# Patient Record
Sex: Male | Born: 1945 | Race: Black or African American | Hispanic: No | Marital: Single | Smoking: Never smoker
Health system: Southern US, Community
[De-identification: ages and names within clinical notes are randomized; demographics above are authoritative.]

## PROBLEM LIST (undated history)

## (undated) DIAGNOSIS — M109 Gout, unspecified: Secondary | ICD-10-CM

## (undated) DIAGNOSIS — I639 Cerebral infarction, unspecified: Secondary | ICD-10-CM

## (undated) DIAGNOSIS — K219 Gastro-esophageal reflux disease without esophagitis: Secondary | ICD-10-CM

## (undated) DIAGNOSIS — I2510 Atherosclerotic heart disease of native coronary artery without angina pectoris: Secondary | ICD-10-CM

## (undated) DIAGNOSIS — N183 Chronic kidney disease, stage 3 unspecified: Secondary | ICD-10-CM

## (undated) DIAGNOSIS — K4090 Unilateral inguinal hernia, without obstruction or gangrene, not specified as recurrent: Secondary | ICD-10-CM

## (undated) DIAGNOSIS — M1990 Unspecified osteoarthritis, unspecified site: Secondary | ICD-10-CM

## (undated) DIAGNOSIS — R011 Cardiac murmur, unspecified: Secondary | ICD-10-CM

## (undated) DIAGNOSIS — N281 Cyst of kidney, acquired: Secondary | ICD-10-CM

## (undated) DIAGNOSIS — Z8673 Personal history of transient ischemic attack (TIA), and cerebral infarction without residual deficits: Secondary | ICD-10-CM

## (undated) DIAGNOSIS — G4730 Sleep apnea, unspecified: Secondary | ICD-10-CM

## (undated) DIAGNOSIS — I428 Other cardiomyopathies: Secondary | ICD-10-CM

## (undated) DIAGNOSIS — I10 Essential (primary) hypertension: Secondary | ICD-10-CM

## (undated) DIAGNOSIS — I480 Paroxysmal atrial fibrillation: Secondary | ICD-10-CM

## (undated) DIAGNOSIS — I447 Left bundle-branch block, unspecified: Secondary | ICD-10-CM

## (undated) HISTORY — PX: COLONOSCOPY: SHX174

## (undated) HISTORY — DX: Unspecified osteoarthritis, unspecified site: M19.90

## (undated) HISTORY — PX: HERNIA REPAIR: SHX51

## (undated) HISTORY — DX: Cardiac murmur, unspecified: R01.1

---

## 1998-02-19 ENCOUNTER — Inpatient Hospital Stay (HOSPITAL_COMMUNITY): Admission: EM | Admit: 1998-02-19 | Discharge: 1998-02-21 | Payer: Self-pay | Admitting: Internal Medicine

## 1998-10-30 ENCOUNTER — Ambulatory Visit (HOSPITAL_COMMUNITY): Admission: RE | Admit: 1998-10-30 | Discharge: 1998-10-30 | Payer: Self-pay | Admitting: Family Medicine

## 1998-10-30 ENCOUNTER — Encounter: Payer: Self-pay | Admitting: Family Medicine

## 1998-12-01 ENCOUNTER — Encounter: Payer: Self-pay | Admitting: General Surgery

## 1998-12-01 ENCOUNTER — Ambulatory Visit (HOSPITAL_COMMUNITY): Admission: RE | Admit: 1998-12-01 | Discharge: 1998-12-01 | Payer: Self-pay | Admitting: General Surgery

## 2003-10-22 ENCOUNTER — Emergency Department (HOSPITAL_COMMUNITY): Admission: EM | Admit: 2003-10-22 | Discharge: 2003-10-22 | Payer: Self-pay | Admitting: Emergency Medicine

## 2006-05-16 ENCOUNTER — Emergency Department (HOSPITAL_COMMUNITY): Admission: EM | Admit: 2006-05-16 | Discharge: 2006-05-16 | Payer: Self-pay | Admitting: Emergency Medicine

## 2006-06-14 ENCOUNTER — Ambulatory Visit (HOSPITAL_COMMUNITY): Admission: RE | Admit: 2006-06-14 | Discharge: 2006-06-14 | Payer: Self-pay | Admitting: *Deleted

## 2006-06-17 ENCOUNTER — Inpatient Hospital Stay (HOSPITAL_BASED_OUTPATIENT_CLINIC_OR_DEPARTMENT_OTHER): Admission: RE | Admit: 2006-06-17 | Discharge: 2006-06-17 | Payer: Self-pay | Admitting: Cardiology

## 2009-01-06 ENCOUNTER — Emergency Department (HOSPITAL_COMMUNITY): Admission: EM | Admit: 2009-01-06 | Discharge: 2009-01-06 | Payer: Self-pay | Admitting: Emergency Medicine

## 2011-01-15 NOTE — Cardiovascular Report (Signed)
[name], [name]           ACCOUNT NO.:  [id number]   MEDICAL RECORD NO.:  [id number]          PATIENT TYPE:  OIB   LOCATION:  1965                         FACILITY:  [facility]   PHYSICIAN:  Peter M. Swaziland, M.D.  DATE OF BIRTH:  14-Dec-1945   DATE OF PROCEDURE:  DATE OF DISCHARGE:  06/17/2006                              CARDIAC CATHETERIZATION   INDICATIONS FOR PROCEDURE:  A 65 year old white male with a history of  hypertension and hypercholesterolemia presents predominant symptoms of  fatigue and dyspnea on exertion.  He had a stress Cardiolite study which was  suggestive for anterior apical ischemia.  He had normal left ventricular  function.   PROCEDURE NOTE:   PROCEDURES:  Left heart catheterization and coronary angiography.   EQUIPMENT USED:  A 4-French 4-cm right and left Judkins catheters, a 4-  French pigtail catheter, a 4-French arterial sheath.   MEDICATIONS:  Local anesthesia of 1% Xylocaine, Versed 2 mg IV, contrast 60  mL of Omnipaque.   HEMODYNAMIC DATA:  1. Aortic pressure was 114/67 with a mean of 88-mmHg.  2. Left ventricular pressures was 111 with EDP of 9-mmHg.   ANGIOGRAPHIC DATA:  1. Left coronary artery arises and distributes normally.  The left main      coronary is normal.  2. The left anterior descending artery is a large vessel which wraps      around the apex.  It is normal.  3. The left circumflex coronary is a codominant vessel and is also normal.  4. The right coronary is a relatively small codominant vessel and is also      normal.   LEFT VENTRICULAR ANGIOGRAPHY:  Was not performed due to the patient's  elevated creatinine.   FINAL INTERPRETATION:  1. Normal coronary anatomy.  2. Normal left ventricular filling pressure.   PLAN:  Recommend continued medical therapy.           ______________________________  Peter M. Swaziland, M.D.    PMJ/MEDQ  D:  06/17/2006  T:  06/18/2006  Job:  [id number]   cc:   Royetta Crochet, MD

## 2011-01-15 NOTE — H&P (Signed)
NAMEHORTON, ELLITHORPE           ACCOUNT NO.:  [id number]   MEDICAL RECORD NO.:  [id number]          PATIENT TYPE:  OUT   LOCATION:  PULM                         FACILITY:  [facility]   PHYSICIAN:  Peter M. Swaziland, M.D.  DATE OF BIRTH:  [birth year]   DATE OF ADMISSION:  06/17/2006  DATE OF DISCHARGE:                                HISTORY & PHYSICAL   HISTORY OF PRESENT ILLNESS:  Mr. Ragle is a very pleasant, 65 year old  black male who presents with symptoms of dyspnea and fatigue on exertion.  These symptoms have become acutely worse over the past two to three weeks.  He has also been treated for a sinus infection.  He has noticed some  epigastric discomfort noted when he does any lifting or bending over.  He  was subsequently referred for a stress Cardiolite study on June 10, 2006.  He was able to exercise for seven minutes on the Bruce protocol and was  limited at that time by severe dyspnea and leg fatigue, his heart rate at  that time did not reach his target heart rate and so we switched him to an  adenosine protocol.  His subsequent Cardiolite images demonstrated a  moderate anterior apical reversible defect consistent with ischemia, he had  normal left ventricular function with ejection fraction of 55%.  Given these  findings, it was recommended that we pursue diagnostic cardiac  catheterization.   PAST MEDICAL HISTORY:  1. History of hypertension.  2. Hypercholesterolemia.  3. He has had prior right inguinal hernia repair.  4. He has had a history of sinusitis.  5. Prostatism.   ALLERGIES:  He has no known allergies.   CURRENT MEDICATIONS:  Include:  1. Benicar/HCT 40/25 mg daily.  2. Flomax .4 mg daily.  3. Vytorin 10/40 mg daily.  4. Levitra p.r.n.  5. Aspirin 81 mg daily.   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  He is married, he has two healthy children.  He quit smoking  30 years ago and denies alcohol  use.   FAMILY HISTORY:  Father is age 25 in good health, mother is age 96 and has  hypertension, one brother has hypertension at age 32, one sister age 93 is  in good health.   REVIEW OF SYSTEMS:  He denies any claudication.  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  He denies any orthopnea, PND or increased edema.  He has had no  palpitations.  He has had no recent bowel or bladder complaints.  Other  review of systems are negative.   PHYSICAL EXAMINATION:  GENERAL:  On physical exam, patient is a pleasant  black male in no apparent distress.  VITAL SIGNS:  His weight is 232, blood  pressure is 122/72, pulse is 72 and regular.  HEENT EXAM:  He is normocephalic, atraumatic.  Pupils equal, round and  reactive to light and accommodation.  He does wears glasses.  Oropharynx is  clear.  NECK:  Supple without JVD, adenopathy, thyromegaly or bruits.  LUNGS:  Clear to auscultation and percussion.  CARDIAC EXAM:  Reveals regular rate and rhythm, normal S1-S2 without gallop,  murmur, rub or click.  ABDOMEN:  Soft, nontender without masses or hepatosplenomegaly.  EXTREMITIES:  Femoral and pedal pulses are 2+ and symmetric.  He has no  edema or cyanosis.  NEUROLOGICAL EXAM:  Nonfocal.   LABORATORY DATA:  Resting ECG shows normal sinus rhythm with occasional PVC,  otherwise normal.   Chest x-ray dated May 16, 2006 showed no active disease.   IMPRESSION:  1. Symptoms of dyspnea and fatigue on exertion with atypical chest pain.      Patient has an abnormal Cardiolite study showing evidence of anterior      apical ischemia.  2. Hypertension.  3. Hypercholesterolemia.  4. Prostatism.  5. History of sinusitis.   PLAN:  Will undergo diagnostic cardiac catheterization with further therapy  pending these results.            ______________________________  Peter M. [place]il[place], M.D.     PMJ/MEDQ  D:  06/15/2006  T:  06/15/2006  Job:  [id number]   cc:   Molly Maduro A. Nicholos Johns, M.D.

## 2011-03-01 ENCOUNTER — Emergency Department (HOSPITAL_COMMUNITY): Payer: 59

## 2011-03-01 ENCOUNTER — Emergency Department (HOSPITAL_COMMUNITY)
Admission: EM | Admit: 2011-03-01 | Discharge: 2011-03-01 | Disposition: A | Discharge status: Home / Self Care | Payer: 59 | Attending: Emergency Medicine | Admitting: Emergency Medicine

## 2011-03-01 DIAGNOSIS — M549 Dorsalgia, unspecified: Secondary | ICD-10-CM | POA: Insufficient documentation

## 2011-03-01 DIAGNOSIS — I10 Essential (primary) hypertension: Secondary | ICD-10-CM | POA: Insufficient documentation

## 2011-03-01 DIAGNOSIS — E7800 Pure hypercholesterolemia, unspecified: Secondary | ICD-10-CM | POA: Insufficient documentation

## 2011-03-01 DIAGNOSIS — M1990 Unspecified osteoarthritis, unspecified site: Secondary | ICD-10-CM | POA: Insufficient documentation

## 2013-06-07 ENCOUNTER — Emergency Department (HOSPITAL_COMMUNITY)
Admission: EM | Admit: 2013-06-07 | Discharge: 2013-06-08 | Disposition: A | Discharge status: Home / Self Care | Payer: Medicare Other | Attending: Emergency Medicine | Admitting: Emergency Medicine

## 2013-06-07 ENCOUNTER — Encounter (HOSPITAL_COMMUNITY): Payer: Self-pay | Admitting: Emergency Medicine

## 2013-06-07 DIAGNOSIS — R600 Localized edema: Secondary | ICD-10-CM

## 2013-06-07 DIAGNOSIS — R609 Edema, unspecified: Secondary | ICD-10-CM | POA: Insufficient documentation

## 2013-06-07 DIAGNOSIS — Z79899 Other long term (current) drug therapy: Secondary | ICD-10-CM | POA: Insufficient documentation

## 2013-06-07 DIAGNOSIS — I10 Essential (primary) hypertension: Secondary | ICD-10-CM | POA: Insufficient documentation

## 2013-06-07 DIAGNOSIS — Z792 Long term (current) use of antibiotics: Secondary | ICD-10-CM | POA: Insufficient documentation

## 2013-06-07 HISTORY — DX: Essential (primary) hypertension: I10

## 2013-06-07 NOTE — ED Notes (Addendum)
2-3+ pitting edema bilaterally. Pulses +2/+2 bilaterally. Pt denies cardiac Hx other than HTN

## 2013-06-07 NOTE — ED Notes (Signed)
Pt states that he was seen at his PCP yesterday and was given benadryl and cephalepin for a sore that is on his left foot. Pt states that he has been keeping his foot elevated and it still started swelling today.

## 2013-06-08 ENCOUNTER — Ambulatory Visit (HOSPITAL_COMMUNITY)
Admission: RE | Admit: 2013-06-08 | Discharge: 2013-06-08 | Disposition: A | Discharge status: Home / Self Care | Payer: Medicare Other | Source: Ambulatory Visit | Attending: Family Medicine | Admitting: Family Medicine

## 2013-06-08 ENCOUNTER — Other Ambulatory Visit (HOSPITAL_COMMUNITY): Payer: Self-pay | Admitting: Unknown Physician Specialty

## 2013-06-08 DIAGNOSIS — M7989 Other specified soft tissue disorders: Secondary | ICD-10-CM

## 2013-06-08 LAB — POCT I-STAT, CHEM 8
BUN: 20 mg/dL (ref 6–23)
Calcium, Ion: 1.16 mmol/L (ref 1.13–1.30)
Chloride: 101 mEq/L (ref 96–112)
Glucose, Bld: 106 mg/dL — ABNORMAL HIGH (ref 70–99)
HCT: 43 % (ref 39.0–52.0)
Potassium: 3.6 mEq/L (ref 3.5–5.1)

## 2013-06-08 MED ORDER — ENOXAPARIN SODIUM 120 MG/0.8ML ~~LOC~~ SOLN
1.0000 mg/kg | Freq: Once | SUBCUTANEOUS | Status: AC
  Administered 2013-06-08: 115 mg via SUBCUTANEOUS
  Filled 2013-06-08: qty 0.8

## 2013-06-08 NOTE — ED Provider Notes (Signed)
Medical screening examination/treatment/procedure(s) were conducted as a shared visit with non-physician practitioner(s) and myself.  I personally evaluated the patient during the encounter  L>R LE swelling and soreness, on exam has 2 plus pitting LE edema, calves NT, heart RRR,  lung CTA=. Labs reviewed elevated crt.  MED consult recs Ok to discharge, PT declines admit. Plan [place] and close outpatient follow up.   Sunnie Nielsen, MD 06/08/13 [phone]

## 2013-06-08 NOTE — Progress Notes (Signed)
*  PRELIMINARY RESULTS* Vascular Ultrasound Lower extremity venous duplex has been completed.  Preliminary findings: negative for DVT.   Farrel Demark, RDMS, RVT  06/08/2013, 4:18 PM

## 2013-06-08 NOTE — ED Provider Notes (Signed)
CSN: [id number]     Arrival date & time 06/07/13  2135 History   First MD Initiated Contact with Patient 06/07/13 2339     Chief Complaint  Patient presents with  . Foot Swelling   HPI  History provided by the patient. Patient is a 67 year old African American male with history of hypertension who presents with complaints of worsened and continued left lower extremity swelling. Patient reports first having some swelling of his lower legs and foot on Sunday. He was evaluated by PCP and given a prescription for Keflex and Benadryl. Patient states that he did have improvement of swelling after sleeping and elevating his feet the swelling returned the next day. This evening he reports even worsened swelling of the lower leg with slight ache and soreness. He denies any other specific aggravating or alleviating factors. He denies any skin changes or rash. Denies any fever, chills or sweats. Denies any chest pain or shortness of breath. He reports no urinary changes. No back or flank pains. No recent long travel. No cough or hemoptysis. No history of active cancer. No prior DVT or PEs. No other associated symptoms.    Past Medical History  Diagnosis Date  . Hypertension    History reviewed. No pertinent past surgical history. History reviewed. No pertinent family history. History  Substance Use Topics  . Smoking status: Never Smoker   . Smokeless tobacco: Not on file  . Alcohol Use: No    Review of Systems  Constitutional: Negative for fever, chills and diaphoresis.  Respiratory: Negative for cough and shortness of breath.   Cardiovascular: Positive for leg swelling. Negative for chest pain.  Gastrointestinal: Negative for vomiting and diarrhea.  Skin: Negative for rash.  All other systems reviewed and are negative.    Allergies  Review of patient's allergies indicates no known allergies.  Home Medications   Current Outpatient Rx  Name  Route  Sig  Dispense  Refill  . amLODipine  (NORVASC) 10 MG tablet   Oral   Take 10 mg by mouth daily.         . cephALEXin (KEFLEX) 500 MG capsule   Oral   Take 500 mg by mouth 2 (two) times daily.         . diphenhydrAMINE (BENADRYL) 25 mg capsule   Oral   Take 25 mg by mouth every 6 (six) hours as needed for itching.         . dutasteride (AVODART) 0.5 MG capsule   Oral   Take 0.5 mg by mouth daily.         Marland Kitchen ezetimibe (ZETIA) 10 MG tablet   Oral   Take 10 mg by mouth daily.         Marland Kitchen olmesartan-hydrochlorothiazide (BENICAR HCT) 40-25 MG per tablet   Oral   Take 1 tablet by mouth daily.         . tamsulosin (FLOMAX) 0.4 MG CAPS capsule   Oral   Take 0.4 mg by mouth daily.          BP 160/76  Pulse 87  Temp(Src) 98.2 F (36.8 C) (Oral)  Resp 16  Ht 5\' 9"  (1.753 m)  Wt 250 lb (113.399 kg)  BMI 36.9 kg/m2  SpO2 97% Physical Exam  Nursing note and vitals reviewed. Constitutional: He is oriented to person, place, and time. He appears well-developed and well-nourished. No distress.  HENT:  Head: Normocephalic and atraumatic.  Neck: Normal range of motion. Neck supple. No JVD present.  Cardiovascular: Normal rate and regular rhythm.   No murmur heard. Pulmonary/Chest: Effort normal and breath sounds normal. No respiratory distress. He has no wheezes. He has no rales.  Abdominal: Soft.  Musculoskeletal: He exhibits edema.  Moderate swelling to bilateral lower feet and lower extremities with 2+ pitting edema. Swelling does extend higher on the left lower extremity. There is no significant tenderness. No pain or tenderness along the calf. Normal dorsal pedal pulses and sensation in the foot. Skin normal without erythema or induration.  Neurological: He is alert and oriented to person, place, and time.  Skin: Skin is warm.  Psychiatric: He has a normal mood and affect. His behavior is normal.    ED Course  Procedures  DIAGNOSTIC STUDIES: Oxygen Saturation is 97% on room air.    COORDINATION OF  CARE:  Nursing notes reviewed. Vital signs reviewed. Initial pt interview and examination performed.   Patient with no previous lab testing in the computer system. Patient today has elevated creatinine level of 2.0. He reports no knowledge of any history of kidney insufficiency. Patient's case and lab findings were discussed with attending physician. At this time with his acute lower x-ray swelling is unclear if he may be having some acute renal problems or if this is his baseline. Plan to discuss the case with internal medicine.  I spoke with Dr. Toniann Fail with [facility] hospital[facility]. He does not necessarily recommend any adjustments of patient's blood pressure medications at this time. He does feel patient will require close repeat testing of BUN and creatinine. Patient may also benefit from outpatient cardiac workup to rule out CHF. Currently patient has no clinical concerns for CHF. Given that patient has slightly worsened swelling of the left lower extremity there may be possible concerns for DVT and we will give dose of Lovenox and schedule followup vascular ultrasound.  I did discuss the findings with the patient and discussed options for possible admission for further evaluation. Patient does not wish to stay in the hospital. He does followup with Dr. Nicholos Johns and wishes to have further testing outpatient. He is in agreement to receive Lovenox here and followup later in the morning for a vascular Doppler ultrasounds of lower legs. I did give strict return precautions as well as risks for worsening condition. Patient expressed his understanding.   Treatment plan initiated:  Medications  enoxaparin (LOVENOX) injection 115 mg (115 mg Subcutaneous Given 06/08/13 0210)       Results for orders placed during the hospital encounter of 06/07/13  POCT I-STAT, CHEM 8      Result Value Range   Sodium 140  135 - 145 mEq/L   Potassium 3.6  3.5 - 5.1 mEq/L   Chloride 101  96 - 112 mEq/L   BUN 20  6 - 23  mg/dL   Creatinine, Ser 7.82 (*) 0.50 - 1.35 mg/dL   Glucose, Bld 956 (*) 70 - 99 mg/dL   Calcium, Ion 2.13  0.86 - 1.30 mmol/L   TCO2 27  0 - 100 mmol/L   Hemoglobin 14.6  13.0 - 17.0 g/dL   HCT 57.8  46.9 - 62.9 %       MDM   1. Lower extremity edema       Angus Seller, PA-C 06/08/13 [id number]

## 2013-08-20 ENCOUNTER — Ambulatory Visit
Admission: RE | Admit: 2013-08-20 | Discharge: 2013-08-20 | Disposition: A | Discharge status: Home / Self Care | Payer: Medicare Other | Source: Ambulatory Visit | Attending: Family Medicine | Admitting: Family Medicine

## 2013-08-20 ENCOUNTER — Other Ambulatory Visit: Payer: Self-pay | Admitting: Family Medicine

## 2013-08-20 DIAGNOSIS — S8002XS Contusion of left knee, sequela: Secondary | ICD-10-CM

## 2013-08-20 DIAGNOSIS — T1490XA Injury, unspecified, initial encounter: Secondary | ICD-10-CM

## 2013-08-20 DIAGNOSIS — R52 Pain, unspecified: Secondary | ICD-10-CM

## 2013-08-23 ENCOUNTER — Emergency Department (HOSPITAL_COMMUNITY)
Admission: EM | Admit: 2013-08-23 | Discharge: 2013-08-23 | Disposition: A | Discharge status: Home / Self Care | Payer: Medicare Other | Source: Home / Self Care

## 2013-08-23 ENCOUNTER — Ambulatory Visit (INDEPENDENT_AMBULATORY_CARE_PROVIDER_SITE_OTHER): Payer: Medicare Other | Admitting: Family Medicine

## 2013-08-23 ENCOUNTER — Ambulatory Visit (HOSPITAL_COMMUNITY)
Admit: 2013-08-23 | Discharge: 2013-08-23 | Disposition: A | Discharge status: Home / Self Care | Payer: Medicare Other | Attending: Family Medicine | Admitting: Family Medicine

## 2013-08-23 VITALS — BP 155/87 | HR 71 | Temp 98.2°F | Resp 16 | Ht 69.0 in | Wt 239.0 lb

## 2013-08-23 DIAGNOSIS — R51 Headache: Secondary | ICD-10-CM | POA: Insufficient documentation

## 2013-08-23 DIAGNOSIS — R42 Dizziness and giddiness: Secondary | ICD-10-CM | POA: Insufficient documentation

## 2013-08-23 NOTE — Progress Notes (Signed)
[facility] [address] [place] [address] [phone]  Date:  08/23/2013   Name:  [name] [name]   DOB:  1946-05-20   MRN:  [id number]  PCP:  Lolita Patella, MD    Chief Complaint: Headache   History of Present Illness:  [name] [name] is a 67 y.o. very pleasant male patient who presents with the following:  He is here today with headaches.  He noted onset yesterday.  He noted it when he woke up and it is sill there today His PCP is Dr. Nicholos Johns - he saw him on Monday and was started on prednisone and vicodin for a knee pain.  He fell a few days ago and hurt his knee.   He thinks that he got a virus just over a week ago after going to the grocery store.  He noted that "everything started spinning around" which is "how I know I got a germ virus."  To treat this he took some dulcolax to "clean myself out."   He has not taken any medication since yesterday.  He did not take his prednsisone or vicodin today because he was not sure if he should He notes pain when he bends down.  He has pain in the front of his head, in his sinuses He noted a subjective fever a week ago, now resolved  He states he had a CT "a long time ago" of his head- in fact he has had 2 CTs of his head in 2007 and 2010 for severe HA.  He did not seem to remember about these and states that "I never get headaches."  Asked in detail about his current HA and he does feel it is an unusual and severe HA for him, and he would like to pursue imaging of his head   He has not noted any weakness, numbness, hearing or vision change.  He did feel off balance over a week ago "when I had that germ virus" but this is now resolved  He has not taken his BP medications yet today "because I was afraid to."  There are no active problems to display for this patient.   Past Medical History  Diagnosis Date  . Hypertension     No past surgical history on file.  History  Substance Use Topics   . Smoking status: Never Smoker   . Smokeless tobacco: Not on file  . Alcohol Use: No    No family history on file.  No Known Allergies  Medication list has been reviewed and updated.  Current Outpatient Prescriptions on File Prior to Visit  Medication Sig Dispense Refill  . diphenhydrAMINE (BENADRYL) 25 mg capsule Take 25 mg by mouth every 6 (six) hours as needed for itching.      . ezetimibe (ZETIA) 10 MG tablet Take 10 mg by mouth daily.      Marland Kitchen olmesartan-hydrochlorothiazide (BENICAR HCT) 40-25 MG per tablet Take 1 tablet by mouth daily.      . tamsulosin (FLOMAX) 0.4 MG CAPS capsule Take 0.4 mg by mouth daily.      Marland Kitchen amLODipine (NORVASC) 10 MG tablet Take 10 mg by mouth daily.      . cephALEXin (KEFLEX) 500 MG capsule Take 500 mg by mouth 2 (two) times daily.      Marland Kitchen dutasteride (AVODART) 0.5 MG capsule Take 0.5 mg by mouth daily.       No current facility-administered medications on file prior to visit.  Review of Systems:  As per HPI- otherwise negative.   Physical Examination: Filed Vitals:   08/23/13 1225  BP: 155/87  Pulse: 71  Temp: 98.2 F (36.8 C)  Resp: 16   Filed Vitals:   08/23/13 1225  Height: 5\' 9"  (1.753 m)  Weight: 239 lb (108.41 kg)   Body mass index is 35.28 kg/(m^2). Ideal Body Weight: Weight in (lb) to have BMI = 25: 168.9  GEN: WDWN, NAD, Non-toxic, A & O x 3, obese, looks well HEENT: Atraumatic, Normocephalic. Neck supple. No masses, No LAD.  Bilateral TM wnl, oropharynx normal.  PEERL,EOMI.   Ears and Nose: No external deformity. CV: RRR, No M/G/R. No JVD. No thrill. No extra heart sounds. PULM: CTA B, no wheezes, crackles, rhonchi. No retractions. No resp. distress. No accessory muscle use. ABD: S, NT, ND EXTR: No c/c/e NEURO Normal gait. Neuro exam WNL- normal strength, sensation and DTR all extremities.  Normal romberg.  PSYCH: Normally interactive. Conversant. Not depressed or anxious appearing.  Calm demeanor.    Assessment and  Plan: Headache(784.0) - Plan: CT Head Wo Contrast  [name] [name] is here today with what he states is a very severe and unusual headache.  He gives a rather confusing history, but it seems he was ill a week ago, then saw his PCP on Monday (today is Thursday) and was given prednisone and vicodin for a knee problem.  Today he has complaint of HA which is very worrisome to him, and he would like to have imaging done today  He had had 2 head CTs in the past- the history for both states HA.  His earlier CT in 2007 does show a possible infarct of undetermined age.  Will send him for a CT of his head and call him with results later this evening  Signed Abbe Amsterdam, MD  Sent for CT head today: CT HEAD WITHOUT CONTRAST  TECHNIQUE: Contiguous axial images were obtained from the base of the skull through the vertex without intravenous contrast.  COMPARISON: Head CT 01/06/2009  FINDINGS: The ventricles and sulci are within normal limits for age. There is no evidence of acute infarct, intracranial hemorrhage, mass, midline shift, or extra-axial collection. The orbits are unremarkable. The visualized paranasal sinuses and mastoid air cells are clear. There is no evidence of acute fracture.  IMPRESSION: Unremarkable head CT.  Called and let him know Ct is negative.  He seems to have complaint most of sinus pressure.  Advised him that the prednisone will actually be good for sinus pressure, and his pain medication is likely to help his HA as well.  Asked him to please let us know if he has any other concerns or if his HA does not get better soon

## 2013-08-23 NOTE — Patient Instructions (Signed)
I will give you a call regarding the results of your head CT when they come in later today.

## 2013-08-27 ENCOUNTER — Emergency Department (HOSPITAL_COMMUNITY)
Admission: EM | Admit: 2013-08-27 | Discharge: 2013-08-27 | Disposition: A | Discharge status: Home / Self Care | Payer: Medicare Other | Attending: Emergency Medicine | Admitting: Emergency Medicine

## 2013-08-27 ENCOUNTER — Encounter (HOSPITAL_COMMUNITY): Payer: Self-pay | Admitting: Emergency Medicine

## 2013-08-27 DIAGNOSIS — M531 Cervicobrachial syndrome: Secondary | ICD-10-CM | POA: Insufficient documentation

## 2013-08-27 DIAGNOSIS — IMO0002 Reserved for concepts with insufficient information to code with codable children: Secondary | ICD-10-CM | POA: Insufficient documentation

## 2013-08-27 DIAGNOSIS — R112 Nausea with vomiting, unspecified: Secondary | ICD-10-CM | POA: Insufficient documentation

## 2013-08-27 DIAGNOSIS — M109 Gout, unspecified: Secondary | ICD-10-CM | POA: Insufficient documentation

## 2013-08-27 DIAGNOSIS — Z791 Long term (current) use of non-steroidal anti-inflammatories (NSAID): Secondary | ICD-10-CM | POA: Insufficient documentation

## 2013-08-27 DIAGNOSIS — M25469 Effusion, unspecified knee: Secondary | ICD-10-CM | POA: Insufficient documentation

## 2013-08-27 DIAGNOSIS — R51 Headache: Secondary | ICD-10-CM | POA: Insufficient documentation

## 2013-08-27 DIAGNOSIS — R519 Headache, unspecified: Secondary | ICD-10-CM

## 2013-08-27 DIAGNOSIS — G44209 Tension-type headache, unspecified, not intractable: Secondary | ICD-10-CM

## 2013-08-27 DIAGNOSIS — R42 Dizziness and giddiness: Secondary | ICD-10-CM | POA: Insufficient documentation

## 2013-08-27 DIAGNOSIS — I10 Essential (primary) hypertension: Secondary | ICD-10-CM | POA: Insufficient documentation

## 2013-08-27 DIAGNOSIS — M5481 Occipital neuralgia: Secondary | ICD-10-CM

## 2013-08-27 DIAGNOSIS — Z79899 Other long term (current) drug therapy: Secondary | ICD-10-CM | POA: Insufficient documentation

## 2013-08-27 MED ORDER — METHOCARBAMOL 500 MG PO TABS: 500.0000 mg | ORAL_TABLET | Freq: Three times a day (TID) | ORAL | Status: DC | PRN

## 2013-08-27 MED ORDER — NAPROXEN 500 MG PO TABS: 500.0000 mg | ORAL_TABLET | Freq: Two times a day (BID) | ORAL | Status: DC

## 2013-08-27 NOTE — ED Provider Notes (Signed)
CSN: [id number]     Arrival date & time 08/27/13  1111 History   First MD Initiated Contact with Patient 08/27/13 1216     Chief Complaint  Patient presents with  . Headache      HPI  Patient presents with a chief complaint of a headache. About 10 days ago he had an episode of what sounds like vertigo. His room was spinning. He vomited twice. Next they went to see his physician because he fell hurt his knee. His vertigo and nausea had resolved. He did not have headache. He has some swelling in his knee. He is placed on prednisone because of his history of gout, and given oxycodone. He developed headache within 24 hours, thus he stopped the prednisone and hydrocodone. Headache has continued it is been present daily. He was seen in the outpatient urgent care center and had an outpatient CT scan obtained 2 days ago that was normal. His headache persists. He describes it as a headache primarily on the right side. His reproducible with palpation of the back of his head. No vision changes. No difficult with swallowing vision no additional vertigo. No sinus pain pressure congestion or fever.  Past Medical History  Diagnosis Date  . Hypertension    History reviewed. No pertinent past surgical history. History reviewed. No pertinent family history. History  Substance Use Topics  . Smoking status: Never Smoker   . Smokeless tobacco: Not on file  . Alcohol Use: No    Review of Systems  Constitutional: Negative for fever, chills, diaphoresis, appetite change and fatigue.  HENT: Negative for mouth sores, sore throat and trouble swallowing.   Eyes: Negative for visual disturbance.  Respiratory: Negative for cough, chest tightness, shortness of breath and wheezing.   Cardiovascular: Negative for chest pain.  Gastrointestinal: Positive for nausea and vomiting. Negative for abdominal pain, diarrhea and abdominal distention.  Endocrine: Negative for polydipsia, polyphagia and polyuria.    Genitourinary: Negative for dysuria, frequency and hematuria.  Musculoskeletal: Negative for gait problem.  Skin: Negative for color change, pallor and rash.  Neurological: Positive for dizziness and headaches. Negative for syncope and light-headedness.  Hematological: Does not bruise/bleed easily.  Psychiatric/Behavioral: Negative for behavioral problems and confusion.    Allergies  Review of patient's allergies indicates no known allergies.  Home Medications   Current Outpatient Rx  Name  Route  Sig  Dispense  Refill  . allopurinol (ZYLOPRIM) 300 MG tablet   Oral   Take 300 mg by mouth daily.         . bisacodyl (DULCOLAX) 5 MG EC tablet   Oral   Take 5 mg by mouth daily as needed for moderate constipation.         . colchicine 0.6 MG tablet   Oral   Take 0.6 mg by mouth daily as needed (for gout).          Marland Kitchen diphenhydrAMINE (BENADRYL) 25 mg capsule   Oral   Take 25 mg by mouth every 6 (six) hours as needed for itching.         . ezetimibe (ZETIA) 10 MG tablet   Oral   Take 10 mg by mouth daily.         Marland Kitchen HYDROcodone-acetaminophen (NORCO/VICODIN) 5-325 MG per tablet   Oral   Take 1-2 tablets by mouth every 6 (six) hours as needed for moderate pain.         Marland Kitchen olmesartan-hydrochlorothiazide (BENICAR HCT) 40-25 MG per tablet   Oral  Take 1 tablet by mouth daily.         . predniSONE (DELTASONE) 20 MG tablet   Oral   Take 20 mg by mouth daily with breakfast. 3-3-3-2-2-2-1-1-1         . tamsulosin (FLOMAX) 0.4 MG CAPS capsule   Oral   Take 0.4 mg by mouth daily.         . methocarbamol (ROBAXIN) 500 MG tablet   Oral   Take 1 tablet (500 mg total) by mouth 3 (three) times daily between meals as needed.   20 tablet   0   . naproxen (NAPROSYN) 500 MG tablet   Oral   Take 1 tablet (500 mg total) by mouth 2 (two) times daily.   30 tablet   0    BP 141/94  Pulse 70  Temp(Src) 98.1 F (36.7 C) (Oral)  Resp 16  Wt 243 lb 3.2 oz (110.315  kg)  SpO2 96% Physical Exam  Constitutional: He is oriented to person, place, and time. He appears well-developed and well-nourished. No distress.  HENT:  Head: Normocephalic.    There is tenderness in the right posterior inferior occiput reproduces his headache was palpated. Point tender.  Normal posterior pharynx.  Eyes: Conjunctivae are normal. Pupils are equal, round, and reactive to light. No scleral icterus.  Neck: Normal range of motion. Neck supple. No thyromegaly present.  Supple neck. No tenderness in the posterior neck.  Cardiovascular: Normal rate and regular rhythm.  Exam reveals no gallop and no friction rub.   No murmur heard. Pulmonary/Chest: Effort normal and breath sounds normal. No respiratory distress. He has no wheezes. He has no rales.  Normal bilateral breast exam. He does not have mass or fullness. No nipple or skin changes. No adenopathy neck supple.  Abdominal: Soft. Bowel sounds are normal. He exhibits no distension. There is no tenderness. There is no rebound.  Musculoskeletal: Normal range of motion.  Neurological: He is alert and oriented to person, place, and time.  Skin: Skin is warm and dry. No rash noted.  Psychiatric: He has a normal mood and affect. His behavior is normal.    ED Course  Procedures (including critical care time) Labs Review Labs Reviewed - No data to display Imaging Review No results found.  EKG Interpretation   None       MDM   1. Headache   2. Muscle tension headache   3. Occipital neuralgia    Patient has a headache that is reproducible with palpation of his right posterior occiput. His given a local injection of 5 cc of 2% lidocaine. And 10 minutes his headache is completely resolved. This is consistent with an occipital neuralgia. Some component of the muscle tension headache as well. His gases a sensation of a tickle in his throat. His normal pharyngeal exam. Not his reflux symptoms. He is on an ACE inhibitor.  Vascular discuss this with his physician as as being a potential cause. He also describes sensation of tenderness in his right breast for several months. His normal breast exam without masses or pneumonia. Have asked him to discuss this  with his physician. At this this is a potential side effect.  His headache he'll be placed on some naproxen and Robaxin.    Rolland Porter, MD 08/27/13 1322

## 2013-08-27 NOTE — ED Notes (Signed)
Pt states he's had a headache and a feeling that "my body doesn't feel right" for several days.  Pt seen at [facility] on 12/25.  Per pt they did a CT, but found nothing wrong.  Pt presents today with continued symptoms and states "something is wrong".  Pt alert and oriented and in NAD at this time.

## 2013-08-27 NOTE — ED Notes (Signed)
PT reports one episode of vertigo on 08/15/13 with fall, skinning L knee and L great toe. PT denies further episodes of vertigo. PT reports emesis x1 on 08/15/13. PT reports HA onset on 08/20/13 and states he began to take prednisone for injury to knee and great toe. PT saw primary care on 12/22 and was x-ray'd and pain medication for knee and toe. PT returned to primary care on 12/24 and was sent to [facility] hospital for CT scan. PT was given pain medication for mgmt of HA. PT reports HA 7/10 at temples. PT also reports feeling "something in throat" after having eaten dinner last night. PT also mentioned that he has noticed gynecomastia with onset in June/July 2014 and R breast tenderness.

## 2013-09-13 ENCOUNTER — Ambulatory Visit
Admission: RE | Admit: 2013-09-13 | Discharge: 2013-09-13 | Disposition: A | Discharge status: Home / Self Care | Payer: Medicare Other | Source: Ambulatory Visit | Attending: Family Medicine | Admitting: Family Medicine

## 2013-09-13 ENCOUNTER — Other Ambulatory Visit: Payer: Self-pay | Admitting: Family Medicine

## 2013-09-13 DIAGNOSIS — M79609 Pain in unspecified limb: Secondary | ICD-10-CM

## 2014-10-29 ENCOUNTER — Other Ambulatory Visit: Payer: Self-pay | Admitting: Family Medicine

## 2014-10-29 ENCOUNTER — Ambulatory Visit
Admission: RE | Admit: 2014-10-29 | Discharge: 2014-10-29 | Disposition: A | Discharge status: Home / Self Care | Payer: Medicare Other | Source: Ambulatory Visit | Attending: Family Medicine | Admitting: Family Medicine

## 2014-10-29 DIAGNOSIS — M545 Low back pain: Secondary | ICD-10-CM

## 2014-11-30 ENCOUNTER — Encounter (HOSPITAL_COMMUNITY): Payer: Self-pay | Admitting: *Deleted

## 2014-11-30 ENCOUNTER — Emergency Department (HOSPITAL_COMMUNITY)
Admission: EM | Admit: 2014-11-30 | Discharge: 2014-11-30 | Disposition: A | Discharge status: Home / Self Care | Payer: Medicare Other | Attending: Emergency Medicine | Admitting: Emergency Medicine

## 2014-11-30 ENCOUNTER — Emergency Department (HOSPITAL_COMMUNITY): Payer: Medicare Other

## 2014-11-30 DIAGNOSIS — I10 Essential (primary) hypertension: Secondary | ICD-10-CM | POA: Insufficient documentation

## 2014-11-30 DIAGNOSIS — R05 Cough: Secondary | ICD-10-CM | POA: Diagnosis present

## 2014-11-30 DIAGNOSIS — J3489 Other specified disorders of nose and nasal sinuses: Secondary | ICD-10-CM | POA: Diagnosis not present

## 2014-11-30 DIAGNOSIS — R600 Localized edema: Secondary | ICD-10-CM | POA: Insufficient documentation

## 2014-11-30 DIAGNOSIS — R509 Fever, unspecified: Secondary | ICD-10-CM | POA: Diagnosis not present

## 2014-11-30 DIAGNOSIS — R059 Cough, unspecified: Secondary | ICD-10-CM

## 2014-11-30 DIAGNOSIS — Z79899 Other long term (current) drug therapy: Secondary | ICD-10-CM | POA: Insufficient documentation

## 2014-11-30 LAB — CBC WITH DIFFERENTIAL/PLATELET
Basophils Absolute: 0 10*3/uL (ref 0.0–0.1)
Basophils Relative: 0 % (ref 0–1)
EOS ABS: 0.2 10*3/uL (ref 0.0–0.7)
Eosinophils Relative: 4 % (ref 0–5)
HEMATOCRIT: 39.7 % (ref 39.0–52.0)
HEMOGLOBIN: 13 g/dL (ref 13.0–17.0)
LYMPHS ABS: 2.1 10*3/uL (ref 0.7–4.0)
LYMPHS PCT: 40 % (ref 12–46)
MCH: 29.2 pg (ref 26.0–34.0)
MCHC: 32.7 g/dL (ref 30.0–36.0)
MCV: 89.2 fL (ref 78.0–100.0)
Monocytes Absolute: 0.8 10*3/uL (ref 0.1–1.0)
Monocytes Relative: 14 % — ABNORMAL HIGH (ref 3–12)
NEUTROS ABS: 2.2 10*3/uL (ref 1.7–7.7)
NEUTROS PCT: 42 % — AB (ref 43–77)
Platelets: 205 10*3/uL (ref 150–400)
RBC: 4.45 MIL/uL (ref 4.22–5.81)
RDW: 14.5 % (ref 11.5–15.5)
WBC: 5.3 10*3/uL (ref 4.0–10.5)

## 2014-11-30 LAB — COMPREHENSIVE METABOLIC PANEL
ALK PHOS: 49 U/L (ref 39–117)
ALT: 12 U/L (ref 0–53)
ANION GAP: 9 (ref 5–15)
AST: 22 U/L (ref 0–37)
Albumin: 3.3 g/dL — ABNORMAL LOW (ref 3.5–5.2)
BILIRUBIN TOTAL: 0.5 mg/dL (ref 0.3–1.2)
BUN: 15 mg/dL (ref 6–23)
CO2: 27 mmol/L (ref 19–32)
Calcium: 8.4 mg/dL (ref 8.4–10.5)
Chloride: 99 mmol/L (ref 96–112)
Creatinine, Ser: 1.67 mg/dL — ABNORMAL HIGH (ref 0.50–1.35)
GFR calc Af Amer: 47 mL/min — ABNORMAL LOW (ref >90)
GFR, EST NON AFRICAN AMERICAN: 41 mL/min — AB (ref >90)
GLUCOSE: 89 mg/dL (ref 70–99)
POTASSIUM: 3.7 mmol/L (ref 3.5–5.1)
SODIUM: 135 mmol/L (ref 135–145)
Total Protein: 6.5 g/dL (ref 6.0–8.3)

## 2014-11-30 MED ORDER — AZITHROMYCIN 250 MG PO TABS: 250.0000 mg | ORAL_TABLET | Freq: Every day | ORAL | Status: DC

## 2014-11-30 MED ORDER — HYDROCODONE-HOMATROPINE 5-1.5 MG/5ML PO SYRP: 5.0000 mL | ORAL_SOLUTION | Freq: Four times a day (QID) | ORAL | Status: DC | PRN

## 2014-11-30 NOTE — Discharge Instructions (Signed)
1. Medications: hycodan, azithromycin, mucinex, usual home medications 2. Treatment: rest, drink plenty of fluids, take tylenol or ibuprofen for fever control 3. Follow Up: Please followup with your primary doctor in 3 days for discussion of your diagnoses and further evaluation after today's visit; if you do not have a primary care doctor use the resource guide provided to find one; Return to the ER for high fevers, difficulty breathing or other concerning symptoms

## 2014-11-30 NOTE — ED Provider Notes (Signed)
CSN: [id number]     Arrival date & time 11/30/14  1646 History   First MD Initiated Contact with Patient 11/30/14 1742     Chief Complaint  Patient presents with  . Cough     (Consider location/radiation/quality/duration/timing/severity/associated sxs/prior Treatment) Patient is a 69 y.o. male presenting with cough. The history is provided by the patient and medical records. No language interpreter was used.  Cough Associated symptoms: chest pain ( only with cough) and fever   Associated symptoms: no diaphoresis, no headaches, no rash, no shortness of breath and no wheezing      ZAYDE STROUPE is a 69 y.o. male  with a hx of HTN presents to the Emergency Department complaining of gradual, persistent, progressively worsening cough with associated brown sputum onset last night.  Pt reports his wife is sick with similar symptoms.  He reports receiving his flu and PNA shot this year.  He reports no treatments PTA and no aggravating or alleviating factors.  Patient denies leg swelling, recent travel, recent surgery or fracture, hemoptysis or exogenous estrogen usage. Pt denies chills, headache, neck pain, neck stiffness, abdominal pain, nausea, vomiting, diarrhea, weakness, dizziness, syncope, dysuria.     Past Medical History  Diagnosis Date  . Hypertension    History reviewed. No pertinent past surgical history. No family history on file. History  Substance Use Topics  . Smoking status: Never Smoker   . Smokeless tobacco: Not on file  . Alcohol Use: No    Review of Systems  Constitutional: Positive for fever. Negative for diaphoresis, appetite change, fatigue and unexpected weight change.  HENT: Negative for mouth sores.   Eyes: Negative for visual disturbance.  Respiratory: Positive for cough. Negative for chest tightness, shortness of breath and wheezing.   Cardiovascular: Positive for chest pain ( only with cough).  Gastrointestinal: Negative for nausea, vomiting, abdominal  pain, diarrhea and constipation.  Endocrine: Negative for polydipsia, polyphagia and polyuria.  Genitourinary: Negative for dysuria, urgency, frequency and hematuria.  Musculoskeletal: Negative for back pain and neck stiffness.  Skin: Negative for rash.  Allergic/Immunologic: Negative for immunocompromised state.  Neurological: Negative for syncope, light-headedness and headaches.  Hematological: Does not bruise/bleed easily.  Psychiatric/Behavioral: Negative for sleep disturbance. The patient is not nervous/anxious.       Allergies  Review of patient's allergies indicates no known allergies.  Home Medications   Prior to Admission medications   Medication Sig Start Date End Date Taking? Authorizing Provider  allopurinol (ZYLOPRIM) 300 MG tablet Take 300 mg by mouth daily.   Yes Historical Provider, MD  amLODipine (NORVASC) 10 MG tablet Take 10 mg by mouth daily. 09/13/14  Yes Historical Provider, MD  Colesevelam HCl (WELCHOL PO) Take 1 tablet by mouth daily as needed (high cholesterol).   Yes Historical Provider, MD  dutasteride (AVODART) 0.5 MG capsule Take 0.5 mg by mouth at bedtime.  10/29/14  Yes Historical Provider, MD  irbesartan (AVAPRO) 300 MG tablet Take 300 mg by mouth daily. 09/13/14  Yes Historical Provider, MD  pravastatin (PRAVACHOL) 40 MG tablet Take 40 mg by mouth at bedtime.  09/13/14  Yes Historical Provider, MD  PREDNISONE, PAK, PO Take 1 tablet by mouth daily. 5th day of dose pak   Yes Historical Provider, MD  tamsulosin (FLOMAX) 0.4 MG CAPS capsule Take 0.4 mg by mouth daily.   Yes Historical Provider, MD  triamcinolone (NASACORT) 55 MCG/ACT AERO nasal inhaler Place 2 sprays into the nose at bedtime as needed (congestion).   Yes  Historical Provider, MD  azithromycin (ZITHROMAX) 250 MG tablet Take 1 tablet (250 mg total) by mouth daily. Take first 2 tablets together, then 1 every day until finished. 11/30/14   Deaundra Kutzer, PA-C  bisacodyl (DULCOLAX) 5 MG EC tablet  Take 5 mg by mouth daily as needed for moderate constipation.    Historical Provider, MD  colchicine 0.6 MG tablet Take 0.6 mg by mouth daily as needed (for gout).     Historical Provider, MD  diphenhydrAMINE (BENADRYL) 25 mg capsule Take 25 mg by mouth every 6 (six) hours as needed for itching.    Historical Provider, MD  HYDROcodone-homatropine (HYCODAN) 5-1.5 MG/5ML syrup Take 5 mLs by mouth every 6 (six) hours as needed for cough. 11/30/14   Jahni Nazar, PA-C  methocarbamol (ROBAXIN) 500 MG tablet Take 1 tablet (500 mg total) by mouth 3 (three) times daily between meals as needed. Patient taking differently: Take 500 mg by mouth 3 (three) times daily between meals as needed for muscle spasms.  08/27/13   Tanna Furry, MD  naproxen (NAPROSYN) 500 MG tablet Take 1 tablet (500 mg total) by mouth 2 (two) times daily. Patient not taking: Reported on 11/30/2014 08/27/13   Tanna Furry, MD   BP 144/79 mmHg  Pulse 64  Temp(Src) 98.4 F (36.9 C) (Oral)  Resp 16  SpO2 97% Physical Exam  Constitutional: He is oriented to person, place, and time. He appears well-developed and well-nourished. No distress.  Awake, alert, nontoxic appearance  HENT:  Head: Normocephalic and atraumatic.  Right Ear: Tympanic membrane, external ear and ear canal normal.  Left Ear: Tympanic membrane, external ear and ear canal normal.  Nose: Mucosal edema and rhinorrhea present. No epistaxis. Right sinus exhibits no maxillary sinus tenderness and no frontal sinus tenderness. Left sinus exhibits no maxillary sinus tenderness and no frontal sinus tenderness.  Mouth/Throat: Uvula is midline, oropharynx is clear and moist and mucous membranes are normal. Mucous membranes are not pale and not cyanotic. No oropharyngeal exudate, posterior oropharyngeal edema, posterior oropharyngeal erythema or tonsillar abscesses.  Eyes: Conjunctivae are normal. Pupils are equal, round, and reactive to light. No scleral icterus.  Neck: Normal  range of motion and full passive range of motion without pain. Neck supple.  Cardiovascular: Normal rate, regular rhythm and intact distal pulses.   Pulmonary/Chest: Effort normal and breath sounds normal. No stridor. No respiratory distress. He has no wheezes.  Equal chest expansion Congested cough  Abdominal: Soft. Bowel sounds are normal. He exhibits no mass. There is no tenderness. There is no rebound and no guarding.  Musculoskeletal: Normal range of motion. He exhibits no edema.  Lymphadenopathy:    He has no cervical adenopathy.  Neurological: He is alert and oriented to person, place, and time.  Speech is clear and goal oriented Moves extremities without ataxia  Skin: Skin is warm and dry. No rash noted. He is not diaphoretic.  Psychiatric: He has a normal mood and affect.  Nursing note and vitals reviewed.   ED Course  Procedures (including critical care time) Labs Review Labs Reviewed  CBC WITH DIFFERENTIAL/PLATELET - Abnormal; Notable for the following:    Neutrophils Relative % 42 (*)    Monocytes Relative 14 (*)    All other components within normal limits  COMPREHENSIVE METABOLIC PANEL - Abnormal; Notable for the following:    Creatinine, Ser 1.67 (*)    Albumin 3.3 (*)    GFR calc non Af Amer 41 (*)    GFR calc Af  Amer 77 (*)    All other components within normal limits    Imaging Review Dg Chest 2 View  11/30/2014   CLINICAL DATA:  Cough and shortness of breath  EXAM: CHEST  2 VIEW  COMPARISON:  05/16/2006  FINDINGS: The heart size and mediastinal contours are within normal limits. Both lungs are clear. The visualized skeletal structures are unremarkable.  IMPRESSION: No active cardiopulmonary disease.   Electronically Signed   By: Conchita Paris M.D.   On: 11/30/2014 18:54     EKG Interpretation None      MDM   Final diagnoses:  Low grade fever  Cough    Dnaiel L Mohabir presents with congested cough, low grade fever and MSK chest pain with cough  only beginning last night.  No risk factors for PE. Patient without tachycardia here. Patient with low-grade fever initially; no hypoxia.    9:19 PM Pt CXR without evidence of PNA and no focal abnormal breath sounds. Pt requesting abx.  Will d/c home with Azithromycin and Hycodan.  PERC negative and no concern for PE at this time.    I have personally reviewed patient's vitals, nursing note and any pertinent labs or imaging.  I performed an undressed physical exam.    It has been determined that no acute conditions requiring further emergency intervention are present at this time. The patient/guardian have been advised of the diagnosis and plan. I reviewed all labs and imaging including any potential incidental findings. We have discussed signs and symptoms that warrant return to the ED and they are listed in the discharge instructions.    Vital signs are stable at discharge.   BP 144/79 mmHg  Pulse 64  Temp(Src) 98.4 F (36.9 C) (Oral)  Resp 16  SpO2 97%   The patient was discussed with and seen by Dr. Ralene Bathe who agrees with the treatment plan.       Jarrett Soho Raiden Haydu, PA-C 11/30/14 2126  Quintella Reichert, MD 11/30/14 2137

## 2014-11-30 NOTE — ED Notes (Signed)
Patient transported to X-ray 

## 2014-11-30 NOTE — ED Notes (Signed)
The pt has had a cough productive brown since yesterday.  He is not a smoker.  Low grade temp here.  He has pain throughout his chest when he coughs otherwise no pain.

## 2014-12-27 ENCOUNTER — Emergency Department (HOSPITAL_COMMUNITY)
Admission: EM | Admit: 2014-12-27 | Discharge: 2014-12-28 | Disposition: A | Discharge status: Home / Self Care | Payer: Medicare Other | Attending: Emergency Medicine | Admitting: Emergency Medicine

## 2014-12-27 ENCOUNTER — Encounter (HOSPITAL_COMMUNITY): Payer: Self-pay | Admitting: Emergency Medicine

## 2014-12-27 DIAGNOSIS — Z791 Long term (current) use of non-steroidal anti-inflammatories (NSAID): Secondary | ICD-10-CM | POA: Insufficient documentation

## 2014-12-27 DIAGNOSIS — I10 Essential (primary) hypertension: Secondary | ICD-10-CM | POA: Diagnosis not present

## 2014-12-27 DIAGNOSIS — Z792 Long term (current) use of antibiotics: Secondary | ICD-10-CM | POA: Insufficient documentation

## 2014-12-27 DIAGNOSIS — M546 Pain in thoracic spine: Secondary | ICD-10-CM

## 2014-12-27 DIAGNOSIS — R109 Unspecified abdominal pain: Secondary | ICD-10-CM | POA: Insufficient documentation

## 2014-12-27 DIAGNOSIS — Z7952 Long term (current) use of systemic steroids: Secondary | ICD-10-CM | POA: Insufficient documentation

## 2014-12-27 DIAGNOSIS — Z79899 Other long term (current) drug therapy: Secondary | ICD-10-CM | POA: Diagnosis not present

## 2014-12-27 NOTE — ED Notes (Signed)
Pt. reports mid / left  back pain onset yesterday worse when lying down , denies injury or fall , respirations unlabored .

## 2014-12-27 NOTE — ED Provider Notes (Signed)
CSN: [id number]     Arrival date & time 12/27/14  2227 History  This chart was scribed for Etta Quill, NP working with Linton Flemings, MD by Randa Evens, ED Scribe. This patient was seen in room TR06C/TR06C and the patient's care was started at 11:45 PM.    Chief Complaint  Patient presents with  . Back Pain   Patient is a 69 y.o. male presenting with back pain. The history is provided by the patient. No language interpreter was used.  Back Pain  HPI Comments: [name] [name] is a 69 y.o. male who presents to the Emergency Department complaining of left sided mid back pain onset 1 day ago. Pt states that the pain is non radiating. Pt states that the pain is worse when lying down. Pt denies injury or fall. Pt states that he has tried tylenol and a muscle relaxant that has not provided any relief. Pt states that he has tried cold compress and heat with no relief. Pt states that he had similar pain about 1 week ago that he saw his PCP and received an injection that provided relief.   Past Medical History  Diagnosis Date  . Hypertension    History reviewed. No pertinent past surgical history. No family history on file. History  Substance Use Topics  . Smoking status: Never Smoker   . Smokeless tobacco: Not on file  . Alcohol Use: No    Review of Systems  Musculoskeletal: Positive for back pain.  All other systems reviewed and are negative.    Allergies  Review of patient's allergies indicates no known allergies.  Home Medications   Prior to Admission medications   Medication Sig Start Date End Date Taking? Authorizing Provider  allopurinol (ZYLOPRIM) 300 MG tablet Take 300 mg by mouth daily.    Historical Provider, MD  amLODipine (NORVASC) 10 MG tablet Take 10 mg by mouth daily. 09/13/14   Historical Provider, MD  azithromycin (ZITHROMAX) 250 MG tablet Take 1 tablet (250 mg total) by mouth daily. Take first 2 tablets together, then 1 every day until finished. 11/30/14   Hannah  Muthersbaugh, PA-C  bisacodyl (DULCOLAX) 5 MG EC tablet Take 5 mg by mouth daily as needed for moderate constipation.    Historical Provider, MD  colchicine 0.6 MG tablet Take 0.6 mg by mouth daily as needed (for gout).     Historical Provider, MD  Colesevelam HCl (WELCHOL PO) Take 1 tablet by mouth daily as needed (high cholesterol).    Historical Provider, MD  diphenhydrAMINE (BENADRYL) 25 mg capsule Take 25 mg by mouth every 6 (six) hours as needed for itching.    Historical Provider, MD  dutasteride (AVODART) 0.5 MG capsule Take 0.5 mg by mouth at bedtime.  10/29/14   Historical Provider, MD  HYDROcodone-homatropine (HYCODAN) 5-1.5 MG/5ML syrup Take 5 mLs by mouth every 6 (six) hours as needed for cough. 11/30/14   Hannah Muthersbaugh, PA-C  irbesartan (AVAPRO) 300 MG tablet Take 300 mg by mouth daily. 09/13/14   Historical Provider, MD  methocarbamol (ROBAXIN) 500 MG tablet Take 1 tablet (500 mg total) by mouth 3 (three) times daily between meals as needed. Patient taking differently: Take 500 mg by mouth 3 (three) times daily between meals as needed for muscle spasms.  08/27/13   Tanna Furry, MD  naproxen (NAPROSYN) 500 MG tablet Take 1 tablet (500 mg total) by mouth 2 (two) times daily. Patient not taking: Reported on 11/30/2014 08/27/13   Tanna Furry, MD  pravastatin (  PRAVACHOL) 40 MG tablet Take 40 mg by mouth at bedtime.  09/13/14   Historical Provider, MD  PREDNISONE, PAK, PO Take 1 tablet by mouth daily. 5th day of dose pak    Historical Provider, MD  tamsulosin (FLOMAX) 0.4 MG CAPS capsule Take 0.4 mg by mouth daily.    Historical Provider, MD  triamcinolone (NASACORT) 55 MCG/ACT AERO nasal inhaler Place 2 sprays into the nose at bedtime as needed (congestion).    Historical Provider, MD   BP 178/83 mmHg  Pulse 87  Temp(Src) 98.5 F (36.9 C) (Oral)  Resp 14  SpO2 97%   Physical Exam  Constitutional: He is oriented to person, place, and time. He appears well-developed and well-nourished.  No distress.  HENT:  Head: Normocephalic and atraumatic.  Eyes: Conjunctivae and EOM are normal.  Neck: Neck supple. No tracheal deviation present.  Cardiovascular: Normal rate.   Pulmonary/Chest: Effort normal. No respiratory distress.  Musculoskeletal: Normal range of motion.  Left flank pain. No rash or bruising noted.   Neurological: He is alert and oriented to person, place, and time.  Skin: Skin is warm and dry. No rash noted.  Psychiatric: He has a normal mood and affect. His behavior is normal.  Nursing note and vitals reviewed.   ED Course  Procedures (including critical care time) DIAGNOSTIC STUDIES: Oxygen Saturation is 97% on RA, normal by my interpretation.    COORDINATION OF CARE: 11:51 PM-Discussed treatment plan with pt at bedside and pt agreed to plan.     Labs Review Labs Reviewed - No data to display  Imaging Review No results found.   EKG Interpretation None      MDM   Final diagnoses:  None  Patient with area of point tenderness in left flank area.  No indication of skin lesion or erythema.  UA obtained, no indication of blood or infection.  Left flank pain. Anti-inflammatory. Follow-up with PCP. Return precautions discussed.    I personally performed the services described in this documentation, which was scribed in my presence. The recorded information has been reviewed and is accurate.      Etta Quill, NP 12/28/14 2620  Linton Flemings, MD 12/28/14 [phone]

## 2014-12-28 LAB — URINALYSIS, ROUTINE W REFLEX MICROSCOPIC
BILIRUBIN URINE: NEGATIVE
GLUCOSE, UA: NEGATIVE mg/dL
HGB URINE DIPSTICK: NEGATIVE
Ketones, ur: NEGATIVE mg/dL
Leukocytes, UA: NEGATIVE
Nitrite: NEGATIVE
PH: 6 (ref 5.0–8.0)
Protein, ur: NEGATIVE mg/dL
SPECIFIC GRAVITY, URINE: 1.017 (ref 1.005–1.030)
Urobilinogen, UA: 0.2 mg/dL (ref 0.0–1.0)

## 2014-12-28 MED ORDER — NAPROXEN 250 MG PO TABS
500.0000 mg | ORAL_TABLET | Freq: Once | ORAL | Status: AC
  Administered 2014-12-28: 500 mg via ORAL
  Filled 2014-12-28: qty 2

## 2014-12-28 MED ORDER — NAPROXEN 500 MG PO TABS: 500.0000 mg | ORAL_TABLET | Freq: Two times a day (BID) | ORAL | Status: DC

## 2014-12-28 NOTE — ED Notes (Signed)
Pt A&OX4, ambulatory at d/c with steady gait, NAD 

## 2014-12-28 NOTE — Discharge Instructions (Signed)

## 2016-03-10 ENCOUNTER — Ambulatory Visit
Admission: RE | Admit: 2016-03-10 | Discharge: 2016-03-10 | Disposition: A | Discharge status: Home / Self Care | Payer: Commercial Managed Care - HMO | Source: Ambulatory Visit | Attending: Family Medicine | Admitting: Family Medicine

## 2016-03-10 ENCOUNTER — Other Ambulatory Visit: Payer: Self-pay | Admitting: Family Medicine

## 2016-03-10 DIAGNOSIS — M5442 Lumbago with sciatica, left side: Secondary | ICD-10-CM

## 2016-03-16 ENCOUNTER — Other Ambulatory Visit: Payer: Self-pay | Admitting: Family Medicine

## 2016-03-16 DIAGNOSIS — M545 Low back pain: Secondary | ICD-10-CM

## 2016-03-23 ENCOUNTER — Ambulatory Visit
Admission: RE | Admit: 2016-03-23 | Discharge: 2016-03-23 | Disposition: A | Discharge status: Home / Self Care | Payer: Commercial Managed Care - HMO | Source: Ambulatory Visit | Attending: Family Medicine | Admitting: Family Medicine

## 2016-03-23 DIAGNOSIS — M545 Low back pain: Secondary | ICD-10-CM

## 2016-04-22 ENCOUNTER — Ambulatory Visit (INDEPENDENT_AMBULATORY_CARE_PROVIDER_SITE_OTHER): Payer: Commercial Managed Care - HMO | Admitting: Family Medicine

## 2016-04-22 ENCOUNTER — Encounter: Payer: Self-pay | Admitting: Family Medicine

## 2016-04-22 VITALS — BP 140/76 | HR 74 | Temp 98.1°F | Resp 18 | Ht 69.0 in | Wt 237.0 lb

## 2016-04-22 DIAGNOSIS — M545 Low back pain: Secondary | ICD-10-CM | POA: Diagnosis not present

## 2016-04-22 DIAGNOSIS — R1032 Left lower quadrant pain: Secondary | ICD-10-CM

## 2016-04-22 DIAGNOSIS — M5442 Lumbago with sciatica, left side: Secondary | ICD-10-CM

## 2016-04-22 DIAGNOSIS — R350 Frequency of micturition: Secondary | ICD-10-CM | POA: Diagnosis not present

## 2016-04-22 LAB — POCT URINALYSIS DIP (MANUAL ENTRY)
BILIRUBIN UA: NEGATIVE
Glucose, UA: NEGATIVE
Ketones, POC UA: NEGATIVE
Leukocytes, UA: NEGATIVE
NITRITE UA: NEGATIVE
RBC UA: NEGATIVE
Spec Grav, UA: 1.02
Urobilinogen, UA: 0.2
pH, UA: 5.5

## 2016-04-22 LAB — COMPLETE METABOLIC PANEL WITH GFR
ALK PHOS: 67 U/L (ref 40–115)
ALT: 10 U/L (ref 9–46)
AST: 19 U/L (ref 10–35)
Albumin: 4.3 g/dL (ref 3.6–5.1)
BUN: 16 mg/dL (ref 7–25)
CO2: 27 mmol/L (ref 20–31)
Calcium: 9.6 mg/dL (ref 8.6–10.3)
Chloride: 100 mmol/L (ref 98–110)
Creat: 1.42 mg/dL — ABNORMAL HIGH (ref 0.70–1.25)
GFR, EST AFRICAN AMERICAN: 58 mL/min — AB (ref >=60)
GFR, EST NON AFRICAN AMERICAN: 50 mL/min — AB (ref >=60)
Glucose, Bld: 90 mg/dL (ref 65–99)
Potassium: 3.8 mmol/L (ref 3.5–5.3)
Sodium: 138 mmol/L (ref 135–146)
Total Bilirubin: 0.5 mg/dL (ref 0.2–1.2)
Total Protein: 7.6 g/dL (ref 6.1–8.1)

## 2016-04-22 LAB — POC MICROSCOPIC URINALYSIS (UMFC): MUCUS RE: ABSENT

## 2016-04-22 LAB — PSA: PSA: 1.3 ng/mL (ref <=4.0)

## 2016-04-22 MED ORDER — GABAPENTIN 300 MG PO CAPS: 300.0000 mg | ORAL_CAPSULE | Freq: Three times a day (TID) | ORAL | 0 refills | Status: DC

## 2016-04-22 MED ORDER — GABAPENTIN 300 MG PO CAPS: 300.0000 mg | ORAL_CAPSULE | Freq: Three times a day (TID) | ORAL | 0 refills | Status: DC | PRN

## 2016-04-22 NOTE — Progress Notes (Signed)
Patient ID: [name] [name], male    DOB: 1945/09/04, 70 y.o.   MRN: [id number]  PCP: Vena Austria, MD  Chief Complaint  Patient presents with  . Back Pain    Subjective:   HPI Presents for evaluation of low back pain times 6-8 weeks.  70 year old male presents with a complain of low back pain.  He denies any injury. He has seen his primary care provider and received oral prednisone and prednisone injection.  He reports improvement of pain while taking medication however  pain returned once medication was completed.  He reports his Tramadol helps mildly. He has better pain relief  with heat. Hard stools flat, 6-8 week. Not as frequent everyday. He reports he has also been seen by his primary care provider for increased frequency of urination. His primary care provider has placed him on a new medication,  Avodart, for urination frequency that is improving   . Social History   Social History  . Marital status: Single    Spouse name: N/A  . Number of children: N/A  . Years of education: N/A   Occupational History  . Not on file.   Social History Main Topics  . Smoking status: Never Smoker  . Smokeless tobacco: Never Used  . Alcohol use No  . Drug use: No  . Sexual activity: Not on file   Other Topics Concern  . Not on file   Social History Narrative  . No narrative on file   . Family History  Problem Relation Age of Onset  . Cancer Mother   . Hypertension Mother   . Heart disease Father   . Hypertension Father   . Hypertension Sister   . Hypertension Brother     Review of Systems  Respiratory: Negative.   Cardiovascular: Negative.   Musculoskeletal: Positive for back pain.    There are no active problems to display for this patient.    Prior to Admission medications   Medication Sig Start Date End Date Taking? Authorizing Provider  allopurinol (ZYLOPRIM) 300 MG tablet Take 300 mg by mouth daily.   Yes Historical Provider, MD  amLODipine  (NORVASC) 10 MG tablet Take 10 mg by mouth daily. 09/13/14  Yes Historical Provider, MD  azithromycin (ZITHROMAX) 250 MG tablet Take 1 tablet (250 mg total) by mouth daily. Take first 2 tablets together, then 1 every day until finished. 11/30/14  Yes Hannah Muthersbaugh, PA-C  bisacodyl (DULCOLAX) 5 MG EC tablet Take 5 mg by mouth daily as needed for moderate constipation.   Yes Historical Provider, MD  colchicine 0.6 MG tablet Take 0.6 mg by mouth daily as needed (for gout).    Yes Historical Provider, MD  Colesevelam HCl (WELCHOL PO) Take 1 tablet by mouth daily as needed (high cholesterol).   Yes Historical Provider, MD  diphenhydrAMINE (BENADRYL) 25 mg capsule Take 25 mg by mouth every 6 (six) hours as needed for itching.   Yes Historical Provider, MD  dutasteride (AVODART) 0.5 MG capsule Take 0.5 mg by mouth at bedtime.  10/29/14  Yes Historical Provider, MD  HYDROcodone-acetaminophen (NORCO/VICODIN) 5-325 MG tablet Take 1-2 tablets by mouth every 6 (six) hours as needed for moderate pain.   Yes Historical Provider, MD  HYDROcodone-homatropine (HYCODAN) 5-1.5 MG/5ML syrup Take 5 mLs by mouth every 6 (six) hours as needed for cough. 11/30/14  Yes Hannah Muthersbaugh, PA-C  irbesartan (AVAPRO) 300 MG tablet Take 300 mg by mouth daily. 09/13/14  Yes Historical Provider, MD  methocarbamol (  ROBAXIN) 500 MG tablet Take 1 tablet (500 mg total) by mouth 3 (three) times daily between meals as needed. Patient taking differently: Take 500 mg by mouth 3 (three) times daily between meals as needed for muscle spasms.  08/27/13  Yes Tanna Furry, MD  naproxen (NAPROSYN) 500 MG tablet Take 1 tablet (500 mg total) by mouth 2 (two) times daily. 12/28/14  Yes Etta Quill, NP  tamsulosin (FLOMAX) 0.4 MG CAPS capsule Take 0.4 mg by mouth daily.   Yes Historical Provider, MD  traMADol (ULTRAM) 50 MG tablet Take by mouth every 6 (six) hours as needed.   Yes Historical Provider, MD  triamcinolone (NASACORT) 55 MCG/ACT AERO nasal  inhaler Place 2 sprays into the nose at bedtime as needed (congestion).   Yes Historical Provider, MD  pravastatin (PRAVACHOL) 40 MG tablet Take 40 mg by mouth at bedtime.  09/13/14   Historical Provider, MD     No Known Allergies     Objective:  Physical Exam  Constitutional: He is oriented to person, place, and time. He appears well-developed and well-nourished.  HENT:  Head: Normocephalic and atraumatic.  Right Ear: External ear normal.  Left Ear: External ear normal.  Eyes: Conjunctivae are normal. Pupils are equal, round, and reactive to light.  Neck: Normal range of motion.  Cardiovascular: Normal rate, regular rhythm, normal heart sounds and intact distal pulses.   Pulmonary/Chest: Effort normal and breath sounds normal.  Abdominal: Soft. Bowel sounds are normal. He exhibits no distension and no mass. There is no tenderness. There is no rebound and no guarding.  Genitourinary: Rectum normal and prostate normal.  Musculoskeletal: Normal range of motion. He exhibits edema.  No back pain tenderness with palpation.   Neurological: He is alert and oriented to person, place, and time.  Skin: Skin is warm and dry.    Vitals:   04/22/16 1431  BP: 140/76  Pulse: 74  Resp: 18  Temp: 98.1 F (36.7 C)     Assessment & Plan:  1. Left-sided low back pain with left-sided sciatica - POCT Microscopic Urinalysis ([facility]) - POCT urinalysis dipstick -Start Gabapentin and continue Tramadol for back pain.  2. Groin pain, left - PSA - COMPLETE METABOLIC PANEL WITH GFR -consider referral to urology if elevated.  Patient uncertain of the last time PSA was checked.  3. Urinary frequency - PSA - COMPLETE METABOLIC PANEL WITH GFR -Currently taking, prior CMP showed a GFR 47 with an elevated creatinine 1.67 which was taken back in 2016.  If GFR remains <60 and creatinine is elevated, will consider a referral to nephrology.  Will follow up with labs!  Carroll Sage. Kenton Kingfisher, MSN, FNP-C [facility]

## 2016-04-22 NOTE — Patient Instructions (Addendum)
  I will follow-up with you regarding your labs.  Start taking Gabapentin 3 times daily as neededand your Ultram that is already prescribed for back pain.  Avoid taking any Ibuprofen or Naproxen these medications are toxic to your kidneys.  Please return for follow-up in 2 weeks.  If your back pain is still persistent, I will consider referring you to orthopedics.   Carroll Sage. Kenton Kingfisher, MSN, FNP-C [facility] [place]     IF you received an x-ray today, you will receive an invoice from [facility]. Please contact [facility] at [phone] with questions or concerns regarding your invoice.   IF you received labwork today, you will receive an invoice from [employer]. Please contact [facility] at [phone] with questions or concerns regarding your invoice.   Our billing staff will not be able to assist you with questions regarding bills from these companies.  You will be contacted with the lab results as soon as they are available. The fastest way to get your results is to activate your My Chart account. Instructions are located on the last page of this paperwork. If you have not heard from [place] regarding the results in 2 weeks, please contact this office.

## 2016-04-23 ENCOUNTER — Telehealth: Payer: Self-pay

## 2016-04-23 DIAGNOSIS — M5442 Lumbago with sciatica, left side: Principal | ICD-10-CM

## 2016-04-23 DIAGNOSIS — M5441 Lumbago with sciatica, right side: Secondary | ICD-10-CM

## 2016-04-23 NOTE — Telephone Encounter (Signed)
Please contact patient and advised that his lab results were as expected.  I am putting in a referral for him to follow-up with orthopedics regarding his back pain. Continue gabapentin and ultram for pain.  Carroll Sage. Kenton Kingfisher, MSN, FNP-C [facility]

## 2016-04-23 NOTE — Telephone Encounter (Signed)
PATIENT STATES HE SAW KIMBERLY HARRIS Thursday FOR PROTEIN IN HIS URINE AND BACK PAIN. SHE DID SOME LAB WORK ON HIM. HE WOULD LIKE TO GET THE RESULTS OF WHAT SHE HAS FOUND OUT. BEST PHONE [phone] (CELL)  PHARMACY CHOICE IS [facility] ON [address].  [place]

## 2016-04-23 NOTE — Telephone Encounter (Signed)
Please review

## 2016-04-26 ENCOUNTER — Encounter: Payer: Self-pay | Admitting: Family Medicine

## 2016-04-29 ENCOUNTER — Emergency Department (HOSPITAL_COMMUNITY)
Admission: EM | Admit: 2016-04-29 | Discharge: 2016-04-29 | Disposition: A | Discharge status: Home / Self Care | Payer: Commercial Managed Care - HMO | Attending: Emergency Medicine | Admitting: Emergency Medicine

## 2016-04-29 ENCOUNTER — Encounter (HOSPITAL_COMMUNITY): Payer: Self-pay | Admitting: Emergency Medicine

## 2016-04-29 DIAGNOSIS — Z79899 Other long term (current) drug therapy: Secondary | ICD-10-CM | POA: Insufficient documentation

## 2016-04-29 DIAGNOSIS — M545 Low back pain, unspecified: Secondary | ICD-10-CM

## 2016-04-29 DIAGNOSIS — R109 Unspecified abdominal pain: Secondary | ICD-10-CM | POA: Diagnosis not present

## 2016-04-29 DIAGNOSIS — K5901 Slow transit constipation: Secondary | ICD-10-CM | POA: Diagnosis not present

## 2016-04-29 DIAGNOSIS — I10 Essential (primary) hypertension: Secondary | ICD-10-CM | POA: Diagnosis not present

## 2016-04-29 HISTORY — DX: Gout, unspecified: M10.9

## 2016-04-29 LAB — URINALYSIS, ROUTINE W REFLEX MICROSCOPIC
BILIRUBIN URINE: NEGATIVE
Glucose, UA: NEGATIVE mg/dL
Hgb urine dipstick: NEGATIVE
Ketones, ur: NEGATIVE mg/dL
Leukocytes, UA: NEGATIVE
Nitrite: NEGATIVE
PH: 6.5 (ref 5.0–8.0)
Protein, ur: 30 mg/dL — AB
SPECIFIC GRAVITY, URINE: 1.02 (ref 1.005–1.030)

## 2016-04-29 LAB — COMPREHENSIVE METABOLIC PANEL
ALBUMIN: 3.9 g/dL (ref 3.5–5.0)
ALT: 12 U/L — ABNORMAL LOW (ref 17–63)
ANION GAP: 7 (ref 5–15)
AST: 21 U/L (ref 15–41)
Alkaline Phosphatase: 60 U/L (ref 38–126)
BUN: 16 mg/dL (ref 6–20)
CHLORIDE: 105 mmol/L (ref 101–111)
CO2: 27 mmol/L (ref 22–32)
Calcium: 9.3 mg/dL (ref 8.9–10.3)
Creatinine, Ser: 1.45 mg/dL — ABNORMAL HIGH (ref 0.61–1.24)
GFR calc Af Amer: 56 mL/min — ABNORMAL LOW (ref >60)
GFR calc non Af Amer: 48 mL/min — ABNORMAL LOW (ref >60)
GLUCOSE: 103 mg/dL — AB (ref 65–99)
Potassium: 4 mmol/L (ref 3.5–5.1)
SODIUM: 139 mmol/L (ref 135–145)
Total Bilirubin: 0.6 mg/dL (ref 0.3–1.2)
Total Protein: 6.9 g/dL (ref 6.5–8.1)

## 2016-04-29 LAB — URINE MICROSCOPIC-ADD ON
BACTERIA UA: NONE SEEN
Squamous Epithelial / LPF: NONE SEEN
WBC UA: NONE SEEN WBC/hpf (ref 0–5)

## 2016-04-29 LAB — CBC
HCT: 43.2 % (ref 39.0–52.0)
HEMOGLOBIN: 14.2 g/dL (ref 13.0–17.0)
MCH: 29.9 pg (ref 26.0–34.0)
MCHC: 32.9 g/dL (ref 30.0–36.0)
MCV: 90.9 fL (ref 78.0–100.0)
Platelets: 223 10*3/uL (ref 150–400)
RBC: 4.75 MIL/uL (ref 4.22–5.81)
RDW: 14.2 % (ref 11.5–15.5)
WBC: 6.5 10*3/uL (ref 4.0–10.5)

## 2016-04-29 LAB — LIPASE, BLOOD: LIPASE: 32 U/L (ref 11–51)

## 2016-04-29 MED ORDER — DOCUSATE SODIUM 100 MG PO CAPS: 100.0000 mg | ORAL_CAPSULE | Freq: Two times a day (BID) | ORAL | 0 refills | Status: DC

## 2016-04-29 MED ORDER — BUPIVACAINE HCL (PF) 0.5 % IJ SOLN
10.0000 mL | Freq: Once | INTRAMUSCULAR | Status: AC
  Administered 2016-04-29: 10 mL
  Filled 2016-04-29: qty 10

## 2016-04-29 MED ORDER — POLYETHYLENE GLYCOL 3350 17 GM/SCOOP PO POWD: ORAL | 0 refills | Status: DC

## 2016-04-29 NOTE — ED Provider Notes (Signed)
[facility] DEPT Provider Note   CSN: [id number] Arrival date & time: 04/29/16  1805     History   Chief Complaint Chief Complaint  Patient presents with  . Abdominal Pain    HPI [name] [name] is a 70 y.o. male.  The history is provided by the patient.  Abdominal Pain   This is a recurrent problem. Episode onset: 1 month ago. The problem occurs constantly. The problem has not changed since onset.The pain is associated with an unknown factor. The pain is located in the LLQ and LUQ (left flank and groin with low back pain). The quality of the pain is aching. The pain is mild. Associated symptoms include constipation. Pertinent negatives include anorexia, fever, diarrhea, hematochezia, melena, nausea and vomiting. The symptoms are aggravated by certain positions. Nothing relieves the symptoms.    Past Medical History:  Diagnosis Date  . Arthritis   . Gout   . Heart murmur   . Hypertension     There are no active problems to display for this patient.   Past Surgical History:  Procedure Laterality Date  . HERNIA REPAIR         Home Medications    Prior to Admission medications   Medication Sig Start Date End Date Taking? Authorizing Provider  allopurinol (ZYLOPRIM) 300 MG tablet Take 300 mg by mouth daily as needed (gout).    Yes Historical Provider, MD  amLODipine (NORVASC) 10 MG tablet Take 10 mg by mouth daily. 09/13/14  Yes Historical Provider, MD  bisacodyl (DULCOLAX) 5 MG EC tablet Take 5 mg by mouth daily as needed for moderate constipation.   Yes Historical Provider, MD  colchicine 0.6 MG tablet Take 0.6 mg by mouth daily as needed (for gout).    Yes Historical Provider, MD  Colesevelam HCl (WELCHOL PO) Take 1 tablet by mouth daily as needed (high cholesterol).   Yes Historical Provider, MD  diphenhydrAMINE (BENADRYL) 25 mg capsule Take 25 mg by mouth every 6 (six) hours as needed for itching.   Yes Historical Provider, MD  dutasteride (AVODART) 0.5 MG  capsule Take 0.5 mg by mouth at bedtime.  10/29/14  Yes Historical Provider, MD  gabapentin (NEURONTIN) 300 MG capsule Take 1 capsule (300 mg total) by mouth 3 (three) times daily as needed. Patient taking differently: Take 300-900 mg by mouth 2 (two) times daily. Take 300mg  in the morning and 900mg  in the evening. 04/22/16  Yes Sedalia Muta, FNP  HYDROcodone-acetaminophen (NORCO/VICODIN) 5-325 MG tablet Take 1-2 tablets by mouth every 6 (six) hours as needed for moderate pain.   Yes Historical Provider, MD  irbesartan (AVAPRO) 300 MG tablet Take 300 mg by mouth daily. 09/13/14  Yes Historical Provider, MD  methocarbamol (ROBAXIN) 500 MG tablet Take 1 tablet (500 mg total) by mouth 3 (three) times daily between meals as needed. Patient taking differently: Take 500 mg by mouth 3 (three) times daily between meals as needed for muscle spasms.  08/27/13  Yes Tanna Furry, MD  Multiple Vitamin (MULTIVITAMIN WITH MINERALS) TABS tablet Take 1 tablet by mouth daily.   Yes Historical Provider, MD  tamsulosin (FLOMAX) 0.4 MG CAPS capsule Take 0.4 mg by mouth at bedtime.    Yes Historical Provider, MD  traMADol (ULTRAM) 50 MG tablet Take 50-100 mg by mouth every 6 (six) hours as needed for moderate pain.    Yes Historical Provider, MD  triamcinolone (NASACORT) 55 MCG/ACT AERO nasal inhaler Place 2 sprays into the nose at bedtime as  needed (congestion).   Yes Historical Provider, MD    Family History Family History  Problem Relation Age of Onset  . Cancer Mother   . Hypertension Mother   . Heart disease Father   . Hypertension Father   . Hypertension Sister   . Hypertension Brother     Social History Social History  Substance Use Topics  . Smoking status: Never Smoker  . Smokeless tobacco: Never Used  . Alcohol use No     Allergies   Review of patient's allergies indicates no known allergies.   Review of Systems Review of Systems  Constitutional: Negative for fever.    Gastrointestinal: Positive for abdominal pain and constipation. Negative for anorexia, diarrhea, hematochezia, melena, nausea and vomiting.  All other systems reviewed and are negative.    Physical Exam Updated Vital Signs BP 172/87 (BP Location: Left Arm)   Pulse 68   Temp 97.9 F (36.6 C) (Oral)   Resp 16   SpO2 99%   Physical Exam  Constitutional: He is oriented to person, place, and time. He appears well-developed and well-nourished. No distress.  HENT:  Head: Normocephalic and atraumatic.  Eyes: Conjunctivae are normal.  Neck: Neck supple. No tracheal deviation present.  Cardiovascular: Normal rate, regular rhythm and normal heart sounds.   Pulmonary/Chest: Effort normal and breath sounds normal. No respiratory distress. He exhibits tenderness.    Abdominal: Soft. He exhibits no distension. There is no tenderness. There is no rigidity, no rebound and no guarding. Hernia confirmed negative in the right inguinal area and confirmed negative in the left inguinal area.  Genitourinary: Testes normal. Right testis shows no mass and no tenderness. Left testis shows no mass and no tenderness.  Musculoskeletal:       Lumbar back: He exhibits tenderness.       Back:  Neurological: He is alert and oriented to person, place, and time.  Skin: Skin is warm and dry.  Psychiatric: He has a normal mood and affect.  Vitals reviewed.    ED Treatments / Results  Labs (all labs ordered are listed, but only abnormal results are displayed) Labs Reviewed  COMPREHENSIVE METABOLIC PANEL - Abnormal; Notable for the following:       Result Value   Glucose, Bld 103 (*)    Creatinine, Ser 1.45 (*)    ALT 12 (*)    GFR calc non Af Amer 48 (*)    GFR calc Af Amer 56 (*)    All other components within normal limits  URINALYSIS, ROUTINE W REFLEX MICROSCOPIC (NOT AT [facility]) - Abnormal; Notable for the following:    Protein, ur 30 (*)    All other components within normal limits  URINE  MICROSCOPIC-ADD ON - Abnormal; Notable for the following:    Casts GRANULAR CAST (*)    All other components within normal limits  LIPASE, BLOOD  CBC    EKG  EKG Interpretation None       Radiology No results found.  Procedures Procedures (including critical care time)  Procedure Note: Trigger Point Injection for Myofascial pain  Performed by Dr. Laneta Simmers Indication: muscle/myofascial pain Muscle body and tendon sheath of the left lumbar paraspinal muscle(s) were injected with 0.5% bupivacaine under sterile technique for release of muscle spasm/pain. Patient tolerated well with immediate improvement of symptoms and no immediate complications following procedure.  CPT Code:   1 or 2 muscle bodies: 20552    Medications Ordered in ED Medications  bupivacaine (MARCAINE) 0.5 % injection 10  mL (10 mLs Infiltration Given 04/29/16 2213)     Initial Impression / Assessment and Plan / ED Course  I have reviewed the triage vital signs and the nursing notes.  Pertinent labs & imaging results that were available during my care of the patient were reviewed by me and considered in my medical decision making (see chart for details).  Clinical Course    70 y.o. male presents with left sided abdominal pain that has been present for roughly a month. He is on chronic hydrocodone. Bowel movements have been firm and hard to pass. Abdominal exam reassuring with no evidence of diverticulitis, colitis, nephrolithiasis, hernia, testicular torsion or other emergent etiology for pain.  Discussed miralax therapy with stool softeners to help with constipation issues. Secondary complaint of ongoing left low back pain to which trigger point was applied to a sore muscle with good relief. Plan to follow up with PCP as needed and return precautions discussed for worsening or new concerning symptoms.   Final Clinical Impressions(s) / ED Diagnoses   Final diagnoses:  Left-sided low back pain without  sciatica  Left flank pain  Slow transit constipation    New Prescriptions Discharge Medication List as of 04/29/2016 10:27 PM    START taking these medications   Details  docusate sodium (COLACE) 100 MG capsule Take 1 capsule (100 mg total) by mouth every 12 (twelve) hours., Starting Thu 04/29/2016, Print    polyethylene glycol powder (MIRALAX) powder TAKE 6 CAPFULS OF MIRALAX IN A 32 OUNCE GATORADE AND DRINK THE WHOLE BEVERAGE FOLLOWED BY 3 CAPFULS TWICE A DAY FOR THE NEXT WEEK AND FOLLOW UP WITH YOUR PRIMARY CARE PHYSICIAN., Print         Leo Grosser, MD 04/30/16 [phone]

## 2016-04-29 NOTE — ED Notes (Signed)
Dr. Ivar Bury explained plan of care to pt. Marland Kitchen

## 2016-04-29 NOTE — ED Triage Notes (Signed)
Pt states "ive had stomach stomach pain on the left side for about a month and i've been seen for it before and we cant figure it out. Pain shoots down to my leg and left side of my groin". Pt ambulatory in NAD.

## 2016-05-06 ENCOUNTER — Other Ambulatory Visit: Payer: Self-pay | Admitting: Family Medicine

## 2016-05-06 DIAGNOSIS — R1012 Left upper quadrant pain: Secondary | ICD-10-CM

## 2016-05-07 ENCOUNTER — Ambulatory Visit
Admission: RE | Admit: 2016-05-07 | Discharge: 2016-05-07 | Disposition: A | Discharge status: Home / Self Care | Payer: Commercial Managed Care - HMO | Source: Ambulatory Visit | Attending: Family Medicine | Admitting: Family Medicine

## 2016-05-07 DIAGNOSIS — R1012 Left upper quadrant pain: Secondary | ICD-10-CM

## 2016-05-07 MED ORDER — IOPAMIDOL (ISOVUE-300) INJECTION 61%
125.0000 mL | Freq: Once | INTRAVENOUS | Status: AC | PRN
  Administered 2016-05-07: 125 mL via INTRAVENOUS

## 2016-08-30 DIAGNOSIS — G4730 Sleep apnea, unspecified: Secondary | ICD-10-CM

## 2016-08-30 HISTORY — DX: Sleep apnea, unspecified: G47.30

## 2016-10-12 DIAGNOSIS — M7552 Bursitis of left shoulder: Secondary | ICD-10-CM | POA: Diagnosis not present

## 2016-10-12 DIAGNOSIS — M5137 Other intervertebral disc degeneration, lumbosacral region: Secondary | ICD-10-CM | POA: Diagnosis not present

## 2016-11-04 DIAGNOSIS — E7800 Pure hypercholesterolemia, unspecified: Secondary | ICD-10-CM | POA: Diagnosis not present

## 2016-11-10 DIAGNOSIS — N4830 Priapism, unspecified: Secondary | ICD-10-CM | POA: Diagnosis not present

## 2016-11-17 DIAGNOSIS — N401 Enlarged prostate with lower urinary tract symptoms: Secondary | ICD-10-CM | POA: Diagnosis not present

## 2016-11-17 DIAGNOSIS — R102 Pelvic and perineal pain: Secondary | ICD-10-CM | POA: Diagnosis not present

## 2016-11-17 DIAGNOSIS — N5201 Erectile dysfunction due to arterial insufficiency: Secondary | ICD-10-CM | POA: Diagnosis not present

## 2016-11-17 DIAGNOSIS — R3915 Urgency of urination: Secondary | ICD-10-CM | POA: Diagnosis not present

## 2017-02-01 DIAGNOSIS — R69 Illness, unspecified: Secondary | ICD-10-CM | POA: Diagnosis not present

## 2017-02-09 DIAGNOSIS — E7800 Pure hypercholesterolemia, unspecified: Secondary | ICD-10-CM | POA: Diagnosis not present

## 2017-04-09 DIAGNOSIS — M25562 Pain in left knee: Secondary | ICD-10-CM | POA: Diagnosis not present

## 2017-05-30 DIAGNOSIS — Z6835 Body mass index (BMI) 35.0-35.9, adult: Secondary | ICD-10-CM | POA: Diagnosis not present

## 2017-05-30 DIAGNOSIS — K5900 Constipation, unspecified: Secondary | ICD-10-CM | POA: Diagnosis not present

## 2017-05-30 DIAGNOSIS — N183 Chronic kidney disease, stage 3 (moderate): Secondary | ICD-10-CM | POA: Diagnosis not present

## 2017-05-30 DIAGNOSIS — N400 Enlarged prostate without lower urinary tract symptoms: Secondary | ICD-10-CM | POA: Diagnosis not present

## 2017-05-30 DIAGNOSIS — Z0000 Encounter for general adult medical examination without abnormal findings: Secondary | ICD-10-CM | POA: Diagnosis not present

## 2017-05-30 DIAGNOSIS — N529 Male erectile dysfunction, unspecified: Secondary | ICD-10-CM | POA: Diagnosis not present

## 2017-05-30 DIAGNOSIS — B0229 Other postherpetic nervous system involvement: Secondary | ICD-10-CM | POA: Diagnosis not present

## 2017-05-30 DIAGNOSIS — M109 Gout, unspecified: Secondary | ICD-10-CM | POA: Diagnosis not present

## 2017-05-30 DIAGNOSIS — I129 Hypertensive chronic kidney disease with stage 1 through stage 4 chronic kidney disease, or unspecified chronic kidney disease: Secondary | ICD-10-CM | POA: Diagnosis not present

## 2017-05-30 DIAGNOSIS — E7800 Pure hypercholesterolemia, unspecified: Secondary | ICD-10-CM | POA: Diagnosis not present

## 2017-06-01 ENCOUNTER — Other Ambulatory Visit: Payer: Self-pay | Admitting: Family Medicine

## 2017-06-01 DIAGNOSIS — Z136 Encounter for screening for cardiovascular disorders: Secondary | ICD-10-CM

## 2017-06-24 ENCOUNTER — Ambulatory Visit
Admission: RE | Admit: 2017-06-24 | Discharge: 2017-06-24 | Disposition: A | Discharge status: Home / Self Care | Payer: Medicare HMO | Source: Ambulatory Visit | Attending: Family Medicine | Admitting: Family Medicine

## 2017-06-24 DIAGNOSIS — Z87891 Personal history of nicotine dependence: Secondary | ICD-10-CM | POA: Diagnosis not present

## 2017-06-24 DIAGNOSIS — Z136 Encounter for screening for cardiovascular disorders: Secondary | ICD-10-CM

## 2017-07-12 DIAGNOSIS — R3912 Poor urinary stream: Secondary | ICD-10-CM | POA: Diagnosis not present

## 2017-07-12 DIAGNOSIS — N401 Enlarged prostate with lower urinary tract symptoms: Secondary | ICD-10-CM | POA: Diagnosis not present

## 2017-07-12 DIAGNOSIS — N5201 Erectile dysfunction due to arterial insufficiency: Secondary | ICD-10-CM | POA: Diagnosis not present

## 2017-10-03 DIAGNOSIS — R69 Illness, unspecified: Secondary | ICD-10-CM | POA: Diagnosis not present

## 2017-12-11 ENCOUNTER — Emergency Department (HOSPITAL_COMMUNITY): Payer: Medicare HMO

## 2017-12-11 ENCOUNTER — Encounter (HOSPITAL_COMMUNITY): Payer: Self-pay | Admitting: Emergency Medicine

## 2017-12-11 ENCOUNTER — Other Ambulatory Visit: Payer: Self-pay

## 2017-12-11 ENCOUNTER — Emergency Department (HOSPITAL_COMMUNITY)
Admission: EM | Admit: 2017-12-11 | Discharge: 2017-12-11 | Disposition: A | Discharge status: Home / Self Care | Payer: Medicare HMO | Attending: Emergency Medicine | Admitting: Emergency Medicine

## 2017-12-11 DIAGNOSIS — Z79899 Other long term (current) drug therapy: Secondary | ICD-10-CM | POA: Diagnosis not present

## 2017-12-11 DIAGNOSIS — R05 Cough: Secondary | ICD-10-CM | POA: Diagnosis not present

## 2017-12-11 DIAGNOSIS — R918 Other nonspecific abnormal finding of lung field: Secondary | ICD-10-CM | POA: Diagnosis not present

## 2017-12-11 DIAGNOSIS — N489 Disorder of penis, unspecified: Secondary | ICD-10-CM | POA: Insufficient documentation

## 2017-12-11 DIAGNOSIS — I10 Essential (primary) hypertension: Secondary | ICD-10-CM | POA: Insufficient documentation

## 2017-12-11 DIAGNOSIS — R0602 Shortness of breath: Secondary | ICD-10-CM

## 2017-12-11 LAB — BASIC METABOLIC PANEL
ANION GAP: 10 (ref 5–15)
BUN: 14 mg/dL (ref 6–20)
CHLORIDE: 104 mmol/L (ref 101–111)
CO2: 24 mmol/L (ref 22–32)
CREATININE: 1.55 mg/dL — AB (ref 0.61–1.24)
Calcium: 9.1 mg/dL (ref 8.9–10.3)
GFR calc non Af Amer: 44 mL/min — ABNORMAL LOW (ref >60)
GFR, EST AFRICAN AMERICAN: 51 mL/min — AB (ref >60)
Glucose, Bld: 96 mg/dL (ref 65–99)
Potassium: 3.8 mmol/L (ref 3.5–5.1)
SODIUM: 138 mmol/L (ref 135–145)

## 2017-12-11 LAB — CBC
HCT: 43.6 % (ref 39.0–52.0)
Hemoglobin: 14.7 g/dL (ref 13.0–17.0)
MCH: 30.5 pg (ref 26.0–34.0)
MCHC: 33.7 g/dL (ref 30.0–36.0)
MCV: 90.5 fL (ref 78.0–100.0)
PLATELETS: 236 10*3/uL (ref 150–400)
RBC: 4.82 MIL/uL (ref 4.22–5.81)
RDW: 14.1 % (ref 11.5–15.5)
WBC: 7 10*3/uL (ref 4.0–10.5)

## 2017-12-11 LAB — I-STAT TROPONIN, ED: Troponin i, poc: 0.05 ng/mL (ref 0.00–0.08)

## 2017-12-11 MED ORDER — AMOXICILLIN-POT CLAVULANATE 875-125 MG PO TABS: 1.0000 | ORAL_TABLET | Freq: Two times a day (BID) | ORAL | 0 refills | Status: DC

## 2017-12-11 MED ORDER — IOPAMIDOL (ISOVUE-300) INJECTION 61%
INTRAVENOUS | Status: AC
  Administered 2017-12-11: 75 mL
  Filled 2017-12-11: qty 100

## 2017-12-11 NOTE — ED Provider Notes (Addendum)
[facility] EMERGENCY DEPARTMENT Provider Note   CSN: [id number] Arrival date & time: 12/11/17  1033     History   Chief Complaint Chief Complaint  Patient presents with  . Shortness of Breath  . Cough    HPI [name] [name] is a 72 y.o. male.  He presents for evaluation of multiple problems including shortness of breath worse with movements, walking, and causing him to do less work than usual.  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  The breathing problem has been present for at least 2 weeks.  He also complains of pain in the penis with erections that is been present for 1 year and has already been evaluated by urology.  The patient is frustrated because he did not get an answer for his difficulty.  He noticed the shortness of breath after mowing his grass, about a week ago.  When he feels that has been present previous to that.  He denies headache, neck pain, back pain, chest pain, abdominal pain, nausea, vomiting, diarrhea, dysuria, urinary frequency or hematuria.  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  Patient is concerned about swelling of his breast which has been present for at least a year.  There are no other known modifying factors.  HPI  Past Medical History:  Diagnosis Date  . Arthritis   . Gout   . Heart murmur   . Hypertension     There are no active problems to display for this patient.   Past Surgical History:  Procedure Laterality Date  . HERNIA REPAIR          Home Medications    Prior to Admission medications   Medication Sig Start Date End Date Taking? Authorizing Provider  allopurinol (ZYLOPRIM) 300 MG tablet Take 300 mg by mouth daily as needed (gout).    Yes [provider]  amLODipine (NORVASC) 10 MG tablet Take 10 mg by mouth daily. 09/13/14  Yes [provider]  colchicine 0.6 MG tablet Take  0.6 mg by mouth daily as needed (for gout).    Yes [provider]  Colesevelam HCl (WELCHOL PO) Take 1 tablet by mouth daily as needed (high cholesterol).   Yes [provider]  diphenhydrAMINE (BENADRYL) 25 mg capsule Take 25 mg by mouth every 6 (six) hours as needed for itching.   Yes [provider]  docusate sodium (COLACE) 100 MG capsule Take 1 capsule (100 mg total) by mouth every 12 (twelve) hours. Patient taking differently: Take 100 mg by mouth 2 (two) times daily as needed for moderate constipation.  04/29/16  Yes Leo Grosser, MD  dutasteride (AVODART) 0.5 MG capsule Take 0.5 mg by mouth at bedtime.  10/29/14  Yes [provider]  gabapentin (NEURONTIN) 300 MG capsule Take 1 capsule (300 mg total) by mouth 3 (three) times daily as needed. Patient taking differently: Take 300 mg by mouth 3 (three) times daily.  04/22/16  Yes Scot Jun, FNP  HYDROcodone-acetaminophen (NORCO/VICODIN) 5-325 MG tablet Take 1-2 tablets by mouth every 6 (six) hours as needed for moderate pain.   Yes [provider]  methocarbamol (ROBAXIN) 500 MG tablet Take 1 tablet (500 mg total) by mouth 3 (three) times daily between meals as needed. Patient taking differently: Take 500 mg by mouth 3 (three) times daily between meals as needed for muscle spasms.  08/27/13  Yes Tanna Furry, MD  metoprolol succinate (TOPROL-XL) 100 MG 24 hr tablet Take 100 mg by mouth daily. 09/23/17  Yes [provider]  Multiple Vitamin (MULTIVITAMIN WITH MINERALS) TABS tablet Take 1 tablet by mouth daily.   Yes [provider]  polyethylene glycol powder (MIRALAX) powder TAKE 6 CAPFULS OF MIRALAX IN A 32 OUNCE GATORADE AND DRINK THE WHOLE BEVERAGE FOLLOWED BY 3 CAPFULS TWICE A DAY FOR THE NEXT WEEK AND FOLLOW UP WITH YOUR PRIMARY CARE PHYSICIAN. Patient taking differently: Take 17 g by mouth daily as needed for moderate constipation.  04/29/16  Yes Leo Grosser, MD    telmisartan (MICARDIS) 80 MG tablet Take 80 mg by mouth daily. 10/24/17  Yes [provider]  traMADol (ULTRAM) 50 MG tablet Take 50-100 mg by mouth every 6 (six) hours as needed for moderate pain.    Yes [provider]  triamcinolone (NASACORT) 55 MCG/ACT AERO nasal inhaler Place 2 sprays into the nose at bedtime as needed (congestion).   Yes [provider]    Family History Family History  Problem Relation Age of Onset  . Cancer Mother   . Hypertension Mother   . Heart disease Father   . Hypertension Father   . Hypertension Sister   . Hypertension Brother     Social History Social History   Tobacco Use  . Smoking status: Never Smoker  . Smokeless tobacco: Never Used  Substance Use Topics  . Alcohol use: No  . Drug use: No     Allergies   Patient has no known allergies.   Review of Systems Review of Systems  All other systems reviewed and are negative.    Physical Exam Updated Vital Signs BP (!) 183/101   Pulse (!) 57   Temp 98.6 F (37 C) (Oral)   Resp 17   Ht 5\' 9"  (1.753 m)   Wt 102.1 kg (225 lb)   SpO2 98%   BMI 33.23 kg/m   Physical Exam  Constitutional: He is oriented to person, place, and time. He appears well-developed. He does not appear ill.  Elderly, overweight  HENT:  Head: Normocephalic and atraumatic.  Right Ear: External ear normal.  Left Ear: External ear normal.  Eyes: Pupils are equal, round, and reactive to light. Conjunctivae and EOM are normal.  Neck: Normal range of motion and phonation normal. Neck supple.  Cardiovascular: Normal rate, regular rhythm and normal heart sounds.  Pulmonary/Chest: Effort normal and breath sounds normal. No stridor. No respiratory distress. He has no wheezes. He has no rales. He exhibits no bony tenderness.  Abdominal: Soft. There is no tenderness.  Genitourinary: Penis normal.  Genitourinary Comments: No urethral discharge.  Normal scrotum and scrotal contents.  No groin  hernia or mass.  Musculoskeletal: Normal range of motion.  Neurological: He is alert and oriented to person, place, and time. No cranial nerve deficit or sensory deficit. He exhibits normal muscle tone. Coordination normal.  Skin: Skin is warm, dry and intact.  Psychiatric: He has a normal mood and affect. His behavior is normal. Judgment and thought content normal.  Nursing note and vitals reviewed.    ED Treatments / Results  Labs (all labs ordered are listed, but only abnormal results are displayed) Labs Reviewed  BASIC METABOLIC PANEL - Abnormal; Notable for the following components:      Result Value   Creatinine, Ser 1.55 (*)    GFR calc non Af Amer 44 (*)    GFR calc Af  Amer 51 (*)    All other components within normal limits  CBC  I-STAT TROPONIN, ED    EKG EKG Interpretation  Date/Time:  Sunday December 11 2017 10:37:07 EDT Ventricular Rate:  71 PR Interval:  166 QRS Duration: 148 QT Interval:  444 QTC Calculation: 482 R Axis:   -134 Text Interpretation:  Normal sinus rhythm Right superior axis deviation Non-specific intra-ventricular conduction block Abnormal ECG Since last tracing new intra-ventricular conduction delay Confirmed by Daleen Bo [phone]) on 12/11/2017 12:06:07 PM Also confirmed by Daleen Bo [phone]), editor Laurena Spies [phone])  on 12/11/2017 12:32:12 PM   Radiology Dg Chest 2 View  Result Date: 12/11/2017 CLINICAL DATA:  Shortness of breath last night and today, orthopnea, history heart murmur, hypertension EXAM: CHEST - 2 VIEW COMPARISON:  11/30/2014 FINDINGS: Enlargement of cardiac silhouette. Mediastinal contours and pulmonary vascularity normal. Slight chronic accentuation of RIGHT basilar markings, stable. Lungs otherwise clear. No acute infiltrate, pleural effusion or pneumothorax. Question new 13 mm diameter nodule adjacent to inferior RIGHT hilum. Scattered endplate spur formation thoracic spine. IMPRESSION: Enlargement of cardiac  silhouette with minimal chronic accentuation of RIGHT basilar markings. Question new 13 mm diameter RIGHT lung nodule; CT chest recommended to exclude developing pulmonary nodule. An Electronically Signed   By: Lavonia Dana M.D.   On: 12/11/2017 11:28    Procedures Procedures (including critical care time)  Medications Ordered in ED Medications  iopamidol (ISOVUE-300) 61 % injection (75 mLs  Contrast Given 12/11/17 1435)     Initial Impression / Assessment and Plan / ED Course  I have reviewed the triage vital signs and the nursing notes.  Pertinent labs & imaging results that were available during my care of the patient were reviewed by me and considered in my medical decision making (see chart for details).  Clinical Course as of Dec 12 1611  Sun Dec 11, 2017  1343 Normal  I-stat troponin, ED [EW]  1343 CBC [EW]  1343 Normal   [EW]  1344 Normal except creatinine elevated 0.86  Basic metabolic panel(!) [EW]  7619 Creatinine unchanged from prior 3, with mild chronic renal insufficiency   [EW]  1344 Possible right lung nodule 13 mm, CT recommended for further evaluation  DG Chest 2 View [EW]    Clinical Course User Index [EW] Daleen Bo, MD     Patient Vitals for the past 24 hrs:  BP Temp Temp src Pulse Resp SpO2 Height Weight  12/11/17 1611 - 98.6 F (37 C) Oral - - - - -  12/11/17 1530 (!) 183/101 - - (!) 57 17 98 % - -  12/11/17 1515 - - - 60 15 97 % - -  12/11/17 1500 (!) 194/97 - - 65 18 97 % - -  12/11/17 1330 (!) 185/88 - - (!) 52 20 97 % - -  12/11/17 1300 (!) 177/94 - - (!) 51 11 98 % - -  12/11/17 1229 - - - 63 14 100 % - -  12/11/17 1203 (!) 196/104 - - 68 - 99 % - -  12/11/17 1100 - - - - - - 5\' 9"  (1.753 m) 102.1 kg (225 lb)  12/11/17 1042 (!) 200/103 98.3 F (36.8 C) Oral 62 20 99 % - -    4:13 PM Reevaluation with update and discussion. After initial assessment and treatment, an updated evaluation reveals no change in clinical status, he is anxious  about his blood pressure being up, but relates he is not his medicine  today.  I discussed the patient he again asserts that he does not have fever, and no chest pain.  We discussed the CT abnormality as possible pneumonia and he agrees with treating it and following up with his PCP.  All questions answered. Daleen Bo   Patient with multiple complaints, with most acute problem being dyspnea.  Conference of evaluation has been done.  Possible pneumonia, doubt sepsis, doubt ACS, doubt PE, doubt impending vascular collapse.  Nonspecific complaints of gynecomastia and penis pain are present, but do not appear to be acute.  Nursing Notes Reviewed/ Care Coordinated Applicable Imaging Reviewed Interpretation of Laboratory Data incorporated into ED treatment  The patient appears reasonably screened and/or stabilized for discharge and I doubt any other medical condition or other [facility] requiring further screening, evaluation, or treatment in the ED at this time prior to discharge.  Plan: Home Medications-continue current medications; Home Treatments-rest, fluids; return here if the recommended treatment, does not improve the symptoms; Recommended follow up-PCP, checkup 1 week and as needed    Final Clinical Impressions(s) / ED Diagnoses   Final diagnoses:  Shortness of breath    ED Discharge Orders    None          Daleen Bo, MD 12/11/17 1620

## 2017-12-11 NOTE — ED Notes (Signed)
Pt expressed concerns over elevated BP, pt reports he has not taken his BP meds today. Dr Eulis Foster notified. Pt and family updated to plan and delay.

## 2017-12-11 NOTE — ED Triage Notes (Signed)
Pt. Stated, Ive had SOB after I cut my grass a week ago. and I think it started before then.

## 2017-12-11 NOTE — Discharge Instructions (Addendum)
The testing today did not reveal any serious problems.  CAT scan indicated you may have a pneumonia and this could account for your trouble breathing.  We are prescribing an antibiotic to see if that helps.  Your other concerns, can be addressed by your PCP, when you follow-up with him in 1 week.  Return here, if needed, for problems.

## 2017-12-30 DIAGNOSIS — I129 Hypertensive chronic kidney disease with stage 1 through stage 4 chronic kidney disease, or unspecified chronic kidney disease: Secondary | ICD-10-CM | POA: Diagnosis not present

## 2017-12-30 DIAGNOSIS — Z6835 Body mass index (BMI) 35.0-35.9, adult: Secondary | ICD-10-CM | POA: Diagnosis not present

## 2017-12-30 DIAGNOSIS — N183 Chronic kidney disease, stage 3 (moderate): Secondary | ICD-10-CM | POA: Diagnosis not present

## 2018-01-06 ENCOUNTER — Ambulatory Visit (HOSPITAL_COMMUNITY)
Admission: RE | Admit: 2018-01-06 | Discharge: 2018-01-06 | Disposition: A | Discharge status: Home / Self Care | Payer: Medicare HMO | Source: Ambulatory Visit | Attending: Nurse Practitioner | Admitting: Nurse Practitioner

## 2018-01-06 ENCOUNTER — Encounter (HOSPITAL_COMMUNITY): Payer: Self-pay | Admitting: Nurse Practitioner

## 2018-01-06 VITALS — BP 144/88 | HR 59 | Ht 69.0 in | Wt 245.0 lb

## 2018-01-06 DIAGNOSIS — R9431 Abnormal electrocardiogram [ECG] [EKG]: Secondary | ICD-10-CM

## 2018-01-06 DIAGNOSIS — I10 Essential (primary) hypertension: Secondary | ICD-10-CM | POA: Diagnosis not present

## 2018-01-06 DIAGNOSIS — I480 Paroxysmal atrial fibrillation: Secondary | ICD-10-CM | POA: Diagnosis not present

## 2018-01-06 DIAGNOSIS — Z79899 Other long term (current) drug therapy: Secondary | ICD-10-CM | POA: Diagnosis not present

## 2018-01-06 DIAGNOSIS — I447 Left bundle-branch block, unspecified: Secondary | ICD-10-CM | POA: Diagnosis not present

## 2018-01-06 DIAGNOSIS — M109 Gout, unspecified: Secondary | ICD-10-CM | POA: Insufficient documentation

## 2018-01-06 DIAGNOSIS — R002 Palpitations: Secondary | ICD-10-CM | POA: Diagnosis not present

## 2018-01-06 DIAGNOSIS — Z7901 Long term (current) use of anticoagulants: Secondary | ICD-10-CM | POA: Diagnosis not present

## 2018-01-06 DIAGNOSIS — R001 Bradycardia, unspecified: Secondary | ICD-10-CM | POA: Diagnosis not present

## 2018-01-06 DIAGNOSIS — I4891 Unspecified atrial fibrillation: Secondary | ICD-10-CM | POA: Diagnosis not present

## 2018-01-06 MED ORDER — RIVAROXABAN 20 MG PO TABS: 20.0000 mg | ORAL_TABLET | Freq: Every day | ORAL | 3 refills | Status: DC

## 2018-01-06 MED ORDER — RIVAROXABAN 20 MG PO TABS: 20.0000 mg | ORAL_TABLET | Freq: Every day | ORAL | 0 refills | Status: DC

## 2018-01-06 NOTE — Progress Notes (Signed)
Primary Care Physician: Maury Dus, MD Referring Physician: Dr. Moreen Fowler  PCP: Dr. Maury Dus   [name] [name] is a 72 y.o. male with a h/o arthritis, gout, HTN that asked to be seen at PCP office today for sensation of palpitations. EKg showed afib at 107 bpm. He was referred to the afib clinic same day. Ekg now shows NSR with LBBB. He had an EKG in the ER which also showed LBBB, but pt is not sure when he last had an EKG, so unsure if new or old LBBB. At the time of the ER visit, he has shortness of breath and it was felt he might have pneumonia and was given antibiotics. This improved his symptoms. He remembers an episode one other time that he felt palpitations in the last few weeks. In SR he is in the 50's, so hesitate to increase daily metoprolol. He does snore, has never had a sleep study. + for daytime somnolence.  No alcohol, tobacco, excessive caffeine.  He denies a bleeding history. Chadsvasc score of 4(age, HTN, H/o remote TIA's x 2) He denies chest pain/ shortness of breath but is sedentary. He is under stress, sleeps poorly as his wife has Alzheimer's.and roams at night. 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  Today, he denies symptoms of palpitations, chest pain, shortness of breath, orthopnea, PND, lower extremity edema, dizziness, presyncope, syncope, or neurologic sequela. The patient is tolerating medications without difficulties and is otherwise without complaint today.   Past Medical History:  Diagnosis Date  . Arthritis   . Gout   . Heart murmur   . Hypertension    Past Surgical History:  Procedure Laterality Date  . HERNIA REPAIR      Current Outpatient Medications  Medication Sig Dispense Refill  . allopurinol (ZYLOPRIM) 300 MG tablet Take 300 mg by mouth daily as needed (gout).     Marland Kitchen amLODipine (NORVASC) 10 MG tablet Take 10 mg by mouth daily.    . colchicine 0.6 MG tablet Take 0.6 mg by mouth  daily as needed (for gout).     . Colesevelam HCl (WELCHOL PO) Take 1 tablet by mouth daily as needed (high cholesterol).    . diphenhydrAMINE (BENADRYL) 25 mg capsule Take 25 mg by mouth every 6 (six) hours as needed for itching.    . docusate sodium (COLACE) 100 MG capsule Take 1 capsule (100 mg total) by mouth every 12 (twelve) hours. (Patient taking differently: Take 100 mg by mouth 2 (two) times daily as needed for moderate constipation. ) 60 capsule 0  . dutasteride (AVODART) 0.5 MG capsule Take 0.5 mg by mouth at bedtime.   5  . gabapentin (NEURONTIN) 300 MG capsule Take 1 capsule (300 mg total) by mouth 3 (three) times daily as needed. (Patient taking differently: Take 300 mg by mouth 3 (three) times daily. ) 90 capsule 0  . HYDROcodone-acetaminophen (NORCO/VICODIN) 5-325 MG tablet Take 1-2 tablets by mouth every 6 (six) hours as needed for moderate pain.    . methocarbamol (ROBAXIN) 500 MG tablet Take 1 tablet (500 mg total) by mouth 3 (three) times daily between meals as needed. (Patient taking differently: Take 500 mg by mouth 3 (three) times daily between meals as needed for muscle spasms. ) 20 tablet 0  . metoprolol succinate (TOPROL-XL) 100 MG 24 hr tablet Take 100 mg by mouth daily.    . Multiple Vitamin (MULTIVITAMIN WITH  MINERALS) TABS tablet Take 1 tablet by mouth daily.    . polyethylene glycol powder (MIRALAX) powder TAKE 6 CAPFULS OF MIRALAX IN A 32 OUNCE GATORADE AND DRINK THE WHOLE BEVERAGE FOLLOWED BY 3 CAPFULS TWICE A DAY FOR THE NEXT WEEK AND FOLLOW UP WITH YOUR PRIMARY CARE PHYSICIAN. (Patient taking differently: Take 17 g by mouth daily as needed for moderate constipation. ) 500 g 0  . telmisartan (MICARDIS) 80 MG tablet Take 80 mg by mouth daily.    . traMADol (ULTRAM) 50 MG tablet Take 50-100 mg by mouth every 6 (six) hours as needed for moderate pain.     Marland Kitchen triamcinolone (NASACORT) 55 MCG/ACT AERO nasal inhaler Place 2 sprays into the nose at bedtime as needed  (congestion).    . rivaroxaban (XARELTO) 20 MG TABS tablet Take 1 tablet (20 mg total) by mouth daily with supper. 30 tablet 0   No current facility-administered medications for this encounter.     No Known Allergies  Social History   Socioeconomic History  . Marital status: Single    Spouse name: Not on file  . Number of children: Not on file  . Years of education: Not on file  . Highest education level: Not on file  Occupational History  . Not on file  Social Needs  . Financial resource strain: Not on file  . Food insecurity:    Worry: Not on file    Inability: Not on file  . Transportation needs:    Medical: Not on file    Non-medical: Not on file  Tobacco Use  . Smoking status: Never Smoker  . Smokeless tobacco: Never Used  Substance and Sexual Activity  . Alcohol use: No  . Drug use: No  . Sexual activity: Not on file  Lifestyle  . Physical activity:    Days per week: Not on file    Minutes per session: Not on file  . Stress: Not on file  Relationships  . Social connections:    Talks on phone: Not on file    Gets together: Not on file    Attends religious service: Not on file    Active member of club or organization: Not on file    Attends meetings of clubs or organizations: Not on file    Relationship status: Not on file  . Intimate partner violence:    Fear of current or ex partner: Not on file    Emotionally abused: Not on file    Physically abused: Not on file    Forced sexual activity: Not on file  Other Topics Concern  . Not on file  Social History Narrative  . Not on file    Family History  Problem Relation Age of Onset  . Cancer Mother   . Hypertension Mother   . Heart disease Father   . Hypertension Father   . Hypertension Sister   . Hypertension Brother     ROS- All systems are reviewed and negative except as per the HPI above  Physical Exam: Vitals:   01/06/18 1352  BP: (!) 144/88  Pulse: (!) 59  SpO2: 98%  Weight: 245 lb  (111.1 kg)  Height: 5\' 9"  (1.753 m)   Wt Readings from Last 3 Encounters:  01/06/18 245 lb (111.1 kg)  12/11/17 225 lb (102.1 kg)  04/22/16 237 lb (107.5 kg)    Labs: Lab Results  Component Value Date   NA 138 12/11/2017   K 3.8 12/11/2017   CL 104  12/11/2017   CO2 24 12/11/2017   GLUCOSE 96 12/11/2017   BUN 14 12/11/2017   CREATININE 1.55 (H) 12/11/2017   CALCIUM 9.1 12/11/2017   No results found for: INR No results found for: CHOL, HDL, LDLCALC, TRIG   GEN- The patient is well appearing, alert and oriented x 3 today.   Head- normocephalic, atraumatic Eyes-  Sclera clear, conjunctiva pink Ears- hearing intact Oropharynx- clear Neck- supple, no JVP Lymph- no cervical lymphadenopathy Lungs- Clear to ausculation bilaterally, normal work of breathing Heart- Regular rate and rhythm, no murmurs, rubs or gallops, PMI not laterally displaced GI- soft, NT, ND, + BS Extremities- no clubbing, cyanosis, or edema MS- no significant deformity or atrophy Skin- no rash or lesion Psych- euthymic mood, full affect Neuro- strength and sensation are intact  EKG-EKG reviewed form PCP office, afib with LBBB, 107 bpm EKG here Sinus brady at 59 bpm, pr int 158 ms, qrs int 154 ms, qtc 455 ms ER visit reviewed. Labs     Assessment and Plan: 1. New onset afib Now back in SR General education re afib Continue metoprolol succinate 100 mg daily Will not increase BB as he is slow in SR and was only around 100 in afib He does snore with daytime somnolence and will need sleep study to look for sleep apnea. Encouraged weight loss 30 day event monitor as to afib burden  2. Chadsvasc score of 4 No bleeding history  Bleeding precautions discussed  Will start xarelto 20 mg a day with crcl cal at 69.71 Stop asa  Avoid antiinflammatories   3.LBBB Not sure if old or new With also new onset afib with order a lexi myoview Brother has  also noted that pt is short of breath preaching  recently Echo  F/u in 2-3 weeks Will refer to general cardiology at that point  Tobias. Brenyn Petrey, [facility] [address] [place], [place] [address] [phone]

## 2018-01-06 NOTE — Patient Instructions (Addendum)
Stop aspirin  Start Xarelto 20mg  once a day with supper  Sleep study scheduler will be in touch with you once approved by your insurance.

## 2018-01-10 ENCOUNTER — Telehealth: Payer: Self-pay | Admitting: *Deleted

## 2018-01-10 NOTE — Telephone Encounter (Signed)
Submitted PA via web portal to [employer] for in lab split night sleep study.

## 2018-01-11 DIAGNOSIS — I480 Paroxysmal atrial fibrillation: Secondary | ICD-10-CM | POA: Diagnosis not present

## 2018-01-11 DIAGNOSIS — N529 Male erectile dysfunction, unspecified: Secondary | ICD-10-CM | POA: Diagnosis not present

## 2018-01-11 DIAGNOSIS — E7800 Pure hypercholesterolemia, unspecified: Secondary | ICD-10-CM | POA: Diagnosis not present

## 2018-01-11 DIAGNOSIS — I129 Hypertensive chronic kidney disease with stage 1 through stage 4 chronic kidney disease, or unspecified chronic kidney disease: Secondary | ICD-10-CM | POA: Diagnosis not present

## 2018-01-11 DIAGNOSIS — M109 Gout, unspecified: Secondary | ICD-10-CM | POA: Diagnosis not present

## 2018-01-11 DIAGNOSIS — B0229 Other postherpetic nervous system involvement: Secondary | ICD-10-CM | POA: Diagnosis not present

## 2018-01-11 DIAGNOSIS — K5900 Constipation, unspecified: Secondary | ICD-10-CM | POA: Diagnosis not present

## 2018-01-11 DIAGNOSIS — N400 Enlarged prostate without lower urinary tract symptoms: Secondary | ICD-10-CM | POA: Diagnosis not present

## 2018-01-11 DIAGNOSIS — J181 Lobar pneumonia, unspecified organism: Secondary | ICD-10-CM | POA: Diagnosis not present

## 2018-01-13 ENCOUNTER — Ambulatory Visit
Admission: RE | Admit: 2018-01-13 | Discharge: 2018-01-13 | Disposition: A | Discharge status: Home / Self Care | Payer: Medicare HMO | Source: Ambulatory Visit | Attending: Family Medicine | Admitting: Family Medicine

## 2018-01-13 ENCOUNTER — Other Ambulatory Visit: Payer: Self-pay | Admitting: Family Medicine

## 2018-01-13 DIAGNOSIS — J189 Pneumonia, unspecified organism: Secondary | ICD-10-CM | POA: Diagnosis not present

## 2018-01-13 DIAGNOSIS — J181 Lobar pneumonia, unspecified organism: Secondary | ICD-10-CM

## 2018-01-17 ENCOUNTER — Telehealth (HOSPITAL_COMMUNITY): Payer: Self-pay | Admitting: Radiology

## 2018-01-17 NOTE — Telephone Encounter (Signed)
Patient given detailed instructions per Myocardial Perfusion Study Information Sheet for the test on 01/20/2018 at 9:30. Patient notified to arrive 15 minutes early and that it is imperative to arrive on time for appointment to keep from having the test rescheduled.  If you need to cancel or reschedule your appointment, please call the office within 24 hours of your appointment. . Patient verbalized understanding.EHK

## 2018-01-19 ENCOUNTER — Other Ambulatory Visit: Payer: Self-pay | Admitting: Cardiovascular Disease

## 2018-01-19 ENCOUNTER — Telehealth: Payer: Self-pay | Admitting: *Deleted

## 2018-01-19 DIAGNOSIS — R0683 Snoring: Secondary | ICD-10-CM

## 2018-01-19 DIAGNOSIS — R400 Somnolence: Secondary | ICD-10-CM

## 2018-01-20 ENCOUNTER — Ambulatory Visit (HOSPITAL_COMMUNITY): Payer: Medicare HMO | Attending: Cardiology

## 2018-01-20 ENCOUNTER — Ambulatory Visit (HOSPITAL_BASED_OUTPATIENT_CLINIC_OR_DEPARTMENT_OTHER): Payer: Medicare HMO

## 2018-01-20 ENCOUNTER — Other Ambulatory Visit: Payer: Self-pay

## 2018-01-20 ENCOUNTER — Ambulatory Visit (INDEPENDENT_AMBULATORY_CARE_PROVIDER_SITE_OTHER): Payer: Medicare HMO

## 2018-01-20 DIAGNOSIS — I10 Essential (primary) hypertension: Secondary | ICD-10-CM | POA: Insufficient documentation

## 2018-01-20 DIAGNOSIS — E669 Obesity, unspecified: Secondary | ICD-10-CM | POA: Diagnosis not present

## 2018-01-20 DIAGNOSIS — R9439 Abnormal result of other cardiovascular function study: Secondary | ICD-10-CM | POA: Diagnosis not present

## 2018-01-20 DIAGNOSIS — I4891 Unspecified atrial fibrillation: Secondary | ICD-10-CM | POA: Insufficient documentation

## 2018-01-20 DIAGNOSIS — I340 Nonrheumatic mitral (valve) insufficiency: Secondary | ICD-10-CM | POA: Insufficient documentation

## 2018-01-20 DIAGNOSIS — R9431 Abnormal electrocardiogram [ECG] [EKG]: Secondary | ICD-10-CM

## 2018-01-20 DIAGNOSIS — I480 Paroxysmal atrial fibrillation: Secondary | ICD-10-CM | POA: Insufficient documentation

## 2018-01-20 DIAGNOSIS — I447 Left bundle-branch block, unspecified: Secondary | ICD-10-CM

## 2018-01-20 DIAGNOSIS — R011 Cardiac murmur, unspecified: Secondary | ICD-10-CM | POA: Diagnosis not present

## 2018-01-20 LAB — MYOCARDIAL PERFUSION IMAGING
CSEPPHR: 69 {beats}/min
LV sys vol: 104 mL
LVDIAVOL: 176 mL (ref 62–150)
RATE: 0.38
Rest HR: 45 {beats}/min
SDS: 2
SRS: 3
SSS: 5
TID: 0.98

## 2018-01-20 MED ORDER — TECHNETIUM TC 99M TETROFOSMIN IV KIT
32.9000 | PACK | Freq: Once | INTRAVENOUS | Status: AC | PRN
  Administered 2018-01-20: 32.9 via INTRAVENOUS
  Filled 2018-01-20: qty 33

## 2018-01-20 MED ORDER — REGADENOSON 0.4 MG/5ML IV SOLN
0.4000 mg | Freq: Once | INTRAVENOUS | Status: AC
  Administered 2018-01-20: 0.4 mg via INTRAVENOUS

## 2018-01-20 MED ORDER — TECHNETIUM TC 99M TETROFOSMIN IV KIT
10.7000 | PACK | Freq: Once | INTRAVENOUS | Status: AC | PRN
  Administered 2018-01-20: 10.7 via INTRAVENOUS
  Filled 2018-01-20: qty 11

## 2018-01-20 NOTE — Telephone Encounter (Signed)
Patient notified insurance denied in lab sleep study. HST sleep study scheduled for 02/15/18 @ 1:15 pm. Patient voiced verbal understanding of HST appointment details.

## 2018-01-25 DIAGNOSIS — I129 Hypertensive chronic kidney disease with stage 1 through stage 4 chronic kidney disease, or unspecified chronic kidney disease: Secondary | ICD-10-CM | POA: Diagnosis not present

## 2018-01-26 ENCOUNTER — Ambulatory Visit (HOSPITAL_COMMUNITY): Payer: Medicare HMO | Admitting: Nurse Practitioner

## 2018-01-26 DIAGNOSIS — M109 Gout, unspecified: Secondary | ICD-10-CM | POA: Diagnosis not present

## 2018-01-26 DIAGNOSIS — I10 Essential (primary) hypertension: Secondary | ICD-10-CM | POA: Diagnosis not present

## 2018-01-26 DIAGNOSIS — N184 Chronic kidney disease, stage 4 (severe): Secondary | ICD-10-CM | POA: Diagnosis not present

## 2018-01-26 DIAGNOSIS — R413 Other amnesia: Secondary | ICD-10-CM | POA: Diagnosis not present

## 2018-01-26 DIAGNOSIS — E785 Hyperlipidemia, unspecified: Secondary | ICD-10-CM | POA: Diagnosis not present

## 2018-01-26 DIAGNOSIS — Z01419 Encounter for gynecological examination (general) (routine) without abnormal findings: Secondary | ICD-10-CM | POA: Diagnosis not present

## 2018-01-27 ENCOUNTER — Encounter (HOSPITAL_COMMUNITY): Payer: Self-pay | Admitting: Nurse Practitioner

## 2018-01-27 ENCOUNTER — Ambulatory Visit (HOSPITAL_COMMUNITY)
Admission: RE | Admit: 2018-01-27 | Discharge: 2018-01-27 | Disposition: A | Discharge status: Home / Self Care | Payer: Medicare HMO | Source: Ambulatory Visit | Attending: Nurse Practitioner | Admitting: Nurse Practitioner

## 2018-01-27 ENCOUNTER — Other Ambulatory Visit: Payer: Self-pay

## 2018-01-27 VITALS — BP 150/86 | HR 54 | Ht 69.0 in | Wt 251.0 lb

## 2018-01-27 DIAGNOSIS — M109 Gout, unspecified: Secondary | ICD-10-CM | POA: Insufficient documentation

## 2018-01-27 DIAGNOSIS — Z6837 Body mass index (BMI) 37.0-37.9, adult: Secondary | ICD-10-CM | POA: Diagnosis not present

## 2018-01-27 DIAGNOSIS — I447 Left bundle-branch block, unspecified: Secondary | ICD-10-CM | POA: Diagnosis not present

## 2018-01-27 DIAGNOSIS — R0683 Snoring: Secondary | ICD-10-CM | POA: Insufficient documentation

## 2018-01-27 DIAGNOSIS — I10 Essential (primary) hypertension: Secondary | ICD-10-CM | POA: Diagnosis not present

## 2018-01-27 DIAGNOSIS — M1990 Unspecified osteoarthritis, unspecified site: Secondary | ICD-10-CM | POA: Diagnosis not present

## 2018-01-27 DIAGNOSIS — Z7901 Long term (current) use of anticoagulants: Secondary | ICD-10-CM | POA: Insufficient documentation

## 2018-01-27 DIAGNOSIS — I4891 Unspecified atrial fibrillation: Secondary | ICD-10-CM | POA: Insufficient documentation

## 2018-01-27 DIAGNOSIS — R0609 Other forms of dyspnea: Secondary | ICD-10-CM

## 2018-01-27 DIAGNOSIS — R0600 Dyspnea, unspecified: Secondary | ICD-10-CM

## 2018-01-27 DIAGNOSIS — E669 Obesity, unspecified: Secondary | ICD-10-CM | POA: Insufficient documentation

## 2018-01-27 DIAGNOSIS — Z8249 Family history of ischemic heart disease and other diseases of the circulatory system: Secondary | ICD-10-CM | POA: Insufficient documentation

## 2018-01-27 DIAGNOSIS — Z79899 Other long term (current) drug therapy: Secondary | ICD-10-CM | POA: Diagnosis not present

## 2018-01-27 NOTE — H&P (View-Only) (Signed)
Primary Care Physician: Maury Dus, MD Referring Physician: Dr. Moreen Fowler  PCP: Dr. Maury Dus   [name] [name] is a 72 y.o. male  Minister with a h/o arthritis, gout, HTN that asked to be seen at PCP office  01/06/18  for sensation of palpitations. EKg showed afib at 107 bpm. He was referred to the afib clinic same day. Ekg now shows NSR with LBBB. He had an EKG in the ER which also showed LBBB, but pt is not sure when he last had an EKG, so unsure if new or old LBBB. At the time of the ER visit, 12/11/17, he had shortness of breath and it was felt he might have pneumonia and was given antibiotics. This improved his symptoms. He remembers an episode one other time that he felt palpitations in the last few weeks. In SR he is in the 50's, so hesitate to increase [name] metoprolol. He does snore, has never had a sleep study. + for daytime somnolence.  No alcohol, tobacco, excessive caffeine.  He denies a bleeding history. Chadsvasc score of 4(age, HTN, H/o remote TIA's x 2) He denies chest pain/ shortness of breath but is sedentary. He is under stress, sleeps poorly as his wife has Alzheimer's.and roams at night. 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 Has noted more shortness of breath with walking.  F/u in afib clinic, 5/31.  Reviewed tests with pt with echo showing reduced EF as well as stress test with suggestion of prior infarct and peri infarct ischemia. He is still wearing 30 monitor. HAs not really noted any further palpations. Discussed results of stress test with Dr. Marlou Porch . With LBBB and pt's c/o  exertional dyspnea, he feels that cardiac catherization would be reasonable approach. Pt is in agreement.  Today, he denies symptoms of palpitations, chest pain,  orthopnea, PND, lower extremity edema, dizziness, presyncope, syncope, or neurologic sequela. + for exertional shortness of  breath.The patient is tolerating medications  without difficulties and is otherwise without complaint today.   Past Medical History:  Diagnosis Date  . Arthritis   . Gout   . Heart murmur   . Hypertension    Past Surgical History:  Procedure Laterality Date  . HERNIA REPAIR      Current Outpatient Medications  Medication Sig Dispense Refill  . allopurinol (ZYLOPRIM) 300 MG tablet Take 300 mg by mouth [name] as needed (gout).     Marland Kitchen amLODipine (NORVASC) 10 MG tablet Take 10 mg by mouth [name].    . colchicine 0.6 MG tablet Take 0.6 mg by mouth [name] as needed (for gout).     . Colesevelam HCl (WELCHOL PO) Take 1 tablet by mouth [name] as needed (high cholesterol).    . diphenhydrAMINE (BENADRYL) 25 mg capsule Take 25 mg by mouth every 6 (six) hours as needed for itching.    . docusate sodium (COLACE) 100 MG capsule Take 1 capsule (100 mg total) by mouth every 12 (twelve) hours. (Patient taking differently: Take 100 mg by mouth 2 (two) times [name] as needed for moderate constipation. ) 60 capsule 0  . dutasteride (AVODART) 0.5 MG capsule Take 0.5 mg by mouth at bedtime.   5  . ezetimibe (ZETIA) 10 MG tablet Take 10 mg by mouth [name].  3  . gabapentin (NEURONTIN) 300 MG capsule Take 1 capsule (300 mg total) by mouth 3 (three) times [name] as needed. (Patient taking differently: Take  300 mg by mouth 3 (three) times [name]. ) 90 capsule 0  . HYDROcodone-acetaminophen (NORCO/VICODIN) 5-325 MG tablet Take 1-2 tablets by mouth every 6 (six) hours as needed for moderate pain.    . methocarbamol (ROBAXIN) 500 MG tablet Take 1 tablet (500 mg total) by mouth 3 (three) times [name] between meals as needed. (Patient taking differently: Take 500 mg by mouth 3 (three) times [name] between meals as needed for muscle spasms. ) 20 tablet 0  . metoprolol succinate (TOPROL-XL) 100 MG 24 hr tablet Take 100 mg by mouth [name].    . Multiple Vitamin (MULTIVITAMIN WITH MINERALS) TABS tablet Take 1 tablet by mouth [name].    . polyethylene glycol powder (MIRALAX)  powder TAKE 6 CAPFULS OF MIRALAX IN A 32 OUNCE GATORADE AND DRINK THE WHOLE BEVERAGE FOLLOWED BY 3 CAPFULS TWICE A DAY FOR THE NEXT WEEK AND FOLLOW UP WITH YOUR PRIMARY CARE PHYSICIAN. (Patient taking differently: Take 17 g by mouth [name] as needed for moderate constipation. ) 500 g 0  . rivaroxaban (XARELTO) 20 MG TABS tablet Take 1 tablet (20 mg total) by mouth [name] with supper. 30 tablet 3  . traMADol (ULTRAM) 50 MG tablet Take 50-100 mg by mouth every 6 (six) hours as needed for moderate pain.     Marland Kitchen triamcinolone (NASACORT) 55 MCG/ACT AERO nasal inhaler Place 2 sprays into the nose at bedtime as needed (congestion).    Marland Kitchen olmesartan (BENICAR) 40 MG tablet Take 40 mg by mouth [name].     No current facility-administered medications for this encounter.     No Known Allergies  Social History   Socioeconomic History  . Marital status: Single    Spouse name: Not on file  . Number of children: Not on file  . Years of education: Not on file  . Highest education level: Not on file  Occupational History  . Not on file  Social Needs  . Financial resource strain: Not on file  . Food insecurity:    Worry: Not on file    Inability: Not on file  . Transportation needs:    Medical: Not on file    Non-medical: Not on file  Tobacco Use  . Smoking status: Never Smoker  . Smokeless tobacco: Never Used  Substance and Sexual Activity  . Alcohol use: No  . Drug use: No  . Sexual activity: Not on file  Lifestyle  . Physical activity:    Days per week: Not on file    Minutes per session: Not on file  . Stress: Not on file  Relationships  . Social connections:    Talks on phone: Not on file    Gets together: Not on file    Attends religious service: Not on file    Active member of club or organization: Not on file    Attends meetings of clubs or organizations: Not on file    Relationship status: Not on file  . Intimate partner violence:    Fear of current or ex partner: Not on file     Emotionally abused: Not on file    Physically abused: Not on file    Forced sexual activity: Not on file  Other Topics Concern  . Not on file  Social History Narrative  . Not on file    Family History  Problem Relation Age of Onset  . Cancer Mother   . Hypertension Mother   . Heart disease Father   . Hypertension Father   .  Hypertension Sister   . Hypertension Brother     ROS- All systems are reviewed and negative except as per the HPI above  Physical Exam: Vitals:   01/27/18 1158  BP: (!) 150/86  Pulse: (!) 54  Weight: 251 lb (113.9 kg)  Height: 5\' 9"  (1.753 m)   Wt Readings from Last 3 Encounters:  01/27/18 251 lb (113.9 kg)  01/06/18 245 lb (111.1 kg)  12/11/17 225 lb (102.1 kg)    Labs: Lab Results  Component Value Date   NA 138 12/11/2017   K 3.8 12/11/2017   CL 104 12/11/2017   CO2 24 12/11/2017   GLUCOSE 96 12/11/2017   BUN 14 12/11/2017   CREATININE 1.55 (H) 12/11/2017   CALCIUM 9.1 12/11/2017   No results found for: INR No results found for: CHOL, HDL, LDLCALC, TRIG   GEN- The patient is well appearing, alert and oriented x 3 today.   Head- normocephalic, atraumatic Eyes-  Sclera clear, conjunctiva pink Ears- hearing intact Oropharynx- clear Neck- supple, no JVP Lymph- no cervical lymphadenopathy Lungs- Clear to ausculation bilaterally, normal work of breathing Heart- Regular rate and rhythm, no murmurs, rubs or gallops, PMI not laterally displaced GI- soft, NT, ND, + BS Extremities- no clubbing, cyanosis, or edema MS- no significant deformity or atrophy Skin- no rash or lesion Psych- euthymic mood, full affect Neuro- strength and sensation are intact  EKG-EKG reviewed form PCP office, afib with LBBB, 107 bpm EKG  Sinus brady at 54 bpm, pr int 160 ms, qrs int 168 ms, qtc 438 ms Echo-LV EF: 45% -   50%  ------------------------------------------------------------------- Indications:       (I48.91).  ------------------------------------------------------------------- History:   PMH:  LBBB. Acquired from the patient and from the patient&'s chart.  Murmur.  Atrial fibrillation.  Risk factors: Hypertension. Obese.  ------------------------------------------------------------------- Study Conclusions  - Left ventricle: The cavity size was normal. Wall thickness was   increased in a pattern of moderate LVH. Systolic function was   mildly reduced. The estimated ejection fraction was in the range   of 45% to 50%. Diffuse hypokinesis. Doppler parameters are   consistent with abnormal left ventricular relaxation (grade 1   diastolic dysfunction). - Ventricular septum: Septal motion showed &quot;bounce&quot;. - Mitral valve: There was mild regurgitation. - Left atrium: The atrium was mildly dilated.  Lexi Myoview-Study Highlights     Nuclear stress EF: 41%.  There was no ST segment deviation noted during stress.  Findings consistent with prior myocardial infarction with peri-infarct ischemia.  This is an intermediate risk study.  The left ventricular ejection fraction is moderately decreased (30-44%).       Assessment and Plan: 1. New onset brief episode  afib Now back in SR Wearing 30 day event monitor Continue metoprolol succinate 100 mg [name] Did not increase BB as he is slow in SR and was only around 100 in afib He does snore with daytime somnolence and is pending home sleep study Encouraged weight loss   2. Chadsvasc score of 4 No bleeding history  Bleeding precautions discussed  Continue  xarelto 20 mg a day with crcl cal at 69.71 Off asa  Avoid antiinflammatories   3.LBBB Not sure if old or new Lexi Myoview suggests Old MI with peri infarct ischemia and reduced EF He has shortness of breath with activity Brother has  also noted that pt is short of breath preaching recently Will schedule pt for L/R heart cath  He will need to stop xarelto  prior  to the procedure Will get scheduled and let pt know the details  Will refer to general cardiology after results of cath are known  Butch Penny C. Carroll, [facility] [address] [place], [place] [address] [phone]

## 2018-01-27 NOTE — Progress Notes (Addendum)
Primary Care Physician: Maury Dus, MD Referring Physician: Dr. Moreen Fowler  PCP: Dr. Maury Dus   [name] [name] is a 72 y.o. male  Minister with a h/o arthritis, gout, HTN that asked to be seen at PCP office  01/06/18  for sensation of palpitations. EKg showed afib at 107 bpm. He was referred to the afib clinic same day. Ekg now shows NSR with LBBB. He had an EKG in the ER which also showed LBBB, but pt is not sure when he last had an EKG, so unsure if new or old LBBB. At the time of the ER visit, 12/11/17, he had shortness of breath and it was felt he might have pneumonia and was given antibiotics. This improved his symptoms. He remembers an episode one other time that he felt palpitations in the last few weeks. In SR he is in the 50's, so hesitate to increase daily metoprolol. He does snore, has never had a sleep study. + for daytime somnolence.  No alcohol, tobacco, excessive caffeine.  He denies a bleeding history. Chadsvasc score of 4(age, HTN, H/o remote TIA's x 2) He denies chest pain/ shortness of breath but is sedentary. He is under stress, sleeps poorly as his wife has Alzheimer's.and roams at night. 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 Has noted more shortness of breath with walking.  F/u in afib clinic, 5/31.  Reviewed tests with pt with echo showing reduced EF as well as stress test with suggestion of prior infarct and peri infarct ischemia. He is still wearing 30 monitor. HAs not really noted any further palpations. Discussed results of stress test with Dr. Marlou Porch . With LBBB and pt's c/o  exertional dyspnea, he feels that cardiac catherization would be reasonable approach. Pt is in agreement.  Today, he denies symptoms of palpitations, chest pain,  orthopnea, PND, lower extremity edema, dizziness, presyncope, syncope, or neurologic sequela. + for exertional shortness of  breath.The patient is tolerating medications  without difficulties and is otherwise without complaint today.   Past Medical History:  Diagnosis Date  . Arthritis   . Gout   . Heart murmur   . Hypertension    Past Surgical History:  Procedure Laterality Date  . HERNIA REPAIR      Current Outpatient Medications  Medication Sig Dispense Refill  . allopurinol (ZYLOPRIM) 300 MG tablet Take 300 mg by mouth daily as needed (gout).     Marland Kitchen amLODipine (NORVASC) 10 MG tablet Take 10 mg by mouth daily.    . colchicine 0.6 MG tablet Take 0.6 mg by mouth daily as needed (for gout).     . Colesevelam HCl (WELCHOL PO) Take 1 tablet by mouth daily as needed (high cholesterol).    . diphenhydrAMINE (BENADRYL) 25 mg capsule Take 25 mg by mouth every 6 (six) hours as needed for itching.    . docusate sodium (COLACE) 100 MG capsule Take 1 capsule (100 mg total) by mouth every 12 (twelve) hours. (Patient taking differently: Take 100 mg by mouth 2 (two) times daily as needed for moderate constipation. ) 60 capsule 0  . dutasteride (AVODART) 0.5 MG capsule Take 0.5 mg by mouth at bedtime.   5  . ezetimibe (ZETIA) 10 MG tablet Take 10 mg by mouth daily.  3  . gabapentin (NEURONTIN) 300 MG capsule Take 1 capsule (300 mg total) by mouth 3 (three) times daily as needed. (Patient taking differently: Take  300 mg by mouth 3 (three) times daily. ) 90 capsule 0  . HYDROcodone-acetaminophen (NORCO/VICODIN) 5-325 MG tablet Take 1-2 tablets by mouth every 6 (six) hours as needed for moderate pain.    . methocarbamol (ROBAXIN) 500 MG tablet Take 1 tablet (500 mg total) by mouth 3 (three) times daily between meals as needed. (Patient taking differently: Take 500 mg by mouth 3 (three) times daily between meals as needed for muscle spasms. ) 20 tablet 0  . metoprolol succinate (TOPROL-XL) 100 MG 24 hr tablet Take 100 mg by mouth daily.    . Multiple Vitamin (MULTIVITAMIN WITH MINERALS) TABS tablet Take 1 tablet by mouth daily.    . polyethylene glycol powder (MIRALAX)  powder TAKE 6 CAPFULS OF MIRALAX IN A 32 OUNCE GATORADE AND DRINK THE WHOLE BEVERAGE FOLLOWED BY 3 CAPFULS TWICE A DAY FOR THE NEXT WEEK AND FOLLOW UP WITH YOUR PRIMARY CARE PHYSICIAN. (Patient taking differently: Take 17 g by mouth daily as needed for moderate constipation. ) 500 g 0  . rivaroxaban (XARELTO) 20 MG TABS tablet Take 1 tablet (20 mg total) by mouth daily with supper. 30 tablet 3  . traMADol (ULTRAM) 50 MG tablet Take 50-100 mg by mouth every 6 (six) hours as needed for moderate pain.     Marland Kitchen triamcinolone (NASACORT) 55 MCG/ACT AERO nasal inhaler Place 2 sprays into the nose at bedtime as needed (congestion).    Marland Kitchen olmesartan (BENICAR) 40 MG tablet Take 40 mg by mouth daily.     No current facility-administered medications for this encounter.     No Known Allergies  Social History   Socioeconomic History  . Marital status: Single    Spouse name: Not on file  . Number of children: Not on file  . Years of education: Not on file  . Highest education level: Not on file  Occupational History  . Not on file  Social Needs  . Financial resource strain: Not on file  . Food insecurity:    Worry: Not on file    Inability: Not on file  . Transportation needs:    Medical: Not on file    Non-medical: Not on file  Tobacco Use  . Smoking status: Never Smoker  . Smokeless tobacco: Never Used  Substance and Sexual Activity  . Alcohol use: No  . Drug use: No  . Sexual activity: Not on file  Lifestyle  . Physical activity:    Days per week: Not on file    Minutes per session: Not on file  . Stress: Not on file  Relationships  . Social connections:    Talks on phone: Not on file    Gets together: Not on file    Attends religious service: Not on file    Active member of club or organization: Not on file    Attends meetings of clubs or organizations: Not on file    Relationship status: Not on file  . Intimate partner violence:    Fear of current or ex partner: Not on file     Emotionally abused: Not on file    Physically abused: Not on file    Forced sexual activity: Not on file  Other Topics Concern  . Not on file  Social History Narrative  . Not on file    Family History  Problem Relation Age of Onset  . Cancer Mother   . Hypertension Mother   . Heart disease Father   . Hypertension Father   .  Hypertension Sister   . Hypertension Brother     ROS- All systems are reviewed and negative except as per the HPI above  Physical Exam: Vitals:   01/27/18 1158  BP: (!) 150/86  Pulse: (!) 54  Weight: 251 lb (113.9 kg)  Height: 5\' 9"  (1.753 m)   Wt Readings from Last 3 Encounters:  01/27/18 251 lb (113.9 kg)  01/06/18 245 lb (111.1 kg)  12/11/17 225 lb (102.1 kg)    Labs: Lab Results  Component Value Date   NA 138 12/11/2017   K 3.8 12/11/2017   CL 104 12/11/2017   CO2 24 12/11/2017   GLUCOSE 96 12/11/2017   BUN 14 12/11/2017   CREATININE 1.55 (H) 12/11/2017   CALCIUM 9.1 12/11/2017   No results found for: INR No results found for: CHOL, HDL, LDLCALC, TRIG   GEN- The patient is well appearing, alert and oriented x 3 today.   Head- normocephalic, atraumatic Eyes-  Sclera clear, conjunctiva pink Ears- hearing intact Oropharynx- clear Neck- supple, no JVP Lymph- no cervical lymphadenopathy Lungs- Clear to ausculation bilaterally, normal work of breathing Heart- Regular rate and rhythm, no murmurs, rubs or gallops, PMI not laterally displaced GI- soft, NT, ND, + BS Extremities- no clubbing, cyanosis, or edema MS- no significant deformity or atrophy Skin- no rash or lesion Psych- euthymic mood, full affect Neuro- strength and sensation are intact  EKG-EKG reviewed form PCP office, afib with LBBB, 107 bpm EKG  Sinus brady at 54 bpm, pr int 160 ms, qrs int 168 ms, qtc 438 ms Echo-LV EF: 45% -   50%  ------------------------------------------------------------------- Indications:       (I48.91).  ------------------------------------------------------------------- History:   PMH:  LBBB. Acquired from the patient and from the patient&'s chart.  Murmur.  Atrial fibrillation.  Risk factors: Hypertension. Obese.  ------------------------------------------------------------------- Study Conclusions  - Left ventricle: The cavity size was normal. Wall thickness was   increased in a pattern of moderate LVH. Systolic function was   mildly reduced. The estimated ejection fraction was in the range   of 45% to 50%. Diffuse hypokinesis. Doppler parameters are   consistent with abnormal left ventricular relaxation (grade 1   diastolic dysfunction). - Ventricular septum: Septal motion showed &quot;bounce&quot;. - Mitral valve: There was mild regurgitation. - Left atrium: The atrium was mildly dilated.  Lexi Myoview-Study Highlights     Nuclear stress EF: 41%.  There was no ST segment deviation noted during stress.  Findings consistent with prior myocardial infarction with peri-infarct ischemia.  This is an intermediate risk study.  The left ventricular ejection fraction is moderately decreased (30-44%).       Assessment and Plan: 1. New onset brief episode  afib Now back in SR Wearing 30 day event monitor Continue metoprolol succinate 100 mg daily Did not increase BB as he is slow in SR and was only around 100 in afib He does snore with daytime somnolence and is pending home sleep study Encouraged weight loss   2. Chadsvasc score of 4 No bleeding history  Bleeding precautions discussed  Continue  xarelto 20 mg a day with crcl cal at 69.71 Off asa  Avoid antiinflammatories   3.LBBB Not sure if old or new Lexi Myoview suggests Old MI with peri infarct ischemia and reduced EF He has shortness of breath with activity Brother has  also noted that pt is short of breath preaching recently Will schedule pt for L/R heart cath  He will need to stop xarelto  prior  to the procedure Will get scheduled and let pt know the details  Will refer to general cardiology after results of cath are known  Butch Penny C. Saramarie Stinger, [facility] [address] [place], [place] [address] [phone]

## 2018-01-31 ENCOUNTER — Encounter (HOSPITAL_COMMUNITY): Payer: Self-pay | Admitting: *Deleted

## 2018-01-31 DIAGNOSIS — R9439 Abnormal result of other cardiovascular function study: Secondary | ICD-10-CM

## 2018-02-01 ENCOUNTER — Telehealth: Payer: Self-pay | Admitting: *Deleted

## 2018-02-01 NOTE — Telephone Encounter (Signed)
Left message to return a call to answer question he has about his HST.

## 2018-02-01 NOTE — Telephone Encounter (Signed)
Patient returned a call to me. He wanted to confirm the date, time and place he is to pick up his HST. Information provided to patient.

## 2018-02-03 ENCOUNTER — Ambulatory Visit (HOSPITAL_COMMUNITY)
Admission: RE | Admit: 2018-02-03 | Discharge: 2018-02-03 | Disposition: A | Discharge status: Home / Self Care | Payer: Medicare HMO | Source: Ambulatory Visit | Attending: Nurse Practitioner | Admitting: Nurse Practitioner

## 2018-02-03 DIAGNOSIS — Z01812 Encounter for preprocedural laboratory examination: Secondary | ICD-10-CM | POA: Insufficient documentation

## 2018-02-03 DIAGNOSIS — R9439 Abnormal result of other cardiovascular function study: Secondary | ICD-10-CM | POA: Diagnosis not present

## 2018-02-03 LAB — CBC
HCT: 42.6 % (ref 39.0–52.0)
Hemoglobin: 13.8 g/dL (ref 13.0–17.0)
MCH: 29.2 pg (ref 26.0–34.0)
MCHC: 32.4 g/dL (ref 30.0–36.0)
MCV: 90.3 fL (ref 78.0–100.0)
Platelets: 217 10*3/uL (ref 150–400)
RBC: 4.72 MIL/uL (ref 4.22–5.81)
RDW: 13.4 % (ref 11.5–15.5)
WBC: 5.1 10*3/uL (ref 4.0–10.5)

## 2018-02-03 LAB — BASIC METABOLIC PANEL
ANION GAP: 6 (ref 5–15)
BUN: 19 mg/dL (ref 6–20)
CO2: 28 mmol/L (ref 22–32)
CREATININE: 1.57 mg/dL — AB (ref 0.61–1.24)
Calcium: 9.2 mg/dL (ref 8.9–10.3)
Chloride: 105 mmol/L (ref 101–111)
GFR calc non Af Amer: 43 mL/min — ABNORMAL LOW (ref >60)
GFR, EST AFRICAN AMERICAN: 50 mL/min — AB (ref >60)
Glucose, Bld: 92 mg/dL (ref 65–99)
Potassium: 4.4 mmol/L (ref 3.5–5.1)
SODIUM: 139 mmol/L (ref 135–145)

## 2018-02-09 ENCOUNTER — Telehealth: Payer: Self-pay | Admitting: *Deleted

## 2018-02-09 NOTE — Telephone Encounter (Signed)
Pt contacted pre-catheterization scheduled at [facility] for: February 10, 2018 10:30 AM Verified arrival time and place: [place] Entrance A at: 6 AM-for pre-procedure hydration  No solid food after midnight prior to cath, clear liquids until 5 AM day of procedure. Verified allergies in Epic Verified no diabetes medications.  Hold: Xarelto--last dose was 02/08/18 around 6 PM hold until post procedure. Ultram-day before and day of procedure. HCTZ -day before and day of procedure. Olmesartan -day before and day of procedure.  Except hold medications AM meds can be  taken pre-cath with sip of water including: ASA 81 mg  Confirmed patient has responsible person to drive home post procedure and observe patient for 24 hours: yes

## 2018-02-10 ENCOUNTER — Ambulatory Visit (HOSPITAL_COMMUNITY)
Admission: RE | Admit: 2018-02-10 | Discharge: 2018-02-10 | Disposition: A | Discharge status: Home / Self Care | Payer: Medicare HMO | Source: Ambulatory Visit | Attending: Internal Medicine | Admitting: Internal Medicine

## 2018-02-10 ENCOUNTER — Ambulatory Visit (HOSPITAL_COMMUNITY)
Admission: RE | Disposition: A | Discharge status: Home / Self Care | Payer: Self-pay | Source: Ambulatory Visit | Attending: Internal Medicine

## 2018-02-10 ENCOUNTER — Encounter (HOSPITAL_COMMUNITY): Payer: Self-pay | Admitting: Internal Medicine

## 2018-02-10 DIAGNOSIS — R9439 Abnormal result of other cardiovascular function study: Secondary | ICD-10-CM | POA: Diagnosis present

## 2018-02-10 DIAGNOSIS — Z9889 Other specified postprocedural states: Secondary | ICD-10-CM | POA: Insufficient documentation

## 2018-02-10 DIAGNOSIS — M109 Gout, unspecified: Secondary | ICD-10-CM | POA: Diagnosis not present

## 2018-02-10 DIAGNOSIS — I10 Essential (primary) hypertension: Secondary | ICD-10-CM | POA: Insufficient documentation

## 2018-02-10 DIAGNOSIS — M1990 Unspecified osteoarthritis, unspecified site: Secondary | ICD-10-CM | POA: Diagnosis not present

## 2018-02-10 DIAGNOSIS — Z79899 Other long term (current) drug therapy: Secondary | ICD-10-CM | POA: Insufficient documentation

## 2018-02-10 DIAGNOSIS — I480 Paroxysmal atrial fibrillation: Secondary | ICD-10-CM | POA: Diagnosis present

## 2018-02-10 DIAGNOSIS — I429 Cardiomyopathy, unspecified: Secondary | ICD-10-CM

## 2018-02-10 DIAGNOSIS — Z8249 Family history of ischemic heart disease and other diseases of the circulatory system: Secondary | ICD-10-CM | POA: Insufficient documentation

## 2018-02-10 DIAGNOSIS — R011 Cardiac murmur, unspecified: Secondary | ICD-10-CM | POA: Insufficient documentation

## 2018-02-10 DIAGNOSIS — I4891 Unspecified atrial fibrillation: Secondary | ICD-10-CM | POA: Diagnosis present

## 2018-02-10 DIAGNOSIS — Z7901 Long term (current) use of anticoagulants: Secondary | ICD-10-CM | POA: Insufficient documentation

## 2018-02-10 DIAGNOSIS — I447 Left bundle-branch block, unspecified: Secondary | ICD-10-CM | POA: Diagnosis not present

## 2018-02-10 DIAGNOSIS — I2510 Atherosclerotic heart disease of native coronary artery without angina pectoris: Secondary | ICD-10-CM | POA: Insufficient documentation

## 2018-02-10 DIAGNOSIS — E669 Obesity, unspecified: Secondary | ICD-10-CM | POA: Insufficient documentation

## 2018-02-10 DIAGNOSIS — Z6833 Body mass index (BMI) 33.0-33.9, adult: Secondary | ICD-10-CM | POA: Diagnosis not present

## 2018-02-10 DIAGNOSIS — R0602 Shortness of breath: Secondary | ICD-10-CM | POA: Diagnosis not present

## 2018-02-10 HISTORY — PX: RIGHT/LEFT HEART CATH AND CORONARY ANGIOGRAPHY: CATH118266

## 2018-02-10 LAB — POCT I-STAT 3, VENOUS BLOOD GAS (G3P V)
ACID-BASE DEFICIT: 1 mmol/L (ref 0.0–2.0)
ACID-BASE DEFICIT: 1 mmol/L (ref 0.0–2.0)
BICARBONATE: 25.6 mmol/L (ref 20.0–28.0)
Bicarbonate: 24.8 mmol/L (ref 20.0–28.0)
O2 SAT: 69 %
O2 SAT: 70 %
PO2 VEN: 38 mmHg (ref 32.0–45.0)
TCO2: 26 mmol/L (ref 22–32)
TCO2: 27 mmol/L (ref 22–32)
pCO2, Ven: 45.1 mmHg (ref 44.0–60.0)
pCO2, Ven: 46.7 mmHg (ref 44.0–60.0)
pH, Ven: 7.347 (ref 7.250–7.430)
pH, Ven: 7.348 (ref 7.250–7.430)
pO2, Ven: 39 mmHg (ref 32.0–45.0)

## 2018-02-10 LAB — BASIC METABOLIC PANEL
ANION GAP: 6 (ref 5–15)
BUN: 15 mg/dL (ref 6–20)
CHLORIDE: 105 mmol/L (ref 101–111)
CO2: 27 mmol/L (ref 22–32)
CREATININE: 1.47 mg/dL — AB (ref 0.61–1.24)
Calcium: 8.8 mg/dL — ABNORMAL LOW (ref 8.9–10.3)
GFR calc non Af Amer: 47 mL/min — ABNORMAL LOW (ref >60)
GFR, EST AFRICAN AMERICAN: 54 mL/min — AB (ref >60)
Glucose, Bld: 93 mg/dL (ref 65–99)
POTASSIUM: 3.9 mmol/L (ref 3.5–5.1)
Sodium: 138 mmol/L (ref 135–145)

## 2018-02-10 LAB — POCT I-STAT 3, ART BLOOD GAS (G3+)
Acid-base deficit: 1 mmol/L (ref 0.0–2.0)
Bicarbonate: 23.8 mmol/L (ref 20.0–28.0)
O2 SAT: 97 %
PH ART: 7.387 (ref 7.350–7.450)
TCO2: 25 mmol/L (ref 22–32)
pCO2 arterial: 39.6 mmHg (ref 32.0–48.0)
pO2, Arterial: 87 mmHg (ref 83.0–108.0)

## 2018-02-10 SURGERY — RIGHT/LEFT HEART CATH AND CORONARY ANGIOGRAPHY
Anesthesia: LOCAL

## 2018-02-10 MED ORDER — FENTANYL CITRATE (PF) 100 MCG/2ML IJ SOLN
INTRAMUSCULAR | Status: AC
Start: 2018-02-10 — End: ?
  Filled 2018-02-10: qty 2

## 2018-02-10 MED ORDER — ACETAMINOPHEN 325 MG PO TABS: 650.0000 mg | ORAL_TABLET | ORAL | Status: DC | PRN

## 2018-02-10 MED ORDER — SODIUM CHLORIDE 0.9 % IV SOLN
INTRAVENOUS | Status: AC | PRN
  Administered 2018-02-10: 250 mL/h via INTRAVENOUS

## 2018-02-10 MED ORDER — SODIUM CHLORIDE 0.9 % WEIGHT BASED INFUSION: 1.0000 mL/kg/h | INTRAVENOUS | Status: DC

## 2018-02-10 MED ORDER — IOHEXOL 350 MG/ML SOLN
INTRAVENOUS | Status: DC | PRN
  Administered 2018-02-10: 30 mL via INTRA_ARTERIAL

## 2018-02-10 MED ORDER — SODIUM CHLORIDE 0.9 % IV SOLN: 250.0000 mL | INTRAVENOUS | Status: DC | PRN

## 2018-02-10 MED ORDER — HEPARIN (PORCINE) IN NACL 2-0.9 UNITS/ML
INTRAMUSCULAR | Status: AC | PRN
  Administered 2018-02-10: 1000 mL via INTRA_ARTERIAL

## 2018-02-10 MED ORDER — FENTANYL CITRATE (PF) 100 MCG/2ML IJ SOLN
INTRAMUSCULAR | Status: DC | PRN
  Administered 2018-02-10: 25 ug via INTRAVENOUS

## 2018-02-10 MED ORDER — HEPARIN SODIUM (PORCINE) 1000 UNIT/ML IJ SOLN
INTRAMUSCULAR | Status: AC
  Filled 2018-02-10: qty 1

## 2018-02-10 MED ORDER — MIDAZOLAM HCL 2 MG/2ML IJ SOLN
INTRAMUSCULAR | Status: AC
  Filled 2018-02-10: qty 2

## 2018-02-10 MED ORDER — HYDRALAZINE HCL 20 MG/ML IJ SOLN
INTRAMUSCULAR | Status: DC | PRN
  Administered 2018-02-10: 5 mg via INTRAVENOUS

## 2018-02-10 MED ORDER — SODIUM CHLORIDE 0.9 % WEIGHT BASED INFUSION
3.0000 mL/kg/h | INTRAVENOUS | Status: DC
  Administered 2018-02-10: 3 mL/kg/h via INTRAVENOUS

## 2018-02-10 MED ORDER — HYDRALAZINE HCL 20 MG/ML IJ SOLN
INTRAMUSCULAR | Status: AC
  Filled 2018-02-10: qty 1

## 2018-02-10 MED ORDER — SODIUM CHLORIDE 0.9% FLUSH: 3.0000 mL | INTRAVENOUS | Status: DC | PRN

## 2018-02-10 MED ORDER — MIDAZOLAM HCL 2 MG/2ML IJ SOLN
INTRAMUSCULAR | Status: DC | PRN
  Administered 2018-02-10: 1 mg via INTRAVENOUS

## 2018-02-10 MED ORDER — SODIUM CHLORIDE 0.9% FLUSH: 3.0000 mL | Freq: Two times a day (BID) | INTRAVENOUS | Status: DC

## 2018-02-10 MED ORDER — VERAPAMIL HCL 2.5 MG/ML IV SOLN
INTRAVENOUS | Status: DC | PRN
  Administered 2018-02-10: 11:00:00 via INTRA_ARTERIAL

## 2018-02-10 MED ORDER — HEPARIN SODIUM (PORCINE) 1000 UNIT/ML IJ SOLN
INTRAMUSCULAR | Status: DC | PRN
  Administered 2018-02-10: 5000 [IU] via INTRAVENOUS

## 2018-02-10 MED ORDER — VERAPAMIL HCL 2.5 MG/ML IV SOLN
INTRAVENOUS | Status: AC
  Filled 2018-02-10: qty 2

## 2018-02-10 MED ORDER — ONDANSETRON HCL 4 MG/2ML IJ SOLN: 4.0000 mg | Freq: Four times a day (QID) | INTRAMUSCULAR | Status: DC | PRN

## 2018-02-10 MED ORDER — LIDOCAINE HCL (PF) 1 % IJ SOLN
INTRAMUSCULAR | Status: DC | PRN
  Administered 2018-02-10 (×2): 2 mL

## 2018-02-10 MED ORDER — LIDOCAINE HCL (PF) 1 % IJ SOLN
INTRAMUSCULAR | Status: AC
  Filled 2018-02-10: qty 30

## 2018-02-10 MED ORDER — SODIUM CHLORIDE 0.9 % IV SOLN: INTRAVENOUS | Status: DC

## 2018-02-10 MED ORDER — ASPIRIN 81 MG PO CHEW: 81.0000 mg | CHEWABLE_TABLET | ORAL | Status: DC

## 2018-02-10 MED ORDER — HEPARIN (PORCINE) IN NACL 1000-0.9 UT/500ML-% IV SOLN
INTRAVENOUS | Status: AC
  Filled 2018-02-10: qty 1000

## 2018-02-10 MED ORDER — RIVAROXABAN 20 MG PO TABS: 20.0000 mg | ORAL_TABLET | Freq: Every day | ORAL | 3 refills | Status: DC

## 2018-02-10 SURGICAL SUPPLY — 12 items
CATH 5FR JL3.5 JR4 ANG PIG MP (CATHETERS) ×1 IMPLANT
CATH BALLN WEDGE 5F 110CM (CATHETERS) ×1 IMPLANT
DEVICE RAD COMP TR BAND LRG (VASCULAR PRODUCTS) ×1 IMPLANT
ELECT DEFIB PAD ADLT CADENCE (PAD) ×1 IMPLANT
GLIDESHEATH SLEND SS 6F .021 (SHEATH) ×1 IMPLANT
GUIDEWIRE INQWIRE 1.5J.035X260 (WIRE) IMPLANT
INQWIRE 1.5J .035X260CM (WIRE) ×2
KIT HEART LEFT (KITS) ×2 IMPLANT
PACK CARDIAC CATHETERIZATION (CUSTOM PROCEDURE TRAY) ×2 IMPLANT
SHEATH RAIN 4/5FR (SHEATH) ×1 IMPLANT
TRANSDUCER W/STOPCOCK (MISCELLANEOUS) ×2 IMPLANT
TUBING CIL FLEX 10 FLL-RA (TUBING) ×2 IMPLANT

## 2018-02-10 NOTE — Brief Op Note (Signed)
BRIEF CARDIAC CATHETERIZATION NOTE  DATE: 02/10/2018 TIME: 11:26 AM  PATIENT:  [name] [name]  72 y.o. male  PRE-OPERATIVE DIAGNOSIS:  Shortness of breath, cardiomyopathy, abnormal stress test, and paroxysmal atrial fibrillation  POST-OPERATIVE DIAGNOSIS:  Non-obstructive CAD.  PROCEDURE:  Procedure(s): RIGHT/LEFT HEART CATH AND CORONARY ANGIOGRAPHY (N/A)  SURGEON:  Surgeon(s) and Role:    * Jamaurie Bernier, MD - Primary  FINDINGS: 1. Mild to moderate, non-obstructive CAD. 2. Normal left and right heart filling pressures. 3. Normal Fick cardiac output/index.  RECOMMENDATIONS: 1. Continue medical therapy of NICM cardiomyopathy.  Escalation of blood pressure regimen recommended if blood pressure remains elevated. 2. If not evidence of bleeding or vascular injury, rivaroxaban can be restarted tomorrow evening.  Nelva Bush, MD [facility] Pager: [phone]

## 2018-02-10 NOTE — Discharge Instructions (Signed)

## 2018-02-10 NOTE — Interval H&P Note (Signed)
History and Physical Interval Note:  02/10/2018 7:22 AM  Carlis Abbott Riendeau  has presented today for cardiac catheterization, with the diagnosis of shortness of breath, cardiomyopathy, and abnormal stress test  The various methods of treatment have been discussed with the patient and family. After consideration of risks, benefits and other options for treatment, the patient has consented to  Procedure(s): RIGHT/LEFT HEART CATH AND CORONARY ANGIOGRAPHY (N/A) as a surgical intervention .  The patient's history has been reviewed, patient examined, no change in status, stable for surgery.  I have reviewed the patient's chart and labs.  Questions were answered to the patient's satisfaction.    Cath Lab Visit (complete for each Cath Lab visit)  Clinical Evaluation Leading to the Procedure:   ACS: No.  Non-ACS:    Anginal/Heart Failure Classification: NYHA class II  Anti-ischemic medical therapy: Maximal Therapy (2 or more classes of medications)  Non-Invasive Test Results: Intermediate-risk stress test findings: cardiac mortality 1-3%/year  Prior CABG: No previous CABG  Marnell Mcdaniel

## 2018-02-13 MED FILL — Heparin Sod (Porcine)-NaCl IV Soln 1000 Unit/500ML-0.9%: INTRAVENOUS | Qty: 1000 | Status: AC

## 2018-02-15 ENCOUNTER — Ambulatory Visit (HOSPITAL_BASED_OUTPATIENT_CLINIC_OR_DEPARTMENT_OTHER): Payer: Medicare HMO | Attending: Cardiovascular Disease | Admitting: Cardiovascular Disease

## 2018-02-15 DIAGNOSIS — R400 Somnolence: Secondary | ICD-10-CM | POA: Insufficient documentation

## 2018-02-15 DIAGNOSIS — I4891 Unspecified atrial fibrillation: Secondary | ICD-10-CM | POA: Diagnosis not present

## 2018-02-15 DIAGNOSIS — G4733 Obstructive sleep apnea (adult) (pediatric): Secondary | ICD-10-CM

## 2018-02-15 DIAGNOSIS — Z7901 Long term (current) use of anticoagulants: Secondary | ICD-10-CM | POA: Insufficient documentation

## 2018-02-15 DIAGNOSIS — Z8673 Personal history of transient ischemic attack (TIA), and cerebral infarction without residual deficits: Secondary | ICD-10-CM | POA: Insufficient documentation

## 2018-02-15 DIAGNOSIS — R0683 Snoring: Secondary | ICD-10-CM | POA: Diagnosis not present

## 2018-02-15 DIAGNOSIS — Z79899 Other long term (current) drug therapy: Secondary | ICD-10-CM | POA: Insufficient documentation

## 2018-02-15 DIAGNOSIS — R5383 Other fatigue: Secondary | ICD-10-CM | POA: Insufficient documentation

## 2018-02-15 DIAGNOSIS — G4736 Sleep related hypoventilation in conditions classified elsewhere: Secondary | ICD-10-CM | POA: Insufficient documentation

## 2018-02-15 DIAGNOSIS — I10 Essential (primary) hypertension: Secondary | ICD-10-CM | POA: Diagnosis not present

## 2018-02-25 ENCOUNTER — Encounter (HOSPITAL_BASED_OUTPATIENT_CLINIC_OR_DEPARTMENT_OTHER): Payer: Self-pay | Admitting: Cardiovascular Disease

## 2018-02-25 NOTE — Procedures (Signed)
Patient Name: [name] [name], [name] [name] Date: 02/16/2018 Gender: Male D.O.B: 1946/04/17 Age (years): 40 Referring Provider: Shelva Majestic MD, ABSM Height (inches): 70 Interpreting Physician: Shelva Majestic MD, ABSM Weight (lbs): 245 RPSGT: Jonna Coup BMI: 35 MRN: [id number] Neck Size: 18.00  CLINICAL INFORMATION Sleep Study Type: HST  Indication for sleep study: Fatigue  Epworth Sleepiness Score: 3  SLEEP STUDY TECHNIQUE A multi-channel overnight portable sleep study was performed. The channels recorded were: nasal airflow, thoracic respiratory movement, and oxygen saturation with a pulse oximetry. Snoring was also monitored.  MEDICATIONS     allopurinol (ZYLOPRIM) 300 MG tablet             amLODipine (NORVASC) 10 MG tablet         colchicine 0.6 MG tablet         Colesevelam HCl (WELCHOL) 3.75 g PACK         diphenhydrAMINE (BENADRYL) 25 mg capsule         docusate sodium (COLACE) 100 MG capsule         dutasteride (AVODART) 0.5 MG capsule         ezetimibe (ZETIA) 10 MG tablet         gabapentin (NEURONTIN) 300 MG capsule         hydrochlorothiazide (HYDRODIURIL) 12.5 MG tablet         lubiprostone (AMITIZA) 24 MCG capsule         methocarbamol (ROBAXIN) 500 MG tablet         metoprolol succinate (TOPROL-XL) 100 MG 24 hr tablet         Multiple Vitamin (MULTIVITAMIN WITH MINERALS) TABS tablet         olmesartan (BENICAR) 40 MG tablet         polyethylene glycol powder (MIRALAX) powder         promethazine (PHENERGAN) 25 MG tablet         rivaroxaban (XARELTO) 20 MG TABS tablet         tamsulosin (FLOMAX) 0.4 MG CAPS capsule         traMADol (ULTRAM) 50 MG tablet      Patient self administered medications include: N/A.  SLEEP ARCHITECTURE Patient was studied for 374 minutes. The sleep efficiency was 96.2 % and the patient was supine for 89.8%. The arousal index was 0.0 per hour.  RESPIRATORY PARAMETERS The overall AHI was 21.7 per hour, with a central  apnea index of 0.0 per hour.  The oxygen nadir was 78% during sleep.  CARDIAC DATA Mean heart rate during sleep was 46.5 bpm. The lowest heart rate scored was 39 bpm.  IMPRESSIONS - Moderate obstructive sleep apnea occurred during this study (AHI 21.7/h).  The severity of the sleep apnea during REM sleep cannot be assessed on this home study - No significant central sleep apnea occurred during this study (CAI = 0.0/h). - Severe oxygen desaturation to a nadir of 78%. - Patient snored 22.6% during the sleep. The snoring index was 39.47/hr of sleep.  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 - Avoid alcohol, sedatives and other CNS depressants that may worsen sleep apnea and disrupt normal sleep architecture. - Sleep hygiene should be reviewed to assess factors that may improve sleep quality. - Weight  management and regular exercise should be initiated or continued. - Recommend a download and sleep clinic evaluation after initiation of CPAP therapy.   [Electronically signed] 02/25/2018 03:14 PM  Shelva Majestic MD, [facility] [place], ABSM Diplomate, American Board of Sleep Medicine   NPI: [id number] [place] PH: [phone]   FX: [phone] [place]

## 2018-02-27 ENCOUNTER — Telehealth: Payer: Self-pay | Admitting: *Deleted

## 2018-02-27 ENCOUNTER — Other Ambulatory Visit: Payer: Self-pay | Admitting: Cardiovascular Disease

## 2018-02-27 DIAGNOSIS — I480 Paroxysmal atrial fibrillation: Secondary | ICD-10-CM

## 2018-02-27 DIAGNOSIS — G4733 Obstructive sleep apnea (adult) (pediatric): Secondary | ICD-10-CM

## 2018-02-27 NOTE — Telephone Encounter (Signed)
Patient notified of HST results and recommendations. 

## 2018-02-27 NOTE — Telephone Encounter (Signed)
-----   Message from Troy Sine, MD sent at 02/25/2018  3:19 PM EDT ----- Mariann Laster, Please notify the patient of moderate sleep apnea, doing rems sleep with significant oxygen desaturation to a nadir of 78%.  Recommend an in lab CPAP titration study.

## 2018-04-05 ENCOUNTER — Ambulatory Visit: Payer: Medicare HMO | Admitting: Cardiology

## 2018-04-12 DIAGNOSIS — R69 Illness, unspecified: Secondary | ICD-10-CM | POA: Diagnosis not present

## 2018-04-12 IMAGING — MR MR LUMBAR SPINE W/O CM
5 series · 44 of 48 positions shown · non-contrast
Comparison: Lumbar MRI 11/17/2014

CLINICAL DATA: Low back pain without sciatica. Left leg pain. No
prior back surgery.

EXAM:
MRI LUMBAR SPINE WITHOUT CONTRAST
TECHNIQUE: Multiplanar, multisequence MR imaging of the lumbar spine was
performed. No intravenous contrast was administered.

[Series 3: tirm sag · sagittal · 4.0mm · 0.55mm/px · 6 of 13 slices shown]
[im 1/13]
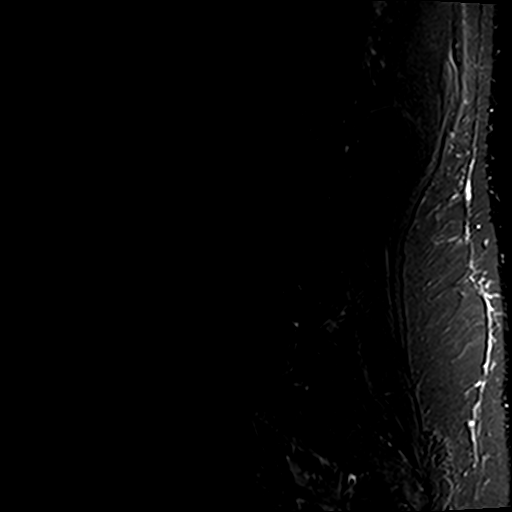
[im 3/13]
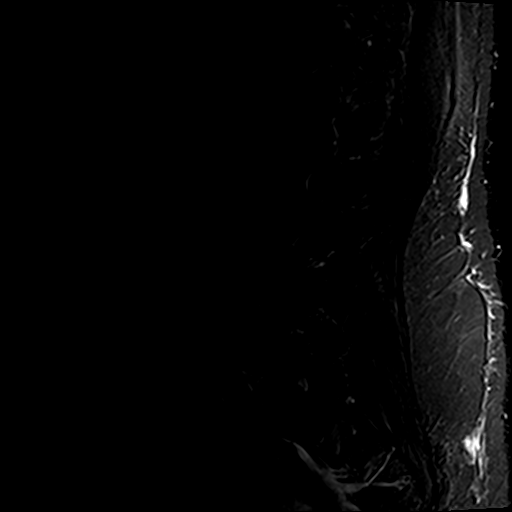
[im 5/13]
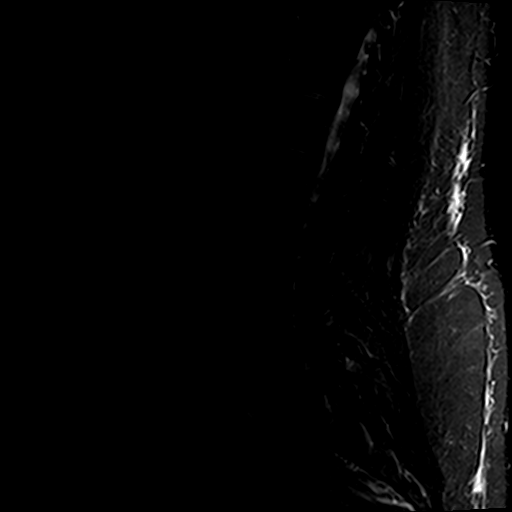
[im 8/13]
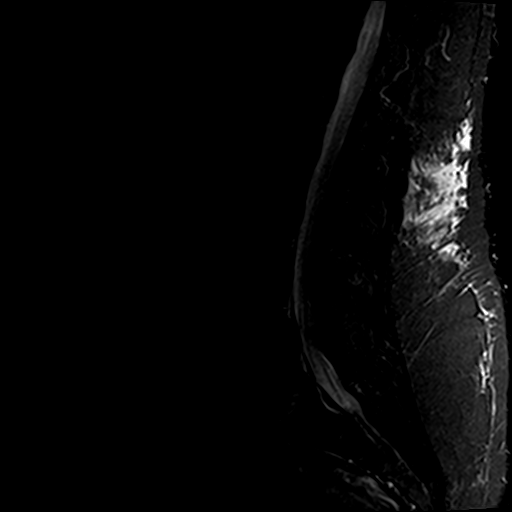
[im 10/13]
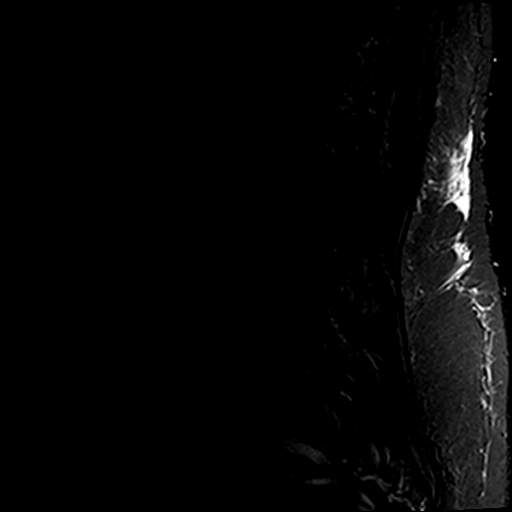
[im 13/13]
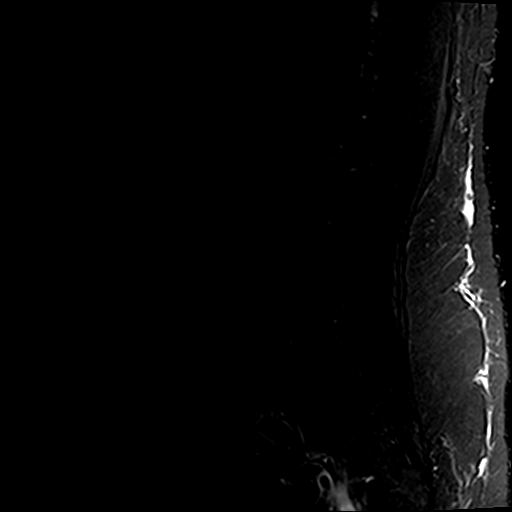

[Series 4: T2 · sagittal · 4.0mm · 0.88mm/px · 6 of 13 slices shown (1 of 2)]
[im 1/13]
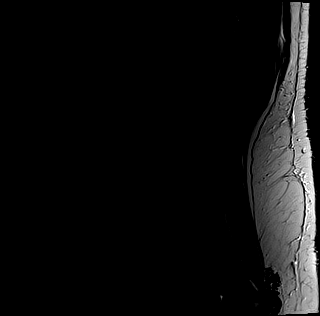
[im 3/13]
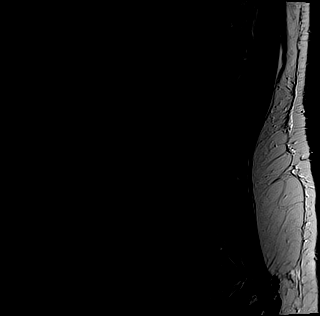
[im 5/13]
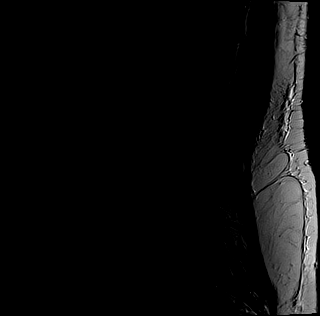
[im 8/13]
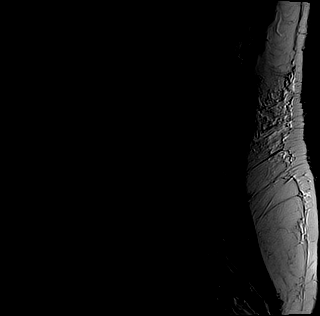
[im 10/13]
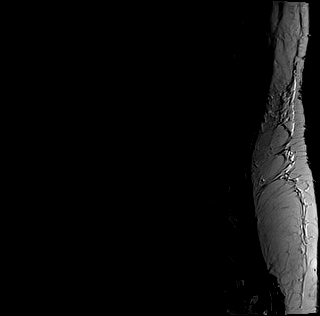
[im 13/13]
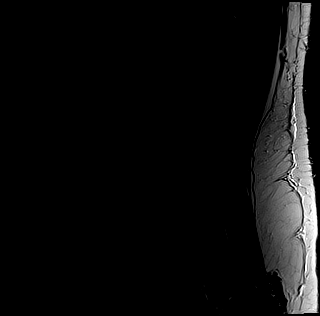

[Series 5: T1 · sagittal · 4.0mm · 0.88mm/px · 6 of 13 slices shown (1 of 2)]
[im 1/13]
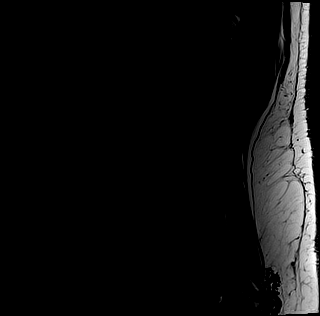
[im 3/13]
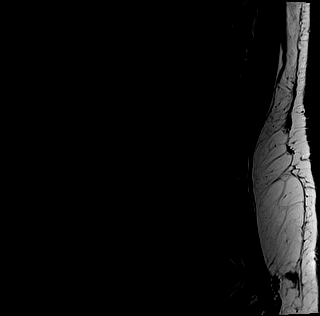
[im 5/13]
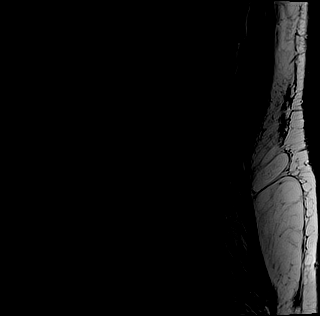
[im 8/13]
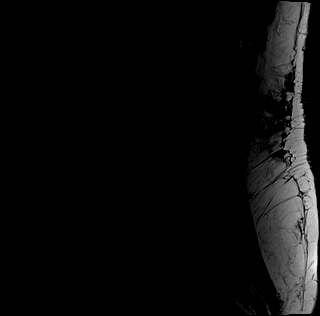
[im 10/13]
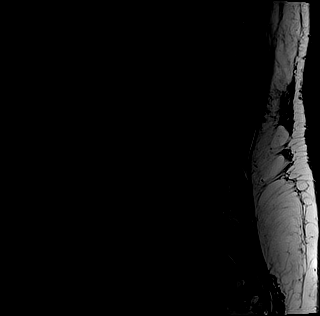
[im 13/13]
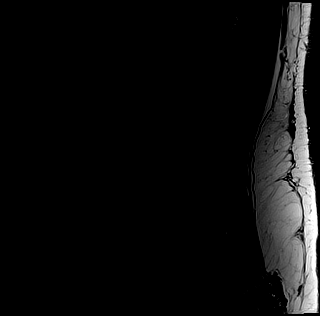

[Series 6: T1 · axial · 4.0mm · 0.70mm/px · z∈[+0,+185]mm · 11 of 32 slices shown (2 of 2)]
[im 1/32]
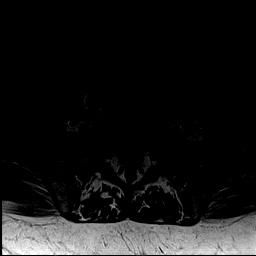
[im 3/32]
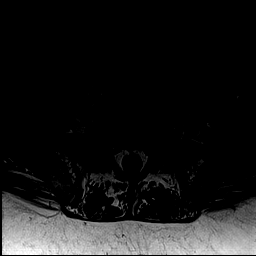
[im 5/32]
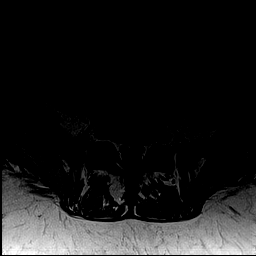
[im 7/32]
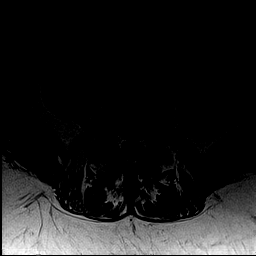
[im 9/32]
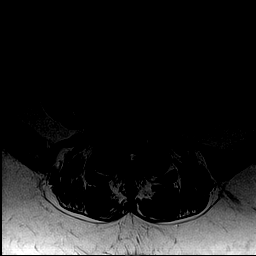
[im 14/32]
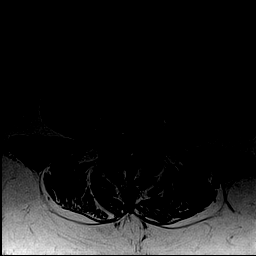
[im 16/32]
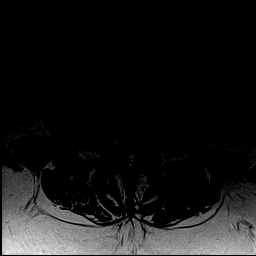
[im 18/32]
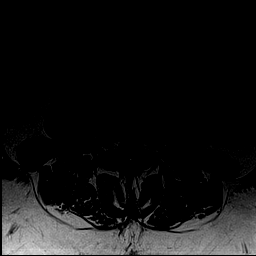
[im 23/32]
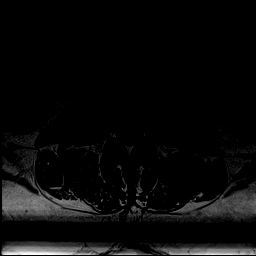
[im 27/32]
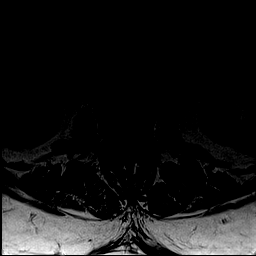
[im 32/32]
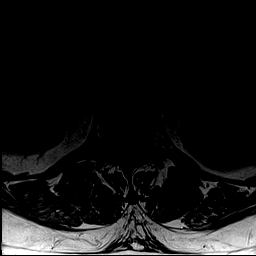

[Series 7: T2 · axial · 4.0mm · 0.70mm/px · z∈[+0,+185]mm · 15 of 32 slices shown (2 of 2)]
[im 1/32]
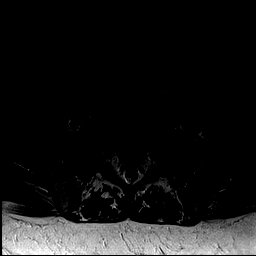
[im 3/32]
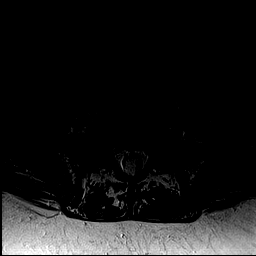
[im 5/32]
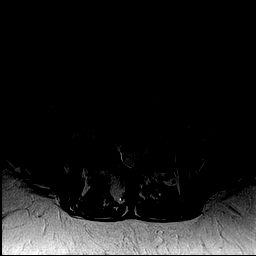
[im 7/32]
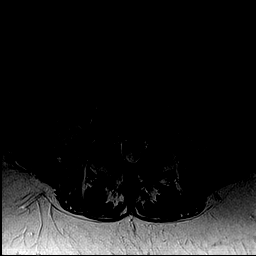
[im 9/32]
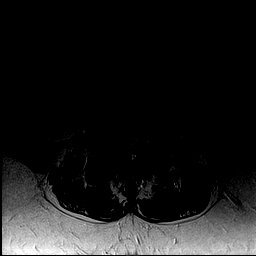
[im 12/32]
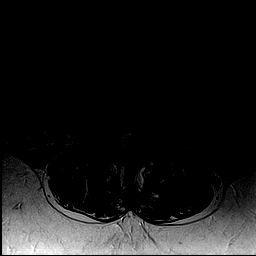
[im 14/32]
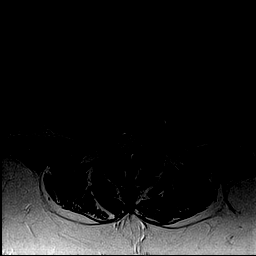
[im 16/32]
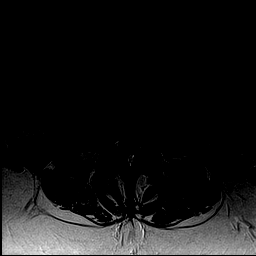
[im 18/32]
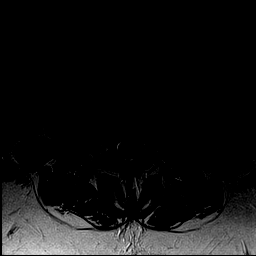
[im 20/32]
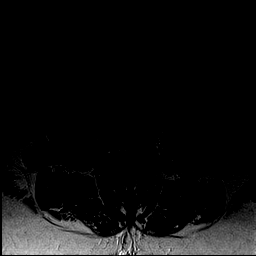
[im 23/32]
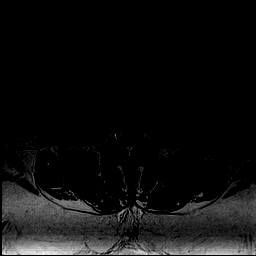
[im 25/32]
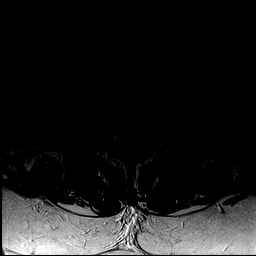
[im 27/32]
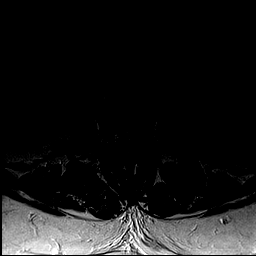
[im 29/32]
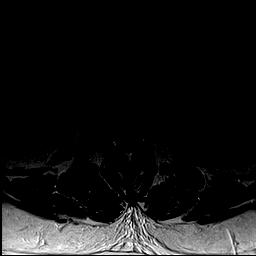
[im 32/32]
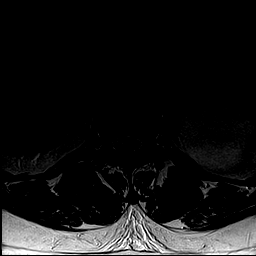

[44 of 48 positions shown; findings below may reference images not displayed]

FINDINGS: Segmentation:  Normal segmentation.  Lowest disc space L5-S1

Alignment: Slight anterolisthesis L4-5 similar to the prior study.
Remaining alignment normal.

Vertebrae: Negative for fracture or mass lesion. Normal bone
mineral.

Conus medullaris: Extends to the T12-L1 level and appears normal.

Paraspinal and other soft tissues: Paraspinous soft tissues and
muscles normal. Large cyst in the left kidney, incompletely
evaluated but previously present. Smaller left lower pole cyst
unchanged. No adenopathy.

Disc levels:

L1-2:  Negative

L2-3: Mild facet degeneration. No significant disc degeneration or
stenosis

L3-4: Mild disc bulging and mild facet degeneration. No significant
spinal stenosis

L4-5: Mild anterior listhesis. Mild disc bulging and moderate to
advanced facet hypertrophy bilaterally. Moderate spinal stenosis
with mild progression. Mild foraminal narrowing on the left
unchanged.

L5-S1:  Negative
IMPRESSION: Moderate spinal stenosis L4-5 shows mild progression. There is
extensive facet degeneration with mild anterolisthesis of L4 on L5.
No other changes since 11/17/2014.

## 2018-04-18 ENCOUNTER — Encounter: Payer: Self-pay | Admitting: Cardiology

## 2018-04-18 ENCOUNTER — Ambulatory Visit (INDEPENDENT_AMBULATORY_CARE_PROVIDER_SITE_OTHER): Payer: Medicare HMO | Admitting: Cardiology

## 2018-04-18 VITALS — BP 130/76 | HR 80 | Ht 70.0 in | Wt 249.1 lb

## 2018-04-18 DIAGNOSIS — G4733 Obstructive sleep apnea (adult) (pediatric): Secondary | ICD-10-CM

## 2018-04-18 DIAGNOSIS — I420 Dilated cardiomyopathy: Secondary | ICD-10-CM | POA: Diagnosis not present

## 2018-04-18 DIAGNOSIS — I480 Paroxysmal atrial fibrillation: Secondary | ICD-10-CM | POA: Diagnosis not present

## 2018-04-18 DIAGNOSIS — I447 Left bundle-branch block, unspecified: Secondary | ICD-10-CM | POA: Diagnosis not present

## 2018-04-18 NOTE — Patient Instructions (Signed)
Medication Instructions:  The current medical regimen is effective;  continue present plan and medications.  Follow-Up: Follow up in 6 months with Laura Ingold, NP.  You will receive a letter in the mail 2 months before you are due.  Please call us when you receive this letter to schedule your follow up appointment.  Follow up in 1 year with Dr. Skains.  You will receive a letter in the mail 2 months before you are due.  Please call us when you receive this letter to schedule your follow up appointment.  If you need a refill on your cardiac medications before your next appointment, please call your pharmacy.  Thank you for choosing [facility]!!     

## 2018-04-18 NOTE — Progress Notes (Signed)
Cardiology Office Note:    Date:  04/18/2018   ID:  [name] [name], DOB 1946/08/01, MRN [id number]  PCP:  Maury Dus, MD  Cardiologist:  No primary care provider on file.   Referring MD: Maury Dus, MD     History of Present Illness:    [name] [name] is a 72 y.o. male with nonischemic cardiomyopathy, mild to moderate nonobstructive CAD on heart catheterization 02/10/2018 here to establish care as a new patient.  He was seen in atrial fibrillation clinic with heart rate of 107 bpm, subsequent EKG showed sinus rhythm with left bundle branch block.  Chads vas score is 4, age hypertension remote TIAs x2.  Wife has Alzheimer's problems at night.  Minister.  Had nuclear stress test which showed EF of 41%, peri-infarct ischemia  Had heart catheterization which showed mild nonobstructive CAD.  Overall he has been feeling quite well.  No orthopnea PND shortness of breath bleeding chest pain.  Blood pressure is been under good control.  Every now and then he may have some mild heartburn when he takes his Xarelto after supper.  He takes a tiny amount of baking soda and this seems to help.    Past Medical History:  Diagnosis Date  . Arthritis   . Gout   . Heart murmur   . Hypertension     Past Surgical History:  Procedure Laterality Date  . HERNIA REPAIR    . RIGHT/LEFT HEART CATH AND CORONARY ANGIOGRAPHY N/A 02/10/2018   Procedure: RIGHT/LEFT HEART CATH AND CORONARY ANGIOGRAPHY;  Surgeon: Nelva Bush, MD;  Location: [facility];  Service: Cardiovascular;  Laterality: N/A;    Current Medications: Current Meds  Medication Sig  . allopurinol (ZYLOPRIM) 300 MG tablet Take 300 mg by mouth daily.   Marland Kitchen amLODipine (NORVASC) 10 MG tablet Take 10 mg by mouth daily.  . colchicine 0.6 MG tablet Take 0.6 mg by mouth daily as needed (for gout).   . Colesevelam HCl (WELCHOL) 3.75 g PACK Take 3.75 g by mouth 3 (three) times a week.   . diphenhydrAMINE (BENADRYL) 25 mg  capsule Take 25 mg by mouth every 6 (six) hours as needed for itching.  . docusate sodium (COLACE) 100 MG capsule Take 1 capsule (100 mg total) by mouth every 12 (twelve) hours.  Marland Kitchen ezetimibe (ZETIA) 10 MG tablet Take 10 mg by mouth daily.  Marland Kitchen gabapentin (NEURONTIN) 300 MG capsule Take 300 mg by mouth 3 (three) times daily as needed.  . hydrochlorothiazide (HYDRODIURIL) 12.5 MG tablet Take 12.5 mg by mouth daily.  Marland Kitchen lubiprostone (AMITIZA) 24 MCG capsule Take 24 mcg by mouth daily as needed for constipation.  . methocarbamol (ROBAXIN) 500 MG tablet Take 1 tablet (500 mg total) by mouth 3 (three) times daily between meals as needed.  . metoprolol succinate (TOPROL-XL) 100 MG 24 hr tablet Take 100 mg by mouth daily.  . Multiple Vitamin (MULTIVITAMIN WITH MINERALS) TABS tablet Take 1 tablet by mouth 2 (two) times a week.   . olmesartan (BENICAR) 40 MG tablet Take 40 mg by mouth daily.  . promethazine (PHENERGAN) 25 MG tablet Take 25 mg by mouth every 6 (six) hours as needed for nausea or vomiting.  . rivaroxaban (XARELTO) 20 MG TABS tablet Take 1 tablet (20 mg total) by mouth daily with supper.  . tamsulosin (FLOMAX) 0.4 MG CAPS capsule Take 0.4 mg by mouth daily.  . traMADol (ULTRAM) 50 MG tablet Take 50-100 mg by mouth every 6 (six) hours  as needed for moderate pain.      Allergies:   Patient has no known allergies.   Social History   Socioeconomic History  . Marital status: Single    Spouse name: Not on file  . Number of children: Not on file  . Years of education: Not on file  . Highest education level: Not on file  Occupational History  . Not on file  Social Needs  . Financial resource strain: Not on file  . Food insecurity:    Worry: Not on file    Inability: Not on file  . Transportation needs:    Medical: Not on file    Non-medical: Not on file  Tobacco Use  . Smoking status: Never Smoker  . Smokeless tobacco: Never Used  Substance and Sexual Activity  . Alcohol use: No  .  Drug use: No  . Sexual activity: Not on file  Lifestyle  . Physical activity:    Days per week: Not on file    Minutes per session: Not on file  . Stress: Not on file  Relationships  . Social connections:    Talks on phone: Not on file    Gets together: Not on file    Attends religious service: Not on file    Active member of club or organization: Not on file    Attends meetings of clubs or organizations: Not on file    Relationship status: Not on file  Other Topics Concern  . Not on file  Social History Narrative  . Not on file     Family History: The patient's family history includes Cancer in his mother; Heart disease in his father; Hypertension in his brother, father, mother, and sister.  ROS:   Please see the history of present illness.     All other systems reviewed and are negative.  EKGs/Labs/Other Studies Reviewed:    The following studies were reviewed today: Prior office notes, lab work, EKG, echocardiogram, catheterization reviewed  EKG: Left bundle branch block, atrial fibrillation then sinus rhythm  Recent Labs: 02/03/2018: Hemoglobin 13.8; Platelets 217 02/10/2018: BUN 15; Creatinine, Ser 1.47; Potassium 3.9; Sodium 138  Recent Lipid Panel No results found for: CHOL, TRIG, HDL, CHOLHDL, VLDL, LDLCALC, LDLDIRECT  Physical Exam:    VS:  BP 130/76   Pulse 80   Ht 5\' 10"  (1.778 m)   Wt 249 lb 1.9 oz (113 kg)   SpO2 97%   BMI 35.74 kg/m     Wt Readings from Last 3 Encounters:  04/18/18 249 lb 1.9 oz (113 kg)  02/10/18 236 lb (107 kg)  01/27/18 251 lb (113.9 kg)     GEN: Overweight well nourished, well developed in no acute distress HEENT: Normal NECK: No JVD; No carotid bruits LYMPHATICS: No lymphadenopathy CARDIAC: RRR, no murmurs, rubs, gallops RESPIRATORY:  Clear to auscultation without rales, wheezing or rhonchi  ABDOMEN: Soft, non-tender, non-distended MUSCULOSKELETAL:  No edema; No deformity  SKIN: Warm and dry NEUROLOGIC:  Alert and  oriented x 3 PSYCHIATRIC:  Normal affect   ASSESSMENT:    1. Paroxysmal atrial fibrillation (HCC)   2. Dilated cardiomyopathy ([place])   3. LBBB (left bundle branch block)   4. Obstructive sleep apnea (adult) (pediatric)   5. Morbid obesity ([place])    PLAN:    In order of problems listed above:  Dilated cardiomyopathy -Nonischemic, likely hypertensive. -Medications reviewed as above he is on goal-directed therapy.  NYHA class I.  Doing well.  No changes.  EF 45 to 50% on echocardiogram, mildly reduced.  Paroxysmal atrial fibrillation -Continue with Xarelto.  Chads vas score greater than 2.  No signs of bleeding.  Monitor his CBC and basic metabolic profile every 6 months. Dr. Alyson Ingles to see him soon.  Hyperlipidemia - Currently on Zetia.  Essential hypertension/hypertensive heart disease -Medications multidrug regimen reviewed which includes beta-blocker and angiotensin receptor blocker.  Obstructive sleep apnea -Encourage CPAP use.  Chronic kidney disease stage III -Avoid NSAIDs.  He states that he knows only to take Tylenol.  Morbid obesity -BMI greater than 35 with 2 or more comorbidities.  Continue to encourage weight loss.   Medication Adjustments/Labs and Tests Ordered: Current medicines are reviewed at length with the patient today.  Concerns regarding medicines are outlined above.  No orders of the defined types were placed in this encounter.  No orders of the defined types were placed in this encounter.   Patient Instructions  Medication Instructions:  The current medical regimen is effective;  continue present plan and medications.  Follow-Up: Follow up in 6 months with Cecilie Kicks, NP.  You will receive a letter in the mail 2 months before you are due.  Please call us when you receive this letter to schedule your follow up appointment.  Follow up in 1 year with Dr. Marlou Porch.  You will receive a letter in the mail 2 months before you are due.  Please call us when  you receive this letter to schedule your follow up appointment.  If you need a refill on your cardiac medications before your next appointment, please call your pharmacy.  Thank you for choosing [facility]!!        Signed, Candee Furbish, MD  04/18/2018 2:55 PM    [place]

## 2018-06-05 DIAGNOSIS — I129 Hypertensive chronic kidney disease with stage 1 through stage 4 chronic kidney disease, or unspecified chronic kidney disease: Secondary | ICD-10-CM | POA: Diagnosis not present

## 2018-06-05 DIAGNOSIS — Z0000 Encounter for general adult medical examination without abnormal findings: Secondary | ICD-10-CM | POA: Diagnosis not present

## 2018-06-05 DIAGNOSIS — M109 Gout, unspecified: Secondary | ICD-10-CM | POA: Diagnosis not present

## 2018-06-05 DIAGNOSIS — E7800 Pure hypercholesterolemia, unspecified: Secondary | ICD-10-CM | POA: Diagnosis not present

## 2018-06-05 DIAGNOSIS — Z125 Encounter for screening for malignant neoplasm of prostate: Secondary | ICD-10-CM | POA: Diagnosis not present

## 2018-06-05 DIAGNOSIS — Z23 Encounter for immunization: Secondary | ICD-10-CM | POA: Diagnosis not present

## 2018-06-05 DIAGNOSIS — Z1389 Encounter for screening for other disorder: Secondary | ICD-10-CM | POA: Diagnosis not present

## 2018-06-05 DIAGNOSIS — Z1159 Encounter for screening for other viral diseases: Secondary | ICD-10-CM | POA: Diagnosis not present

## 2018-06-05 DIAGNOSIS — I480 Paroxysmal atrial fibrillation: Secondary | ICD-10-CM | POA: Diagnosis not present

## 2018-06-05 DIAGNOSIS — N529 Male erectile dysfunction, unspecified: Secondary | ICD-10-CM | POA: Diagnosis not present

## 2018-06-27 ENCOUNTER — Other Ambulatory Visit (HOSPITAL_COMMUNITY): Payer: Self-pay | Admitting: Nurse Practitioner

## 2018-10-16 ENCOUNTER — Encounter: Payer: Self-pay | Admitting: Cardiology

## 2018-10-16 ENCOUNTER — Telehealth: Payer: Self-pay | Admitting: *Deleted

## 2018-10-16 ENCOUNTER — Ambulatory Visit: Payer: Medicare HMO | Admitting: Cardiology

## 2018-10-16 VITALS — BP 140/70 | HR 55 | Ht 70.0 in | Wt 251.1 lb

## 2018-10-16 DIAGNOSIS — G4733 Obstructive sleep apnea (adult) (pediatric): Secondary | ICD-10-CM

## 2018-10-16 DIAGNOSIS — I480 Paroxysmal atrial fibrillation: Secondary | ICD-10-CM

## 2018-10-16 DIAGNOSIS — I420 Dilated cardiomyopathy: Secondary | ICD-10-CM | POA: Diagnosis not present

## 2018-10-16 DIAGNOSIS — I447 Left bundle-branch block, unspecified: Secondary | ICD-10-CM | POA: Diagnosis not present

## 2018-10-16 NOTE — Telephone Encounter (Signed)
PA for CPAP titration submitted to [employer] via web portal.

## 2018-10-16 NOTE — Progress Notes (Signed)
Cardiology Office Note   Date:  10/16/2018   ID:  [name] [name], DOB 06/11/1946, MRN [id number]  PCP:  [name] Dus, MD  Cardiologist:    Dr. Marlou [name]  Chief Complaint  Patient presents with  . Atrial Fibrillation    NICM      History of Present Illness: [name] [name] is a 73 y.o. male who presents for PAF, dilated CM. NICM  Pt with hx of nonischemic cardiomyopathy, mild to moderate nonobstructive CAD on heart catheterization 02/10/2018 here to establish care as a new patient.  He was seen in atrial fibrillation clinic with heart rate of 107 bpm, subsequent EKG showed sinus rhythm with left bundle branch block.  Chads vas score is 4, age hypertension remote TIAs x2.  Wife has [name] [name] problems at night.  Minister.  Had nuclear stress test which showed EF of 41%, peri-infarct ischemia  Had heart catheterization which showed mild nonobstructive CAD.  On last visit he had been feeling quite well.  No orthopnea PND shortness of breath bleeding chest pain.  Blood pressure is been under good control.  Every now and then he may have some mild heartburn when he takes his Xarelto after supper.  He takes a tiny amount of baking soda and this seems to help.    He has OSA , CKD 3  Obesity.  He needs to have CPAP titration in lab to obtain machine.  He is available for this in early March.  He has no chest pain and no SOB.  His wife with dementia now diagnosed with breast cancer and for mastectomy first of March.  His daughter will come to help.  He has a lot to monitor.  He has no palpitations and no awareness of rapid heart rate or irregular HR.  He has been started on crestor and welchol for his HLD.     Past Medical History:  Diagnosis Date  . Arthritis   . Gout   . Heart murmur   . Hypertension     Past Surgical History:  Procedure Laterality Date  . HERNIA REPAIR    . RIGHT/LEFT HEART CATH AND CORONARY ANGIOGRAPHY N/A 02/10/2018   Procedure: RIGHT/LEFT HEART CATH  AND CORONARY ANGIOGRAPHY;  Surgeon: [name] Bush, MD;  Location: [facility];  Service: Cardiovascular;  Laterality: N/A;     Current Outpatient Medications  Medication Sig Dispense Refill  . allopurinol (ZYLOPRIM) 300 MG tablet Take 300 mg by mouth daily.     Marland Kitchen amLODipine (NORVASC) 10 MG tablet Take 10 mg by mouth daily.    . colchicine 0.6 MG tablet Take 0.6 mg by mouth daily as needed (for gout).     . Colesevelam HCl (WELCHOL) 3.75 g PACK Take 3.75 g by mouth 3 (three) times a week.     . diphenhydrAMINE (BENADRYL) 25 mg capsule Take 25 mg by mouth every 6 (six) hours as needed for itching.    . docusate sodium (COLACE) 100 MG capsule Take 1 capsule (100 mg total) by mouth every 12 (twelve) hours. 60 capsule 0  . ezetimibe (ZETIA) 10 MG tablet Take 10 mg by mouth daily.  3  . gabapentin (NEURONTIN) 300 MG capsule Take 300 mg by mouth 3 (three) times daily as needed.    . hydrochlorothiazide (HYDRODIURIL) 12.5 MG tablet Take 12.5 mg by mouth daily.  0  . irbesartan (AVAPRO) 300 MG tablet Take 300 mg by mouth every morning.    . lubiprostone (AMITIZA) 24 MCG capsule  Take 24 mcg by mouth daily as needed for constipation.    . methocarbamol (ROBAXIN) 500 MG tablet Take 1 tablet (500 mg total) by mouth 3 (three) times daily between meals as needed. 20 tablet 0  . metoprolol succinate (TOPROL-XL) 100 MG 24 hr tablet Take 100 mg by mouth daily.    . Multiple Vitamin (MULTIVITAMIN WITH MINERALS) TABS tablet Take 1 tablet by mouth 2 (two) times a week.     . olmesartan (BENICAR) 40 MG tablet Take 40 mg by mouth daily.    . promethazine (PHENERGAN) 25 MG tablet Take 25 mg by mouth every 6 (six) hours as needed for nausea or vomiting.    . rosuvastatin (CRESTOR) 5 MG tablet Take 5 mg by mouth once a week.    . tamsulosin (FLOMAX) 0.4 MG CAPS capsule Take 0.4 mg by mouth daily.    . traMADol (ULTRAM) 50 MG tablet Take 50-100 mg by mouth every 6 (six) hours as needed for moderate pain.       Marland Kitchen XARELTO 20 MG TABS tablet TAKE 1 TABLET BY MOUTH ONCE DAILY WITH SUPPER 30 tablet 3   No current facility-administered medications for this visit.     Allergies:   Patient has no known allergies.    Social History:  The patient  reports that he has never smoked. He has never used smokeless tobacco. He reports that he does not drink alcohol or use drugs.   Family History:  The patient's family history includes Cancer in his mother; Heart disease in his father; Hypertension in his brother, father, mother, and sister.    ROS:  General:no colds or fevers, + weight increase Skin:no rashes or ulcers HEENT:no blurred vision, no congestion CV:see HPI PUL:see HPI GI:no diarrhea constipation or melena, no indigestion GU:no hematuria, no dysuria MS:no joint pain, no claudication Neuro:no syncope, no lightheadedness Endo:no diabetes, + thyroid disease  Wt Readings from Last 3 Encounters:  10/16/18 251 lb 1.9 oz (113.9 kg)  04/18/18 249 lb 1.9 oz (113 kg)  02/10/18 236 lb (107 kg)     PHYSICAL EXAM: VS:  BP 140/70   Pulse (!) 55   Ht 5\' 10"  (1.778 m)   Wt 251 lb 1.9 oz (113.9 kg)   BMI 36.03 kg/m  , BMI Body mass index is 36.03 kg/m. General:Pleasant affect, NAD Skin:Warm and dry, brisk capillary refill HEENT:normocephalic, sclera clear, mucus membranes moist Neck:supple, no JVD, no bruits  Heart:S1S2 RRR without murmur, gallup, rub or click Lungs:clear without rales, rhonchi, or wheezes TIW:PYKD, non tender, + BS, do not palpate liver spleen or masses Ext:no lower ext edema, 2+ pedal pulses, 2+ radial pulses Neuro:alert and oriented X 3, MAE, follows commands, + facial symmetry    EKG:  EKG is ordered today. The ekg ordered today demonstrates SB at 35, LAD, LBBB no changes.   Recent Labs: 02/03/2018: Hemoglobin 13.8; Platelets 217 02/10/2018: BUN 15; Creatinine, Ser 1.47; Potassium 3.9; Sodium 138    Lipid Panel No results found for: CHOL, TRIG, HDL, CHOLHDL, VLDL,  LDLCALC, LDLDIRECT     Other studies Reviewed: Additional studies/ records that were reviewed today include: . Cardiac cath 02/10/18 Conclusions: 1. Non-obstructive coronary artery disease, including 50% distal LCx stenosis. 2. Normal left and right heart filling pressures. 3. Upper normal pulmonary artery pressure. 4. Normal Fick cardiac output/index.  Recommendations: 1. Medical therapy and risk factor modification to prevent progression of coronary artery disease. 2. Continue medical management of non-ischemic cardiomyopathy.  Escalation of antihypertensive  regimen will need to be considered. 3. If no evidence of bleeding or vascular injury, rivaroxaban can be restarted tomorrow evening.  Nuc 01/20/18 Study Highlights     Nuclear stress EF: 41%.  There was no ST segment deviation noted during stress.  Findings consistent with prior myocardial infarction with peri-infarct ischemia.  This is an intermediate risk study.  The left ventricular ejection fraction is moderately decreased (30-44%).   Likely inferior wall infarct from apex to base with mild peri infarct apical ischemia Significant diaphragmatic attenuation however EF 41% diffuse hypokinesis worse in the inferior wall     01/20/18 Echo Study Conclusions  - Left ventricle: The cavity size was normal. Wall thickness was   increased in a pattern of moderate LVH. Systolic function was   mildly reduced. The estimated ejection fraction was in the range   of 45% to 50%. Diffuse hypokinesis. Doppler parameters are   consistent with abnormal left ventricular relaxation (grade 1   diastolic dysfunction). - Ventricular septum: Septal motion showed &quot;bounce&quot;. - Mitral valve: There was mild regurgitation. - Left atrium: The atrium was mildly dilated.   ASSESSMENT AND PLAN:  1.  PAF maintaining SR.  On xarelto with no bleeding. Follow up with Dr. Marlou [name] in 6 months.  2.  Dilated CM with EF 45-50% NOCM.   euvolemic   3.  Sleep apnea needs CPAP titration in lab.  Will ask Mariann Laster with Dr. Claiborne Billings to arrange. Pt available first or second week in [place]  4.  HLD with LDL 130 now on welchol and crestor and tolerating.   5.  Morbid obesity currently under increased stress.  Will discuss after his wife's surgery.     Current medicines are reviewed with the patient today.  The patient Has no concerns regarding medicines.  The following changes have been made:  See above Labs/ tests ordered today include:see above  Disposition:   FU:  see above  Signed, Cecilie Kicks, NP  10/16/2018 4:19 PM    [address], [place], [place] Phone: [phone]; Fax: [phone]

## 2018-10-16 NOTE — Telephone Encounter (Signed)
-----   Message from [place], [place] sent at 10/16/2018  2:18 PM EST ----- Regarding: in lab sleep titration Pt needs to be set up for in lab sleep titration around 2nd week in March. Can someone set up?

## 2018-10-16 NOTE — Patient Instructions (Addendum)
Medication Instructions:  Your physician recommends that you continue on your current medications as directed. Please refer to the Current Medication list given to you today.  If you need a refill on your cardiac medications before your next appointment, please call your pharmacy.   Lab work: None ordered  If you have labs (blood work) drawn today and your tests are completely normal, you will receive your results only by: Marland Kitchen MyChart Message (if you have MyChart) OR . A paper copy in the mail If you have any lab test that is abnormal or we need to change your treatment, we will call you to review the results.  Testing/Procedures: None ordered  Follow-Up: At [facility], you and your health needs are our priority.  As part of our continuing mission to provide you with exceptional heart care, we have created designated Provider Care Teams.  These Care Teams include your primary Cardiologist (physician) and Advanced Practice Providers (APPs -  Physician Assistants and Nurse Practitioners) who all work together to provide you with the care you need, when you need it. You will need a follow up appointment in 6 months.  Please call our office 2 months in advance to schedule this appointment.  You may see Dr. Marlou Porch or one of the following Advanced Practice Providers on your designated Care Team:   Truitt Merle, NP Cecilie Kicks, NP . Kathyrn Drown, NP  Any Other Special Instructions Will Be Listed Below (If Applicable).

## 2018-10-25 ENCOUNTER — Telehealth: Payer: Self-pay | Admitting: *Deleted

## 2018-10-25 NOTE — Telephone Encounter (Signed)
-----   Message from [place], [place] sent at 10/16/2018  2:18 PM EST ----- Regarding: in lab sleep titration Pt needs to be set up for in lab sleep titration around 2nd week in March. Can someone set up?

## 2018-10-25 NOTE — Telephone Encounter (Signed)
In lab denied, APAP is done at home as auto titration. Thanks

## 2018-10-25 NOTE — Telephone Encounter (Signed)
Staff message sent to Grover Canavan denied CPAP titration. Recommend APAP. No PA required for APAP.

## 2018-10-25 NOTE — Telephone Encounter (Signed)
-----   Message from Lauralee Evener, [place] sent at 10/25/2018 10:57 AM EST ----- Regarding: RE: in lab sleep titration CPAP titration has been denied by [employer]. Recommend APAP.let provider know. No PA required for APAP. ----- Message ----- From: Jeanann Lewandowsky, RMA Sent: 10/16/2018   2:18 PM EST To: Lauralee Evener, CMA, Cv Div Sleep Studies Subject: in lab sleep titration                         Pt needs to be set up for in lab sleep titration around 2nd week in March. Can someone set up?

## 2018-10-25 NOTE — Telephone Encounter (Signed)
After review it has been denied, they will send a fax with appeal information. Office fax number, and advised to send Attn: Erasmo Score.

## 2018-10-30 ENCOUNTER — Telehealth: Payer: Self-pay | Admitting: *Deleted

## 2018-10-30 ENCOUNTER — Other Ambulatory Visit (HOSPITAL_COMMUNITY): Payer: Self-pay | Admitting: Nurse Practitioner

## 2018-10-30 NOTE — Telephone Encounter (Signed)
Patient notified insurance denied in lab CPAP titration. APAP orders sent to [facility].

## 2018-10-30 NOTE — Telephone Encounter (Signed)
Pt last saw Cecilie Kicks, NP on 10/16/18, last labs 02/10/18 Creat 1.47, age 73, weight 113.9kg, CrCl 75.33, based on CrCl pt is on appropriate dosage of Xarelto 20mg  QD.  Will refill rx.

## 2018-10-30 NOTE — Telephone Encounter (Signed)
-----   Message from Freada Bergeron, [place] sent at 10/26/2018  1:29 PM EST ----- Regarding: FW: in lab sleep titration denied but ok'd APAP  ----- Message ----- From: Sueanne Margarita, MD Sent: 10/26/2018  12:34 PM EST To: Freada Bergeron, CMA Subject: RE: in lab sleep titration denied but ok'd A#  This needs to go to Dr. Claiborne Billings as he read the sleep study. ----- Message ----- From: Freada Bergeron, CMA Sent: 10/26/2018  12:31 PM EST To: Sueanne Margarita, MD Subject: FW: in lab sleep titration denied but ok'd A#  Please write settings for APAP. Thanks ----- Message ----- From: Isaiah Serge, NP Sent: 10/25/2018  10:10 PM EST To: Freada Bergeron, CMA Subject: RE: in lab sleep titration                     So she will have home sleep study?    ----- Message ----- From: Freada Bergeron, CMA Sent: 10/25/2018   5:59 PM EST To: Isaiah Serge, NP, Jeanann Lewandowsky, RMA Subject: FW: in lab sleep titration                     In lab denied, APAP is done at home as auto titration. Thanks ----- Message ----- From: Lauralee Evener, CMA Sent: 10/25/2018  10:57 AM EST To: Freada Bergeron, CMA Subject: RE: in lab sleep titration                     CPAP titration has been denied by [employer]. Recommend APAP.let provider know. No PA required for APAP. ----- Message ----- From: Jeanann Lewandowsky, RMA Sent: 10/16/2018   2:18 PM EST To: Lauralee Evener, CMA, Cv Div Sleep Studies Subject: in lab sleep titration                         Pt needs to be set up for in lab sleep titration around 2nd week in March. Can someone set up?

## 2018-11-08 DIAGNOSIS — G4733 Obstructive sleep apnea (adult) (pediatric): Secondary | ICD-10-CM | POA: Diagnosis not present

## 2018-12-09 DIAGNOSIS — G4733 Obstructive sleep apnea (adult) (pediatric): Secondary | ICD-10-CM | POA: Diagnosis not present

## 2019-01-08 DIAGNOSIS — G4733 Obstructive sleep apnea (adult) (pediatric): Secondary | ICD-10-CM | POA: Diagnosis not present

## 2019-02-08 DIAGNOSIS — G4733 Obstructive sleep apnea (adult) (pediatric): Secondary | ICD-10-CM | POA: Diagnosis not present

## 2019-03-10 DIAGNOSIS — G4733 Obstructive sleep apnea (adult) (pediatric): Secondary | ICD-10-CM | POA: Diagnosis not present

## 2019-03-13 DIAGNOSIS — G4733 Obstructive sleep apnea (adult) (pediatric): Secondary | ICD-10-CM | POA: Diagnosis not present

## 2019-03-29 ENCOUNTER — Telehealth: Payer: Self-pay

## 2019-03-29 ENCOUNTER — Telehealth: Payer: Self-pay | Admitting: Cardiology

## 2019-03-29 NOTE — Telephone Encounter (Signed)
The pt is advised that we have left him 2 bottles of Xarelto samples in the downstairs lobby of our [place] office at [address] in [place].  I also gave him Wynetta Emery and [employer] pt asst program phone number so he can call to check the progress of his application periodically.  He verbalized understanding and thanked me for calling him back.

## 2019-03-29 NOTE — Telephone Encounter (Signed)
The patient was calling to check if the office received paperwork from his insurance company regarding the patient's Xarelto. The patient is out of this medication.  The insurance company was going to fax over some paperwork so that the patient could receive the medication for free.   Please advise.

## 2019-03-29 NOTE — Telephone Encounter (Signed)
The pts completed [employer] Pt Asst Application was left at the office. We have completed the providers part of the application, Dr Radford Pax (DOD) has signed it, and we faxed all to [place] pt asst program.

## 2019-04-02 NOTE — Telephone Encounter (Signed)
We received a letter from [facility] stating that they need additional information for the Pt Asst application.  I called [employer] and spoke with Bruce who informed me that the pt sent in 2019 & 2020 out of pocket expense. They only need the 2020 and it needs to be separate from the 2019. I will call pt and have him get the 2020 expense and bring it to the office and we will fax it for him to:   Toma Aran F# [phone]

## 2019-04-04 NOTE — Telephone Encounter (Signed)
**Note De-Identified Cythnia Osmun Obfuscation** The pt left his 2020 out of pocket expense report at the office to be faxed to [place] pt asst program as requested by them.  Shortly afterwards, we received a letter from [employer] pt asst program stating that they have denied pt asst for this pt. Reason: Xarelto is covered by the pts ins provider.  The letter states that they have notified the pt of this denial as well.

## 2019-04-10 DIAGNOSIS — G4733 Obstructive sleep apnea (adult) (pediatric): Secondary | ICD-10-CM | POA: Diagnosis not present

## 2019-04-12 DIAGNOSIS — I4891 Unspecified atrial fibrillation: Secondary | ICD-10-CM | POA: Diagnosis not present

## 2019-04-12 DIAGNOSIS — E7800 Pure hypercholesterolemia, unspecified: Secondary | ICD-10-CM | POA: Diagnosis not present

## 2019-04-12 DIAGNOSIS — I129 Hypertensive chronic kidney disease with stage 1 through stage 4 chronic kidney disease, or unspecified chronic kidney disease: Secondary | ICD-10-CM | POA: Diagnosis not present

## 2019-04-12 DIAGNOSIS — I480 Paroxysmal atrial fibrillation: Secondary | ICD-10-CM | POA: Diagnosis not present

## 2019-04-12 DIAGNOSIS — N400 Enlarged prostate without lower urinary tract symptoms: Secondary | ICD-10-CM | POA: Diagnosis not present

## 2019-04-12 DIAGNOSIS — N183 Chronic kidney disease, stage 3 (moderate): Secondary | ICD-10-CM | POA: Diagnosis not present

## 2019-04-12 DIAGNOSIS — I10 Essential (primary) hypertension: Secondary | ICD-10-CM | POA: Diagnosis not present

## 2019-04-13 ENCOUNTER — Other Ambulatory Visit: Payer: Self-pay | Admitting: Cardiology

## 2019-04-16 NOTE — Telephone Encounter (Signed)
41m Scr 1.43 06/05/18 113.9kg ccr 76 ml/min Lovw/ingold 10/16/18

## 2019-05-11 DIAGNOSIS — G4733 Obstructive sleep apnea (adult) (pediatric): Secondary | ICD-10-CM | POA: Diagnosis not present

## 2019-05-17 DIAGNOSIS — Z23 Encounter for immunization: Secondary | ICD-10-CM | POA: Diagnosis not present

## 2019-05-28 DIAGNOSIS — I10 Essential (primary) hypertension: Secondary | ICD-10-CM | POA: Diagnosis not present

## 2019-05-28 DIAGNOSIS — I4891 Unspecified atrial fibrillation: Secondary | ICD-10-CM | POA: Diagnosis not present

## 2019-05-28 DIAGNOSIS — N183 Chronic kidney disease, stage 3 (moderate): Secondary | ICD-10-CM | POA: Diagnosis not present

## 2019-05-28 DIAGNOSIS — I480 Paroxysmal atrial fibrillation: Secondary | ICD-10-CM | POA: Diagnosis not present

## 2019-05-28 DIAGNOSIS — E7800 Pure hypercholesterolemia, unspecified: Secondary | ICD-10-CM | POA: Diagnosis not present

## 2019-05-28 DIAGNOSIS — I129 Hypertensive chronic kidney disease with stage 1 through stage 4 chronic kidney disease, or unspecified chronic kidney disease: Secondary | ICD-10-CM | POA: Diagnosis not present

## 2019-05-28 DIAGNOSIS — N400 Enlarged prostate without lower urinary tract symptoms: Secondary | ICD-10-CM | POA: Diagnosis not present

## 2019-06-06 DIAGNOSIS — Z6837 Body mass index (BMI) 37.0-37.9, adult: Secondary | ICD-10-CM | POA: Diagnosis not present

## 2019-06-06 DIAGNOSIS — K5900 Constipation, unspecified: Secondary | ICD-10-CM | POA: Diagnosis not present

## 2019-06-06 DIAGNOSIS — I129 Hypertensive chronic kidney disease with stage 1 through stage 4 chronic kidney disease, or unspecified chronic kidney disease: Secondary | ICD-10-CM | POA: Diagnosis not present

## 2019-06-06 DIAGNOSIS — E7800 Pure hypercholesterolemia, unspecified: Secondary | ICD-10-CM | POA: Diagnosis not present

## 2019-06-06 DIAGNOSIS — Z0000 Encounter for general adult medical examination without abnormal findings: Secondary | ICD-10-CM | POA: Diagnosis not present

## 2019-06-06 DIAGNOSIS — M109 Gout, unspecified: Secondary | ICD-10-CM | POA: Diagnosis not present

## 2019-06-06 DIAGNOSIS — I480 Paroxysmal atrial fibrillation: Secondary | ICD-10-CM | POA: Diagnosis not present

## 2019-06-06 DIAGNOSIS — N400 Enlarged prostate without lower urinary tract symptoms: Secondary | ICD-10-CM | POA: Diagnosis not present

## 2019-06-06 DIAGNOSIS — B0229 Other postherpetic nervous system involvement: Secondary | ICD-10-CM | POA: Diagnosis not present

## 2019-06-10 DIAGNOSIS — G4733 Obstructive sleep apnea (adult) (pediatric): Secondary | ICD-10-CM | POA: Diagnosis not present

## 2019-06-15 DIAGNOSIS — G4733 Obstructive sleep apnea (adult) (pediatric): Secondary | ICD-10-CM | POA: Diagnosis not present

## 2019-06-28 DIAGNOSIS — I480 Paroxysmal atrial fibrillation: Secondary | ICD-10-CM | POA: Diagnosis not present

## 2019-06-28 DIAGNOSIS — E7800 Pure hypercholesterolemia, unspecified: Secondary | ICD-10-CM | POA: Diagnosis not present

## 2019-06-28 DIAGNOSIS — N400 Enlarged prostate without lower urinary tract symptoms: Secondary | ICD-10-CM | POA: Diagnosis not present

## 2019-06-28 DIAGNOSIS — I129 Hypertensive chronic kidney disease with stage 1 through stage 4 chronic kidney disease, or unspecified chronic kidney disease: Secondary | ICD-10-CM | POA: Diagnosis not present

## 2019-06-28 DIAGNOSIS — I4891 Unspecified atrial fibrillation: Secondary | ICD-10-CM | POA: Diagnosis not present

## 2019-06-28 DIAGNOSIS — N183 Chronic kidney disease, stage 3 unspecified: Secondary | ICD-10-CM | POA: Diagnosis not present

## 2019-06-28 DIAGNOSIS — I10 Essential (primary) hypertension: Secondary | ICD-10-CM | POA: Diagnosis not present

## 2019-07-01 ENCOUNTER — Other Ambulatory Visit: Payer: Self-pay | Admitting: Cardiology

## 2019-07-05 ENCOUNTER — Telehealth: Payer: Self-pay | Admitting: Cardiology

## 2019-07-05 NOTE — Telephone Encounter (Signed)
Did try reaching out to the pt to follow-up on compliance with taking Xarelto, and to see if she is currently out of this medication, until prior authorization nurse can further assist with cost.  Pt did not answer, so I left her VM to call the office back and request to speak with a triage nurse, or our Prior Auth Nurse.

## 2019-07-05 NOTE — Telephone Encounter (Signed)
Pharmacy called stating the patient advised them XARELTO 20 MG TABS tablet is too expensive for them, they would like to know if there is something else that can be prescribed for the patient.    [facility] pharmacy number is [phone], fax # [phone]

## 2019-07-05 NOTE — Telephone Encounter (Signed)
[name] [name] is there anything you can do to assist this pt with her Xarelto?  She is inquiring it is too expensive, per her pharmacy.  Please advise.

## 2019-07-06 DIAGNOSIS — R319 Hematuria, unspecified: Secondary | ICD-10-CM | POA: Diagnosis not present

## 2019-07-06 DIAGNOSIS — N342 Other urethritis: Secondary | ICD-10-CM | POA: Diagnosis not present

## 2019-07-06 NOTE — Telephone Encounter (Signed)
Patient returning call.

## 2019-07-06 NOTE — Telephone Encounter (Signed)
**Note De-Identified [name] [name]** The pt applied for and was denied pt asst through [place] (see phone note form 03/29/2019) because the his insurance was paying their part on his Xarelto per his plan limits.  I called [facility] and was advised that the cost for the pts Xarelto is $120/30 day supply and that he is not in the donut hole because his insurance is still paying their part for his Xarelto.  I contacted the pt to discuss but in the middle of our conversation he stated "I have to go Im at a doctors visit right now but I will call you back".  I did advised him that I will be in the office for about another hour today and if he calls back after that time I will call him back on Monday 07/09/2019.

## 2019-07-06 NOTE — Telephone Encounter (Signed)
**Note De-Identified [name] [name] Obfuscation** Pt does not know if he has a deductible or not but states that he is going to call his ins plan with questions and will call me back to let me know.

## 2019-07-10 NOTE — Telephone Encounter (Signed)
Follow up   Patient states that he is returning you call. Please call.

## 2019-07-10 NOTE — Telephone Encounter (Signed)
The pt is applying for pt asst again through [employer] for his Xarelto with help from Noemi Chapel from [place] [facility]. Mickel Baas advised me that the pt has not taken Xarelto since July.  I called the pt and left a message asking him to call me back and he did.  The pt states that he has not missed a dose of his Xarelto. He states that we gave him some samples of Xarelto and that he has purchased a refill so he has had some on hand all along.   He is aware that we are working with Noemi Chapel and will complete the provider part of the [employer] pt asst application that she faxed to [place] and will fax back to [place]. He is also aware that if he is not approved for assistance and he cannot afford Xarelto we may need to consider Warfarin as it is important that he take a anticoagulant.  He verbalized understanding and is in agreement.

## 2019-07-11 DIAGNOSIS — G4733 Obstructive sleep apnea (adult) (pediatric): Secondary | ICD-10-CM | POA: Diagnosis not present

## 2019-07-12 NOTE — Telephone Encounter (Addendum)
We have completed the provider part of the pts [employer] pt asst application for Xarelto that was faxed to [place] from Noemi Chapel with [place] [facility] [place]. Dr Marlou Porch has signed it and we faxed it back to Mickel Baas at [phone].

## 2019-07-23 ENCOUNTER — Other Ambulatory Visit: Payer: Self-pay | Admitting: Cardiology

## 2019-07-25 DIAGNOSIS — I4891 Unspecified atrial fibrillation: Secondary | ICD-10-CM | POA: Diagnosis not present

## 2019-07-25 DIAGNOSIS — E7800 Pure hypercholesterolemia, unspecified: Secondary | ICD-10-CM | POA: Diagnosis not present

## 2019-07-25 DIAGNOSIS — N183 Chronic kidney disease, stage 3 unspecified: Secondary | ICD-10-CM | POA: Diagnosis not present

## 2019-07-25 DIAGNOSIS — I129 Hypertensive chronic kidney disease with stage 1 through stage 4 chronic kidney disease, or unspecified chronic kidney disease: Secondary | ICD-10-CM | POA: Diagnosis not present

## 2019-07-25 DIAGNOSIS — N400 Enlarged prostate without lower urinary tract symptoms: Secondary | ICD-10-CM | POA: Diagnosis not present

## 2019-07-25 DIAGNOSIS — I480 Paroxysmal atrial fibrillation: Secondary | ICD-10-CM | POA: Diagnosis not present

## 2019-07-25 DIAGNOSIS — I10 Essential (primary) hypertension: Secondary | ICD-10-CM | POA: Diagnosis not present

## 2019-08-10 DIAGNOSIS — G4733 Obstructive sleep apnea (adult) (pediatric): Secondary | ICD-10-CM | POA: Diagnosis not present

## 2019-08-20 NOTE — Telephone Encounter (Signed)
I refaxed the pt asst provider page to Mickel Baas at [place] at [phone].

## 2019-08-30 DIAGNOSIS — E7800 Pure hypercholesterolemia, unspecified: Secondary | ICD-10-CM | POA: Diagnosis not present

## 2019-08-30 DIAGNOSIS — I129 Hypertensive chronic kidney disease with stage 1 through stage 4 chronic kidney disease, or unspecified chronic kidney disease: Secondary | ICD-10-CM | POA: Diagnosis not present

## 2019-08-30 DIAGNOSIS — I480 Paroxysmal atrial fibrillation: Secondary | ICD-10-CM | POA: Diagnosis not present

## 2019-08-30 DIAGNOSIS — N400 Enlarged prostate without lower urinary tract symptoms: Secondary | ICD-10-CM | POA: Diagnosis not present

## 2019-08-30 DIAGNOSIS — I10 Essential (primary) hypertension: Secondary | ICD-10-CM | POA: Diagnosis not present

## 2019-08-30 DIAGNOSIS — I4891 Unspecified atrial fibrillation: Secondary | ICD-10-CM | POA: Diagnosis not present

## 2019-09-12 ENCOUNTER — Ambulatory Visit (INDEPENDENT_AMBULATORY_CARE_PROVIDER_SITE_OTHER): Payer: Medicare Other | Admitting: Cardiology

## 2019-09-12 ENCOUNTER — Other Ambulatory Visit: Payer: Self-pay

## 2019-09-12 ENCOUNTER — Encounter: Payer: Self-pay | Admitting: Cardiology

## 2019-09-12 VITALS — BP 158/74 | HR 58 | Ht 70.0 in | Wt 251.0 lb

## 2019-09-12 DIAGNOSIS — I447 Left bundle-branch block, unspecified: Secondary | ICD-10-CM

## 2019-09-12 DIAGNOSIS — I420 Dilated cardiomyopathy: Secondary | ICD-10-CM | POA: Diagnosis not present

## 2019-09-12 DIAGNOSIS — I480 Paroxysmal atrial fibrillation: Secondary | ICD-10-CM | POA: Diagnosis not present

## 2019-09-12 DIAGNOSIS — G4733 Obstructive sleep apnea (adult) (pediatric): Secondary | ICD-10-CM

## 2019-09-12 NOTE — Patient Instructions (Signed)
Medication Instructions:  The current medical regimen is effective;  continue present plan and medications.  *If you need a refill on your cardiac medications before your next appointment, please call your pharmacy*  Follow-Up: At [facility], you and your health needs are our priority.  As part of our continuing mission to provide you with exceptional heart care, we have created designated Provider Care Teams.  These Care Teams include your primary Cardiologist (physician) and Advanced Practice Providers (APPs -  Physician Assistants and Nurse Practitioners) who all work together to provide you with the care you need, when you need it.  Your next appointment:   6 month(s)  The format for your next appointment:   In Person  Provider:   Cecilie Kicks, NP and Dr Marlou Porch in 1 year.  Thank you for choosing [facility]!!

## 2019-09-12 NOTE — Progress Notes (Signed)
Cardiology Office Note:    Date:  09/12/2019   ID:  [name] [name], DOB December 03, 1945, MRN [id number]  PCP:  Maury Dus, MD  Cardiologist:  Candee Furbish, MD   Referring MD: Maury Dus, MD     History of Present Illness:    [name] [name] is a 74 y.o. male with nonischemic cardiomyopathy, mild to moderate nonobstructive CAD on heart catheterization 02/10/2018 here to establish care as a new patient.  He was seen in atrial fibrillation clinic with heart rate of 107 bpm, subsequent EKG showed sinus rhythm with left bundle branch block.  Chads vas score is 4, age hypertension remote TIAs x2.  Wife has Alzheimer's problems at night.  Minister.  Had nuclear stress test which showed EF of 41%, peri-infarct ischemia  Had heart catheterization which showed mild nonobstructive CAD.  Overall he has been feeling quite well.  No orthopnea PND shortness of breath bleeding chest pain.  Blood pressure is been under good control.  Every now and then he may have some mild heartburn when he takes his Xarelto after supper.  He takes a tiny amount of baking soda and this seems to help.  09/12/2019-here for follow-up of atrial fibrillation dilated cardiomyopathy.  Overall been doing quite well however he ran out of Xarelto, cost has been an issue.  Knows about Coumadin but does not want to come in for fingersticks because wife has dementia takes care of her at home.  Denies any fevers chills nausea vomiting syncope bleeding.  Chronic left bundle branch block.    Past Medical History:  Diagnosis Date  . Arthritis   . Gout   . Heart murmur   . Hypertension     Past Surgical History:  Procedure Laterality Date  . HERNIA REPAIR    . RIGHT/LEFT HEART CATH AND CORONARY ANGIOGRAPHY N/A 02/10/2018   Procedure: RIGHT/LEFT HEART CATH AND CORONARY ANGIOGRAPHY;  Surgeon: Nelva Bush, MD;  Location: [facility];  Service: Cardiovascular;  Laterality: N/A;    Current Medications: Current  Meds  Medication Sig  . allopurinol (ZYLOPRIM) 300 MG tablet Take 300 mg by mouth daily.   Marland Kitchen amLODipine (NORVASC) 10 MG tablet Take 10 mg by mouth daily.  . colchicine 0.6 MG tablet Take 0.6 mg by mouth daily as needed (for gout).   . Colesevelam HCl (WELCHOL) 3.75 g PACK Take 3.75 g by mouth 3 (three) times a week.   . diphenhydrAMINE (BENADRYL) 25 mg capsule Take 25 mg by mouth every 6 (six) hours as needed for itching.  . docusate sodium (COLACE) 100 MG capsule Take 1 capsule (100 mg total) by mouth every 12 (twelve) hours.  Marland Kitchen ezetimibe (ZETIA) 10 MG tablet Take 10 mg by mouth daily.  Marland Kitchen gabapentin (NEURONTIN) 300 MG capsule Take 300 mg by mouth 3 (three) times daily as needed.  . hydrochlorothiazide (HYDRODIURIL) 12.5 MG tablet Take 12.5 mg by mouth daily.  . irbesartan (AVAPRO) 300 MG tablet Take 300 mg by mouth every morning.  . lubiprostone (AMITIZA) 24 MCG capsule Take 24 mcg by mouth daily as needed for constipation.  . methocarbamol (ROBAXIN) 500 MG tablet Take 1 tablet (500 mg total) by mouth 3 (three) times daily between meals as needed.  . metoprolol succinate (TOPROL-XL) 100 MG 24 hr tablet Take 100 mg by mouth daily.  . Multiple Vitamin (MULTIVITAMIN WITH MINERALS) TABS tablet Take 1 tablet by mouth 2 (two) times a week.   . olmesartan (BENICAR) 40 MG tablet Take 40  mg by mouth daily.  . promethazine (PHENERGAN) 25 MG tablet Take 25 mg by mouth every 6 (six) hours as needed for nausea or vomiting.  . rosuvastatin (CRESTOR) 5 MG tablet Take 5 mg by mouth once a week.  . tamsulosin (FLOMAX) 0.4 MG CAPS capsule Take 0.4 mg by mouth daily.  . traMADol (ULTRAM) 50 MG tablet Take 50-100 mg by mouth every 6 (six) hours as needed for moderate pain.   Marland Kitchen XARELTO 20 MG TABS tablet TAKE 1 TABLET BY MOUTH EVERY DAY WITH SUPPER     Allergies:   Patient has no known allergies.   Social History   Socioeconomic History  . Marital status: Single    Spouse name: Not on file  . Number of  children: Not on file  . Years of education: Not on file  . Highest education level: Not on file  Occupational History  . Not on file  Tobacco Use  . Smoking status: Never Smoker  . Smokeless tobacco: Never Used  Substance and Sexual Activity  . Alcohol use: No  . Drug use: No  . Sexual activity: Not on file  Other Topics Concern  . Not on file  Social History Narrative  . Not on file   Social Determinants of Health   Financial Resource Strain:   . Difficulty of Paying Living Expenses: Not on file  Food Insecurity:   . Worried About Charity fundraiser in the Last Year: Not on file  . Ran Out of Food in the Last Year: Not on file  Transportation Needs:   . Lack of Transportation (Medical): Not on file  . Lack of Transportation (Non-Medical): Not on file  Physical Activity:   . Days of Exercise per Week: Not on file  . Minutes of Exercise per Session: Not on file  Stress:   . Feeling of Stress : Not on file  Social Connections:   . Frequency of Communication with Friends and Family: Not on file  . Frequency of Social Gatherings with Friends and Family: Not on file  . Attends Religious Services: Not on file  . Active Member of Clubs or Organizations: Not on file  . Attends Archivist Meetings: Not on file  . Marital Status: Not on file     Family History: The patient's family history includes Cancer in his mother; Heart disease in his father; Hypertension in his brother, father, mother, and sister.  ROS:   Please see the history of present illness.     All other systems reviewed and are negative.  EKGs/Labs/Other Studies Reviewed:    The following studies were reviewed today: Prior office notes, lab work, EKG, echocardiogram, catheterization reviewed  EKG: Left bundle branch block, atrial fibrillation then sinus rhythm  Recent Labs: No results found for requested labs within last 8760 hours.  Recent Lipid Panel No results found for: CHOL, TRIG, HDL,  CHOLHDL, VLDL, LDLCALC, LDLDIRECT  Physical Exam:    VS:  BP (!) 158/74   Pulse (!) 58   Ht 5\' 10"  (1.778 m)   Wt 251 lb (113.9 kg)   SpO2 98%   BMI 36.01 kg/m     Wt Readings from Last 3 Encounters:  09/12/19 251 lb (113.9 kg)  10/16/18 251 lb 1.9 oz (113.9 kg)  04/18/18 249 lb 1.9 oz (113 kg)     GEN: Overweight well nourished, well developed in no acute distress HEENT: Normal NECK: No JVD; No carotid bruits LYMPHATICS: No lymphadenopathy CARDIAC:  RRR, no murmurs, rubs, gallops RESPIRATORY:  Clear to auscultation without rales, wheezing or rhonchi  ABDOMEN: Soft, non-tender, non-distended MUSCULOSKELETAL:  No edema; No deformity  SKIN: Warm and dry NEUROLOGIC:  Alert and oriented x 3 PSYCHIATRIC:  Normal affect   ASSESSMENT:    1. Paroxysmal atrial fibrillation (HCC)   2. Dilated cardiomyopathy ([place])   3. LBBB (left bundle branch block)   4. Morbid obesity ([place])   5. Obstructive sleep apnea (adult) (pediatric)    PLAN:    In order of problems listed above:  Dilated cardiomyopathy -Nonischemic, likely hypertensive. -Medications reviewed as above he is on goal-directed therapy.  NYHA class I.  Doing well.  No changes.  EF 45 to 50% on echocardiogram, mildly reduced.  No changes in symptoms.  Paroxysmal atrial fibrillation -Continue with Xarelto.  He should be on this lifelong given his prior atrial fibrillation.  Pam look this up and he has filed a couple times for patient assistance.  Had long lapses of not taking the medication secondary to cost.  Coumadin was discussed but he does not want to leave his wife secondary to dementia for checks frequently.  Chads vas score greater than 2.  No signs of bleeding.  Monitor his CBC and basic metabolic profile every 6 months.  In October hemoglobin 13.2 creatinine 1.57.  LBBB  - stable  Hyperlipidemia - Currently on Zetia.  Outside labs LDL 132  Essential hypertension/hypertensive heart disease -Medications multidrug  regimen reviewed which includes beta-blocker and angiotensin receptor blocker.  No changes made  Obstructive sleep apnea -Encourage CPAP use.  Chronic kidney disease stage III -Avoid NSAIDs.  He states that he knows only to take Tylenol.  Creatinine 1.57 as above  Morbid obesity -BMI greater than 35 with 2 or more comorbidities.  Continue to encourage weight loss.  Decrease carbohydrates.  No changes made.  6 months Mickel Baas 1 year me  Medication Adjustments/Labs and Tests Ordered: Current medicines are reviewed at length with the patient today.  Concerns regarding medicines are outlined above.  Orders Placed This Encounter  Procedures  . EKG 12-Lead   No orders of the defined types were placed in this encounter.   Patient Instructions  Medication Instructions:  The current medical regimen is effective;  continue present plan and medications.  *If you need a refill on your cardiac medications before your next appointment, please call your pharmacy*  Follow-Up: At [facility], you and your health needs are our priority.  As part of our continuing mission to provide you with exceptional heart care, we have created designated Provider Care Teams.  These Care Teams include your primary Cardiologist (physician) and Advanced Practice Providers (APPs -  Physician Assistants and Nurse Practitioners) who all work together to provide you with the care you need, when you need it.  Your next appointment:   6 month(s)  The format for your next appointment:   In Person  Provider:   Cecilie Kicks, NP and Dr Marlou Porch in 1 year.  Thank you for choosing [facility]!!        Signed, Candee Furbish, MD  09/12/2019 5:03 PM    [place]

## 2019-09-17 ENCOUNTER — Telehealth (HOSPITAL_COMMUNITY): Payer: Self-pay | Admitting: Licensed Clinical Social Worker

## 2019-09-17 ENCOUNTER — Telehealth: Payer: Self-pay | Admitting: Cardiology

## 2019-09-17 NOTE — Telephone Encounter (Signed)
CSW received consult to help pt with medication costs concerns.  Per consult pt paying $120/30 day supply of medication which is not affordable for patient.    CSW called MD office and requested that a tier exception be submitted.  CSW then called pt who to discuss other options.  Explained [employer] patient assistance program- pt is agreeable to CSW sending out application as CSW explained that even though he likely won't qualify upfront it could come in handy later in the year when he has reached his copay requirement so he would potentially be eligible for assistance when he reaches the donut hole.  CSW also explained Extra Help program.  Per patients initial report he could be eligible for this program based off of income and assets for him and his wife- pt will gather their financial information and CSW will call them back tomorrow to discuss and assist with application process.  CSW will continue to follow and assist as needed  [name] [name], [facility] Desk#: [phone] Cell#: [phone]

## 2019-09-17 NOTE — Telephone Encounter (Signed)
Will route to prior auth nurse to see if we can assist with getting cost down.

## 2019-09-17 NOTE — Telephone Encounter (Signed)
New Message  [name] [name], who is a Education officer, museum, called in behalf of the pt. XARELTO 20 MG TABS tablet - pt cannot afford the medication for $120.00. Wants to know if he can go to a lower tier to afford medication  Please call to discuss

## 2019-09-17 NOTE — Telephone Encounter (Signed)
**Note De-Identified Emiya Loomer Obfuscation** I started a Xarelto tier exception through covermymeds: Key: [id number]

## 2019-09-20 NOTE — Telephone Encounter (Signed)
**Note De-Identified [name]** I have stated another Xarelto tier exception as the pt has new ins per his pharmacy.  [name] [name] (Key: [id number])  Need help? Call us at [phone]  Status  Sent to Plan today  Next Steps  The plan will fax you a determination, typically within 1 to 5 business days.  DrugXarelto 20MG  tablets  [employer] Medicare Part D Building control surveyor Exception Request FormMedicare Part D

## 2019-09-21 ENCOUNTER — Telehealth (HOSPITAL_COMMUNITY): Payer: Self-pay | Admitting: Licensed Clinical Social Worker

## 2019-09-21 NOTE — Telephone Encounter (Signed)
CSW reached out to patient again to discuss applying for Extra Help- unable to reach on either line- left voicemail.  CSW will attempt to reach out again next week.  Jorge Ny, LCSW Clinical Social Worker [facility] Desk#: [phone] Cell#: [phone]

## 2019-09-21 NOTE — Telephone Encounter (Signed)
**Note De-Identified Koltan Portocarrero Obfuscation** Letter received from [employer] stating that that did deny the pts Xarelto tier exception. Reason:  Xarelto is already at a tier 3 which is the lowest tier possible for a name brand medication and cannot be lowered further per Part D rules.  I have sent a copy of the denial letter to Tammy Sours, CPhT as requested.

## 2019-09-24 ENCOUNTER — Telehealth (HOSPITAL_COMMUNITY): Payer: Self-pay | Admitting: Licensed Clinical Social Worker

## 2019-09-24 NOTE — Telephone Encounter (Signed)
CSW called pt to follow up with assisting him in applying for Extra Help program.  Pt reports he does not have everything he needs to apply at this time and has a lot happening this week with his wife's MD appt schedule and the passing of a family member.  CSW made sure pt has my direct phone number and requested he call me back when he has 30 minutes to apply- pt expressed understanding and will plan to follow up with CSW at a later date.  Jorge Ny, LCSW Clinical Social Worker [facility] Desk#: [phone] Cell#: [phone]

## 2019-10-25 ENCOUNTER — Ambulatory Visit: Payer: Medicare Other | Attending: Internal Medicine

## 2019-10-25 DIAGNOSIS — Z23 Encounter for immunization: Secondary | ICD-10-CM

## 2019-10-25 NOTE — Progress Notes (Signed)
   Covid-19 Vaccination Clinic  Name:  [name] [name]    MRN: [id number] DOB: 05-15-1946  10/25/2019  Mr. Wawrzyniak was observed post Covid-19 immunization for 15 minutes without incidence. He was provided with Vaccine Information Sheet and instruction to access the V-Safe system.   Mr. Poole was instructed to call 911 with any severe reactions post vaccine: Marland Kitchen Difficulty breathing  . Swelling of your face and throat  . A fast heartbeat  . A bad rash all over your body  . Dizziness and weakness    Immunizations Administered    Name Date Dose VIS Date Route   Pfizer COVID-19 Vaccine 10/25/2019  3:48 PM 0.3 mL 08/10/2019 Intramuscular   Manufacturer: [place]   Lot: [id number]   [place]: [id number]

## 2019-11-20 ENCOUNTER — Ambulatory Visit: Payer: Medicare Other | Attending: Internal Medicine

## 2019-11-20 DIAGNOSIS — Z23 Encounter for immunization: Secondary | ICD-10-CM

## 2019-11-20 NOTE — Progress Notes (Signed)
   Covid-19 Vaccination Clinic  Name:  [name] [name]    MRN: [id number] DOB: [birth year]  11/20/2019  Mr. [name] [name] was observed post Covid-19 immunization for 15 minutes without incident. He was provided with Vaccine Information Sheet and instruction to access the V-Safe system.   Mr. [name] [name] was instructed to call 911 with any severe reactions post vaccine: Marland Kitchen Difficulty breathing  . Swelling of face and throat  . A fast heartbeat  . A bad rash all over body  . Dizziness and weakness   Immunizations Administered    Name Date Dose VIS Date Route   Pfizer COVID-19 Vaccine 11/20/2019  3:20 PM 0.3 mL 08/10/2019 Intramuscular   Manufacturer: [place]   Lot: [id number]   [place]: [id number]

## 2020-03-07 ENCOUNTER — Telehealth: Payer: Self-pay | Admitting: Cardiovascular Disease

## 2020-03-07 NOTE — Telephone Encounter (Signed)
New Message   Patient is calling because he needing a new rx for his CPAP machine to be sent to [facility] [phone] you can ask for Kim.

## 2020-03-07 NOTE — Telephone Encounter (Signed)
Routed to sleep pool to assist

## 2020-04-15 NOTE — Telephone Encounter (Signed)
Called [facility] to verify that patient received supplies that he was calling about. She informed that patient received new supplies in July.

## 2020-07-28 ENCOUNTER — Other Ambulatory Visit: Payer: Self-pay | Admitting: Cardiology

## 2020-08-02 ENCOUNTER — Other Ambulatory Visit: Payer: Self-pay

## 2020-08-02 ENCOUNTER — Ambulatory Visit
Admission: EM | Admit: 2020-08-02 | Discharge: 2020-08-02 | Disposition: A | Discharge status: Home / Self Care | Payer: Medicare Other | Attending: Urgent Care | Admitting: Urgent Care

## 2020-08-02 DIAGNOSIS — L299 Pruritus, unspecified: Secondary | ICD-10-CM

## 2020-08-02 DIAGNOSIS — L500 Allergic urticaria: Secondary | ICD-10-CM | POA: Diagnosis not present

## 2020-08-02 MED ORDER — CETIRIZINE HCL 10 MG PO TABS: 10.0000 mg | ORAL_TABLET | Freq: Every day | ORAL | 0 refills | Status: DC

## 2020-08-02 MED ORDER — PREDNISONE 10 MG PO TABS: 30.0000 mg | ORAL_TABLET | Freq: Every day | ORAL | 0 refills | Status: DC

## 2020-08-02 MED ORDER — CETIRIZINE HCL 10 MG PO TABS: 10.0000 mg | ORAL_TABLET | Freq: Every day | ORAL | 0 refills | Status: AC

## 2020-08-02 NOTE — ED Triage Notes (Signed)
Patient states he ate fish last night and at about 0300 hrs he began to itch and develop a rash. Patient states it is better now but he would like to be checked. Pt is ao x 4 and ambulatory.

## 2020-08-02 NOTE — ED Provider Notes (Signed)
[facility]   MRN: [id number] DOB: 01/30/1946  Subjective:   [name] [name] is a 73 y.o. male presenting for acute onset of hives overnight.  Symptoms started after he ate fish last night.  Reports that he began to itch and then he saw the hives.  Has used Benadryl with some relief.  Denies oral or facial swelling, chest tightness, shortness of breath, nausea, vomiting, belly pain.  Patient takes Xarelto for paroxysmal atrial fibrillation.  Denies history of heart failure or diabetes.  Denies starting any new medications.  No current facility-administered medications for this encounter.  Current Outpatient Medications:  .  allopurinol (ZYLOPRIM) 300 MG tablet, Take 300 mg by mouth daily. , Disp: , Rfl:  .  amLODipine (NORVASC) 10 MG tablet, Take 10 mg by mouth daily., Disp: , Rfl:  .  colchicine 0.6 MG tablet, Take 0.6 mg by mouth daily as needed (for gout). , Disp: , Rfl:  .  Colesevelam HCl (WELCHOL) 3.75 g PACK, Take 3.75 g by mouth 3 (three) times a week. , Disp: , Rfl:  .  diphenhydrAMINE (BENADRYL) 25 mg capsule, Take 25 mg by mouth every 6 (six) hours as needed for itching., Disp: , Rfl:  .  docusate sodium (COLACE) 100 MG capsule, Take 1 capsule (100 mg total) by mouth every 12 (twelve) hours., Disp: 60 capsule, Rfl: 0 .  ezetimibe (ZETIA) 10 MG tablet, Take 10 mg by mouth daily., Disp: , Rfl: 3 .  gabapentin (NEURONTIN) 300 MG capsule, Take 300 mg by mouth 3 (three) times daily as needed., Disp: , Rfl:  .  hydrochlorothiazide (HYDRODIURIL) 12.5 MG tablet, Take 12.5 mg by mouth daily., Disp: , Rfl: 0 .  irbesartan (AVAPRO) 300 MG tablet, Take 300 mg by mouth every morning., Disp: , Rfl:  .  lubiprostone (AMITIZA) 24 MCG capsule, Take 24 mcg by mouth daily as needed for constipation., Disp: , Rfl:  .  methocarbamol (ROBAXIN) 500 MG tablet, Take 1 tablet (500 mg total) by mouth 3 (three) times daily between meals as needed., Disp: 20 tablet, Rfl: 0 .  metoprolol  succinate (TOPROL-XL) 100 MG 24 hr tablet, Take 100 mg by mouth daily., Disp: , Rfl:  .  Multiple Vitamin (MULTIVITAMIN WITH MINERALS) TABS tablet, Take 1 tablet by mouth 2 (two) times a week. , Disp: , Rfl:  .  olmesartan (BENICAR) 40 MG tablet, Take 40 mg by mouth daily., Disp: , Rfl:  .  promethazine (PHENERGAN) 25 MG tablet, Take 25 mg by mouth every 6 (six) hours as needed for nausea or vomiting., Disp: , Rfl:  .  rosuvastatin (CRESTOR) 5 MG tablet, Take 5 mg by mouth once a week., Disp: , Rfl:  .  tamsulosin (FLOMAX) 0.4 MG CAPS capsule, Take 0.4 mg by mouth daily., Disp: , Rfl:  .  traMADol (ULTRAM) 50 MG tablet, Take 50-100 mg by mouth every 6 (six) hours as needed for moderate pain. , Disp: , Rfl:  .  XARELTO 20 MG TABS tablet, TAKE 1 TABLET BY MOUTH EVERY DAY WITH SUPPER, Disp: 90 tablet, Rfl: 2   No Known Allergies  Past Medical History:  Diagnosis Date  . Arthritis   . Gout   . Heart murmur   . Hypertension      Past Surgical History:  Procedure Laterality Date  . HERNIA REPAIR    . RIGHT/LEFT HEART CATH AND CORONARY ANGIOGRAPHY N/A 02/10/2018   Procedure: RIGHT/LEFT HEART CATH AND CORONARY ANGIOGRAPHY;  Surgeon: Nelva Bush, MD;  Location: [facility];  Service: Cardiovascular;  Laterality: N/A;    Family History  Problem Relation Age of Onset  . Cancer Mother   . Hypertension Mother   . Heart disease Father   . Hypertension Father   . Hypertension Sister   . Hypertension Brother     Social History   Tobacco Use  . Smoking status: Never Smoker  . Smokeless tobacco: Never Used  Vaping Use  . Vaping Use: Never used  Substance Use Topics  . Alcohol use: No  . Drug use: No    ROS   Objective:   Vitals: BP 127/70 (BP Location: Right Arm)   Pulse (!) 55   Temp 98.2 F (36.8 C) (Oral)   Resp 20   SpO2 96%   Physical Exam Constitutional:      General: He is not in acute distress.    Appearance: Normal appearance. He is well-developed. He  is not ill-appearing, toxic-appearing or diaphoretic.  HENT:     Head: Normocephalic and atraumatic.     Right Ear: External ear normal.     Left Ear: External ear normal.     Nose: Nose normal.     Mouth/Throat:     Mouth: Mucous membranes are moist.     Pharynx: Oropharynx is clear.  Eyes:     General: No scleral icterus.    Extraocular Movements: Extraocular movements intact.     Pupils: Pupils are equal, round, and reactive to light.  Cardiovascular:     Rate and Rhythm: Normal rate and regular rhythm.     Heart sounds: Normal heart sounds. No murmur heard.  No friction rub. No gallop.   Pulmonary:     Effort: Pulmonary effort is normal. No respiratory distress.     Breath sounds: Normal breath sounds. No stridor. No wheezing, rhonchi or rales.  Skin:    Findings: Rash (Patches of urticaria scattered over his lower and posterior neck, and left side of his chest extending into the lateral chest wall over his mid back) present.  Neurological:     Mental Status: He is alert and oriented to person, place, and time.  Psychiatric:        Mood and Affect: Mood normal.        Behavior: Behavior normal.        Thought Content: Thought content normal.       Assessment and Plan :   PDMP not reviewed this encounter.  1. Allergic urticaria   2. Itching     Patient has had somewhat of a response to use of Benadryl.  We will augment this by using prednisone at 30 mg for 5 days.  At this time patient does not have any signs of an anaphylactic rash.  No contraindications to steroid use.  Recommended use of Zyrtec if Benadryl causes too much sedation. Counseled patient on potential for adverse effects with medications prescribed/recommended today, ER and return-to-clinic precautions discussed, patient verbalized understanding.    Jaynee Eagles, PA-C 08/02/20 1446

## 2020-08-27 ENCOUNTER — Other Ambulatory Visit: Payer: Self-pay | Admitting: Family Medicine

## 2020-08-27 DIAGNOSIS — R110 Nausea: Secondary | ICD-10-CM

## 2020-08-27 DIAGNOSIS — R109 Unspecified abdominal pain: Secondary | ICD-10-CM

## 2020-09-22 ENCOUNTER — Ambulatory Visit
Admission: RE | Admit: 2020-09-22 | Discharge: 2020-09-22 | Disposition: A | Discharge status: Home / Self Care | Payer: Medicare Other | Source: Ambulatory Visit | Attending: Family Medicine | Admitting: Family Medicine

## 2020-09-22 DIAGNOSIS — K219 Gastro-esophageal reflux disease without esophagitis: Secondary | ICD-10-CM | POA: Diagnosis not present

## 2020-09-22 DIAGNOSIS — I4891 Unspecified atrial fibrillation: Secondary | ICD-10-CM | POA: Diagnosis not present

## 2020-09-22 DIAGNOSIS — R110 Nausea: Secondary | ICD-10-CM | POA: Diagnosis not present

## 2020-09-22 DIAGNOSIS — R109 Unspecified abdominal pain: Secondary | ICD-10-CM

## 2020-09-22 DIAGNOSIS — K5730 Diverticulosis of large intestine without perforation or abscess without bleeding: Secondary | ICD-10-CM | POA: Diagnosis not present

## 2020-09-22 DIAGNOSIS — E7800 Pure hypercholesterolemia, unspecified: Secondary | ICD-10-CM | POA: Diagnosis not present

## 2020-09-22 DIAGNOSIS — R918 Other nonspecific abnormal finding of lung field: Secondary | ICD-10-CM | POA: Diagnosis not present

## 2020-09-22 DIAGNOSIS — I700 Atherosclerosis of aorta: Secondary | ICD-10-CM | POA: Diagnosis not present

## 2020-09-22 DIAGNOSIS — I480 Paroxysmal atrial fibrillation: Secondary | ICD-10-CM | POA: Diagnosis not present

## 2020-09-22 DIAGNOSIS — R911 Solitary pulmonary nodule: Secondary | ICD-10-CM | POA: Diagnosis not present

## 2020-09-22 DIAGNOSIS — K3189 Other diseases of stomach and duodenum: Secondary | ICD-10-CM | POA: Diagnosis not present

## 2020-09-22 DIAGNOSIS — I129 Hypertensive chronic kidney disease with stage 1 through stage 4 chronic kidney disease, or unspecified chronic kidney disease: Secondary | ICD-10-CM | POA: Diagnosis not present

## 2020-09-22 DIAGNOSIS — I10 Essential (primary) hypertension: Secondary | ICD-10-CM | POA: Diagnosis not present

## 2020-09-22 DIAGNOSIS — N183 Chronic kidney disease, stage 3 unspecified: Secondary | ICD-10-CM | POA: Diagnosis not present

## 2020-09-22 DIAGNOSIS — N281 Cyst of kidney, acquired: Secondary | ICD-10-CM | POA: Diagnosis not present

## 2020-09-22 MED ORDER — IOPAMIDOL (ISOVUE-300) INJECTION 61%
100.0000 mL | Freq: Once | INTRAVENOUS | Status: AC | PRN
  Administered 2020-09-22: 100 mL via INTRAVENOUS

## 2020-10-06 DIAGNOSIS — K625 Hemorrhage of anus and rectum: Secondary | ICD-10-CM | POA: Diagnosis not present

## 2020-10-06 DIAGNOSIS — K5900 Constipation, unspecified: Secondary | ICD-10-CM | POA: Diagnosis not present

## 2020-10-17 DIAGNOSIS — K219 Gastro-esophageal reflux disease without esophagitis: Secondary | ICD-10-CM | POA: Diagnosis not present

## 2020-10-17 DIAGNOSIS — I10 Essential (primary) hypertension: Secondary | ICD-10-CM | POA: Diagnosis not present

## 2020-10-17 DIAGNOSIS — I480 Paroxysmal atrial fibrillation: Secondary | ICD-10-CM | POA: Diagnosis not present

## 2020-10-17 DIAGNOSIS — I4891 Unspecified atrial fibrillation: Secondary | ICD-10-CM | POA: Diagnosis not present

## 2020-10-17 DIAGNOSIS — N183 Chronic kidney disease, stage 3 unspecified: Secondary | ICD-10-CM | POA: Diagnosis not present

## 2020-10-17 DIAGNOSIS — E7800 Pure hypercholesterolemia, unspecified: Secondary | ICD-10-CM | POA: Diagnosis not present

## 2020-10-17 DIAGNOSIS — I129 Hypertensive chronic kidney disease with stage 1 through stage 4 chronic kidney disease, or unspecified chronic kidney disease: Secondary | ICD-10-CM | POA: Diagnosis not present

## 2020-11-17 DIAGNOSIS — M109 Gout, unspecified: Secondary | ICD-10-CM | POA: Diagnosis not present

## 2020-11-17 DIAGNOSIS — I10 Essential (primary) hypertension: Secondary | ICD-10-CM | POA: Diagnosis not present

## 2020-11-17 DIAGNOSIS — I480 Paroxysmal atrial fibrillation: Secondary | ICD-10-CM | POA: Diagnosis not present

## 2020-11-17 DIAGNOSIS — K644 Residual hemorrhoidal skin tags: Secondary | ICD-10-CM | POA: Diagnosis not present

## 2020-11-17 DIAGNOSIS — N1831 Chronic kidney disease, stage 3a: Secondary | ICD-10-CM | POA: Diagnosis not present

## 2020-11-17 DIAGNOSIS — I129 Hypertensive chronic kidney disease with stage 1 through stage 4 chronic kidney disease, or unspecified chronic kidney disease: Secondary | ICD-10-CM | POA: Diagnosis not present

## 2020-11-17 DIAGNOSIS — B0229 Other postherpetic nervous system involvement: Secondary | ICD-10-CM | POA: Diagnosis not present

## 2020-11-17 DIAGNOSIS — K5900 Constipation, unspecified: Secondary | ICD-10-CM | POA: Diagnosis not present

## 2020-11-17 DIAGNOSIS — K219 Gastro-esophageal reflux disease without esophagitis: Secondary | ICD-10-CM | POA: Diagnosis not present

## 2020-11-17 DIAGNOSIS — E7800 Pure hypercholesterolemia, unspecified: Secondary | ICD-10-CM | POA: Diagnosis not present

## 2020-11-17 DIAGNOSIS — M545 Low back pain, unspecified: Secondary | ICD-10-CM | POA: Diagnosis not present

## 2020-12-11 DIAGNOSIS — N1832 Chronic kidney disease, stage 3b: Secondary | ICD-10-CM | POA: Diagnosis not present

## 2020-12-12 DIAGNOSIS — I480 Paroxysmal atrial fibrillation: Secondary | ICD-10-CM | POA: Diagnosis not present

## 2020-12-12 DIAGNOSIS — K219 Gastro-esophageal reflux disease without esophagitis: Secondary | ICD-10-CM | POA: Diagnosis not present

## 2020-12-12 DIAGNOSIS — E7800 Pure hypercholesterolemia, unspecified: Secondary | ICD-10-CM | POA: Diagnosis not present

## 2020-12-12 DIAGNOSIS — I10 Essential (primary) hypertension: Secondary | ICD-10-CM | POA: Diagnosis not present

## 2020-12-12 DIAGNOSIS — N183 Chronic kidney disease, stage 3 unspecified: Secondary | ICD-10-CM | POA: Diagnosis not present

## 2020-12-12 DIAGNOSIS — I4891 Unspecified atrial fibrillation: Secondary | ICD-10-CM | POA: Diagnosis not present

## 2020-12-12 DIAGNOSIS — I129 Hypertensive chronic kidney disease with stage 1 through stage 4 chronic kidney disease, or unspecified chronic kidney disease: Secondary | ICD-10-CM | POA: Diagnosis not present

## 2020-12-14 DIAGNOSIS — N481 Balanitis: Secondary | ICD-10-CM | POA: Insufficient documentation

## 2021-01-15 DIAGNOSIS — I10 Essential (primary) hypertension: Secondary | ICD-10-CM | POA: Diagnosis not present

## 2021-01-15 DIAGNOSIS — K219 Gastro-esophageal reflux disease without esophagitis: Secondary | ICD-10-CM | POA: Diagnosis not present

## 2021-01-15 DIAGNOSIS — I480 Paroxysmal atrial fibrillation: Secondary | ICD-10-CM | POA: Diagnosis not present

## 2021-01-15 DIAGNOSIS — I129 Hypertensive chronic kidney disease with stage 1 through stage 4 chronic kidney disease, or unspecified chronic kidney disease: Secondary | ICD-10-CM | POA: Diagnosis not present

## 2021-01-15 DIAGNOSIS — E7800 Pure hypercholesterolemia, unspecified: Secondary | ICD-10-CM | POA: Diagnosis not present

## 2021-01-16 DIAGNOSIS — N1832 Chronic kidney disease, stage 3b: Secondary | ICD-10-CM | POA: Diagnosis not present

## 2021-01-30 DIAGNOSIS — K625 Hemorrhage of anus and rectum: Secondary | ICD-10-CM | POA: Diagnosis not present

## 2021-02-18 DIAGNOSIS — I4891 Unspecified atrial fibrillation: Secondary | ICD-10-CM | POA: Diagnosis not present

## 2021-02-18 DIAGNOSIS — I480 Paroxysmal atrial fibrillation: Secondary | ICD-10-CM | POA: Diagnosis not present

## 2021-02-18 DIAGNOSIS — E7800 Pure hypercholesterolemia, unspecified: Secondary | ICD-10-CM | POA: Diagnosis not present

## 2021-02-18 DIAGNOSIS — I129 Hypertensive chronic kidney disease with stage 1 through stage 4 chronic kidney disease, or unspecified chronic kidney disease: Secondary | ICD-10-CM | POA: Diagnosis not present

## 2021-02-18 DIAGNOSIS — I10 Essential (primary) hypertension: Secondary | ICD-10-CM | POA: Diagnosis not present

## 2021-02-18 DIAGNOSIS — N1832 Chronic kidney disease, stage 3b: Secondary | ICD-10-CM | POA: Diagnosis not present

## 2021-02-18 DIAGNOSIS — K219 Gastro-esophageal reflux disease without esophagitis: Secondary | ICD-10-CM | POA: Diagnosis not present

## 2021-02-20 ENCOUNTER — Telehealth: Payer: Self-pay | Admitting: *Deleted

## 2021-02-20 DIAGNOSIS — Z86010 Personal history of colonic polyps: Secondary | ICD-10-CM | POA: Diagnosis not present

## 2021-02-20 DIAGNOSIS — R140 Abdominal distension (gaseous): Secondary | ICD-10-CM | POA: Diagnosis not present

## 2021-02-20 DIAGNOSIS — K5900 Constipation, unspecified: Secondary | ICD-10-CM | POA: Diagnosis not present

## 2021-02-20 DIAGNOSIS — K625 Hemorrhage of anus and rectum: Secondary | ICD-10-CM | POA: Diagnosis not present

## 2021-02-20 DIAGNOSIS — R1013 Epigastric pain: Secondary | ICD-10-CM | POA: Diagnosis not present

## 2021-02-20 NOTE — Telephone Encounter (Signed)
Patient with diagnosis of afib on Xarelo for anticoagulation.    Procedure: colonoscopy Date of procedure: 03/23/21  CHA2DS2-VASc Score = 6  This indicates a 9.7% annual risk of stroke. The patient's score is based upon: CHF History: Yes HTN History: Yes Diabetes History: No Stroke History: Yes Vascular Disease History: Yes Age Score: 1 Gender Score: 0    CrCl 43.7 ml/min  Per office protocol, patient can hold Xarelto for 1 day prior to procedure due to hx of TIAs. MD is requesting a 3 day hold. Will have to defer to Dr. Marlou Porch.

## 2021-02-20 NOTE — Telephone Encounter (Signed)
   [facility] Pre-operative Risk Assessment    Patient Name: [name] [name]  DOB: [birth year]  MRN: [id number]   HEARTCARE STAFF: - Please ensure there is not already an duplicate clearance open for this procedure. - Under Visit Info/Reason for Call, type in Other and utilize the format Clearance MM/DD/YY or Clearance TBD. Do not use dashes or single digits. - If request is for dental extraction, please clarify the # of teeth to be extracted. - If the patient is currently at the dentist's office, call Pre-Op APP to address. If the patient is not currently in the dentist office, please route to the Pre-Op pool  Request for surgical clearance:  What type of surgery is being performed? COLONOSCOPY   When is this surgery scheduled? 03/23/21   What type of clearance is required (medical clearance vs. Pharmacy clearance to hold med vs. Both)? BOTH  Are there any medications that need to be held prior to surgery and how long? XARELTO x 3 DAYS PRIOR TO PROCEDURE   Practice name and name of physician performing surgery? [facility]; DR. Alessandra Bevels   What is the office phone number? [phone]   7.   What is the office fax number? [phone]  8.   Anesthesia type (None, local, MAC, general) ? PROPOFOL    Julaine Hua 02/20/2021, 3:57 PM  _________________________________________________________________   (provider comments below)

## 2021-02-23 NOTE — Telephone Encounter (Signed)
Pt is medically cleared - can complete 4.0 METS without angina.   Awaiting Dr. Marlou Porch for final recommendations for xarelto hold.

## 2021-03-04 NOTE — Telephone Encounter (Signed)
Chart reviewed as part of pre-op protocol. As below, just waiting for Dr. Marlou Porch input on length of Xarelto hold (see pharmD note below). Dr. Marlou Porch - - Please route response to [facility] PREOP (the pre-op pool). Thank you.

## 2021-03-05 NOTE — Telephone Encounter (Signed)
   Patient Name: [name] [name]  DOB: 20-Dec-1945  MRN: [id number]   Primary Cardiologist: Candee Furbish, MD  Chart reviewed as part of pre-operative protocol coverage. Pre-op clearance already addressed by colleagues in earlier phone notes. To summarize recommendations:  - Per Fabian Sharp PA-C patient is medically cleared for procedure. - Request came through to hold Xarelto for 3 days prior to colonoscopy. Per pharmacist review, would typically hold Xarelto 1 day prior due to history of TIAs. However, pharmD reviewed with Dr. Marlou Porch and in the event that GI feels they need to hold for 3 days, they have permission from MD to do so.  Will route this bundled recommendation to requesting provider via Epic fax function and remove from pre-op pool. Please call with questions.  [name], PA-C 03/05/2021, 8:08 AM

## 2021-03-18 DIAGNOSIS — I10 Essential (primary) hypertension: Secondary | ICD-10-CM | POA: Diagnosis not present

## 2021-03-18 DIAGNOSIS — K219 Gastro-esophageal reflux disease without esophagitis: Secondary | ICD-10-CM | POA: Diagnosis not present

## 2021-03-18 DIAGNOSIS — E7800 Pure hypercholesterolemia, unspecified: Secondary | ICD-10-CM | POA: Diagnosis not present

## 2021-03-18 DIAGNOSIS — I480 Paroxysmal atrial fibrillation: Secondary | ICD-10-CM | POA: Diagnosis not present

## 2021-03-18 DIAGNOSIS — I4891 Unspecified atrial fibrillation: Secondary | ICD-10-CM | POA: Diagnosis not present

## 2021-03-18 DIAGNOSIS — N183 Chronic kidney disease, stage 3 unspecified: Secondary | ICD-10-CM | POA: Diagnosis not present

## 2021-03-18 DIAGNOSIS — I129 Hypertensive chronic kidney disease with stage 1 through stage 4 chronic kidney disease, or unspecified chronic kidney disease: Secondary | ICD-10-CM | POA: Diagnosis not present

## 2021-03-23 DIAGNOSIS — K625 Hemorrhage of anus and rectum: Secondary | ICD-10-CM | POA: Diagnosis not present

## 2021-03-23 DIAGNOSIS — K5730 Diverticulosis of large intestine without perforation or abscess without bleeding: Secondary | ICD-10-CM | POA: Diagnosis not present

## 2021-03-23 DIAGNOSIS — D124 Benign neoplasm of descending colon: Secondary | ICD-10-CM | POA: Diagnosis not present

## 2021-03-23 DIAGNOSIS — D122 Benign neoplasm of ascending colon: Secondary | ICD-10-CM | POA: Diagnosis not present

## 2021-03-23 DIAGNOSIS — D123 Benign neoplasm of transverse colon: Secondary | ICD-10-CM | POA: Diagnosis not present

## 2021-03-23 DIAGNOSIS — K644 Residual hemorrhoidal skin tags: Secondary | ICD-10-CM | POA: Diagnosis not present

## 2021-03-23 DIAGNOSIS — K648 Other hemorrhoids: Secondary | ICD-10-CM | POA: Diagnosis not present

## 2021-03-23 DIAGNOSIS — D125 Benign neoplasm of sigmoid colon: Secondary | ICD-10-CM | POA: Diagnosis not present

## 2021-03-25 DIAGNOSIS — G4733 Obstructive sleep apnea (adult) (pediatric): Secondary | ICD-10-CM | POA: Diagnosis not present

## 2021-03-26 DIAGNOSIS — D122 Benign neoplasm of ascending colon: Secondary | ICD-10-CM | POA: Diagnosis not present

## 2021-03-26 DIAGNOSIS — D123 Benign neoplasm of transverse colon: Secondary | ICD-10-CM | POA: Diagnosis not present

## 2021-03-26 DIAGNOSIS — D124 Benign neoplasm of descending colon: Secondary | ICD-10-CM | POA: Diagnosis not present

## 2021-03-26 DIAGNOSIS — D125 Benign neoplasm of sigmoid colon: Secondary | ICD-10-CM | POA: Diagnosis not present

## 2021-04-10 DIAGNOSIS — Z20822 Contact with and (suspected) exposure to covid-19: Secondary | ICD-10-CM | POA: Diagnosis not present

## 2021-04-13 DIAGNOSIS — D6869 Other thrombophilia: Secondary | ICD-10-CM | POA: Diagnosis not present

## 2021-04-13 DIAGNOSIS — U071 COVID-19: Secondary | ICD-10-CM | POA: Diagnosis not present

## 2021-04-13 DIAGNOSIS — N1832 Chronic kidney disease, stage 3b: Secondary | ICD-10-CM | POA: Diagnosis not present

## 2021-04-13 DIAGNOSIS — I480 Paroxysmal atrial fibrillation: Secondary | ICD-10-CM | POA: Diagnosis not present

## 2021-04-21 DIAGNOSIS — Z20822 Contact with and (suspected) exposure to covid-19: Secondary | ICD-10-CM | POA: Diagnosis not present

## 2021-04-27 DIAGNOSIS — I480 Paroxysmal atrial fibrillation: Secondary | ICD-10-CM | POA: Diagnosis not present

## 2021-04-27 DIAGNOSIS — M1000 Idiopathic gout, unspecified site: Secondary | ICD-10-CM | POA: Diagnosis not present

## 2021-04-27 DIAGNOSIS — I129 Hypertensive chronic kidney disease with stage 1 through stage 4 chronic kidney disease, or unspecified chronic kidney disease: Secondary | ICD-10-CM | POA: Diagnosis not present

## 2021-04-27 DIAGNOSIS — K219 Gastro-esophageal reflux disease without esophagitis: Secondary | ICD-10-CM | POA: Diagnosis not present

## 2021-04-27 DIAGNOSIS — E785 Hyperlipidemia, unspecified: Secondary | ICD-10-CM | POA: Diagnosis not present

## 2021-04-27 DIAGNOSIS — N1832 Chronic kidney disease, stage 3b: Secondary | ICD-10-CM | POA: Diagnosis not present

## 2021-06-06 ENCOUNTER — Other Ambulatory Visit: Payer: Self-pay | Admitting: Cardiology

## 2021-06-09 DIAGNOSIS — I480 Paroxysmal atrial fibrillation: Secondary | ICD-10-CM | POA: Diagnosis not present

## 2021-06-09 DIAGNOSIS — K219 Gastro-esophageal reflux disease without esophagitis: Secondary | ICD-10-CM | POA: Diagnosis not present

## 2021-06-09 DIAGNOSIS — I129 Hypertensive chronic kidney disease with stage 1 through stage 4 chronic kidney disease, or unspecified chronic kidney disease: Secondary | ICD-10-CM | POA: Diagnosis not present

## 2021-06-09 DIAGNOSIS — N1831 Chronic kidney disease, stage 3a: Secondary | ICD-10-CM | POA: Diagnosis not present

## 2021-06-09 DIAGNOSIS — I10 Essential (primary) hypertension: Secondary | ICD-10-CM | POA: Diagnosis not present

## 2021-06-09 DIAGNOSIS — E7800 Pure hypercholesterolemia, unspecified: Secondary | ICD-10-CM | POA: Diagnosis not present

## 2021-06-17 DIAGNOSIS — K5900 Constipation, unspecified: Secondary | ICD-10-CM | POA: Diagnosis not present

## 2021-06-17 DIAGNOSIS — I129 Hypertensive chronic kidney disease with stage 1 through stage 4 chronic kidney disease, or unspecified chronic kidney disease: Secondary | ICD-10-CM | POA: Diagnosis not present

## 2021-06-17 DIAGNOSIS — E7800 Pure hypercholesterolemia, unspecified: Secondary | ICD-10-CM | POA: Diagnosis not present

## 2021-06-17 DIAGNOSIS — I420 Dilated cardiomyopathy: Secondary | ICD-10-CM | POA: Diagnosis not present

## 2021-06-17 DIAGNOSIS — Z0000 Encounter for general adult medical examination without abnormal findings: Secondary | ICD-10-CM | POA: Diagnosis not present

## 2021-06-17 DIAGNOSIS — N1831 Chronic kidney disease, stage 3a: Secondary | ICD-10-CM | POA: Diagnosis not present

## 2021-06-17 DIAGNOSIS — M109 Gout, unspecified: Secondary | ICD-10-CM | POA: Diagnosis not present

## 2021-06-17 DIAGNOSIS — I480 Paroxysmal atrial fibrillation: Secondary | ICD-10-CM | POA: Diagnosis not present

## 2021-06-17 DIAGNOSIS — Z1389 Encounter for screening for other disorder: Secondary | ICD-10-CM | POA: Diagnosis not present

## 2021-06-17 DIAGNOSIS — B0229 Other postherpetic nervous system involvement: Secondary | ICD-10-CM | POA: Diagnosis not present

## 2021-07-02 DIAGNOSIS — Z23 Encounter for immunization: Secondary | ICD-10-CM | POA: Diagnosis not present

## 2021-07-16 DIAGNOSIS — R1032 Left lower quadrant pain: Secondary | ICD-10-CM | POA: Diagnosis not present

## 2021-07-16 DIAGNOSIS — K219 Gastro-esophageal reflux disease without esophagitis: Secondary | ICD-10-CM | POA: Diagnosis not present

## 2021-07-16 DIAGNOSIS — R140 Abdominal distension (gaseous): Secondary | ICD-10-CM | POA: Diagnosis not present

## 2021-07-16 DIAGNOSIS — K648 Other hemorrhoids: Secondary | ICD-10-CM | POA: Diagnosis not present

## 2021-07-16 DIAGNOSIS — Z86010 Personal history of colonic polyps: Secondary | ICD-10-CM | POA: Diagnosis not present

## 2021-07-17 ENCOUNTER — Ambulatory Visit
Admission: RE | Admit: 2021-07-17 | Discharge: 2021-07-17 | Disposition: A | Discharge status: Home / Self Care | Payer: Medicare Other | Source: Ambulatory Visit | Attending: Gastroenterology | Admitting: Gastroenterology

## 2021-07-17 ENCOUNTER — Other Ambulatory Visit: Payer: Self-pay | Admitting: Gastroenterology

## 2021-07-17 DIAGNOSIS — I878 Other specified disorders of veins: Secondary | ICD-10-CM | POA: Diagnosis not present

## 2021-07-17 DIAGNOSIS — R140 Abdominal distension (gaseous): Secondary | ICD-10-CM | POA: Diagnosis not present

## 2021-07-17 DIAGNOSIS — R1032 Left lower quadrant pain: Secondary | ICD-10-CM

## 2021-07-17 DIAGNOSIS — R109 Unspecified abdominal pain: Secondary | ICD-10-CM | POA: Diagnosis not present

## 2021-07-17 DIAGNOSIS — M160 Bilateral primary osteoarthritis of hip: Secondary | ICD-10-CM | POA: Diagnosis not present

## 2021-08-04 DIAGNOSIS — J019 Acute sinusitis, unspecified: Secondary | ICD-10-CM | POA: Diagnosis not present

## 2021-09-07 DIAGNOSIS — E7800 Pure hypercholesterolemia, unspecified: Secondary | ICD-10-CM | POA: Diagnosis not present

## 2021-09-07 DIAGNOSIS — I480 Paroxysmal atrial fibrillation: Secondary | ICD-10-CM | POA: Diagnosis not present

## 2021-09-07 DIAGNOSIS — I129 Hypertensive chronic kidney disease with stage 1 through stage 4 chronic kidney disease, or unspecified chronic kidney disease: Secondary | ICD-10-CM | POA: Diagnosis not present

## 2021-09-07 DIAGNOSIS — I10 Essential (primary) hypertension: Secondary | ICD-10-CM | POA: Diagnosis not present

## 2021-09-07 DIAGNOSIS — N1831 Chronic kidney disease, stage 3a: Secondary | ICD-10-CM | POA: Diagnosis not present

## 2021-10-06 DIAGNOSIS — K219 Gastro-esophageal reflux disease without esophagitis: Secondary | ICD-10-CM | POA: Diagnosis not present

## 2021-10-06 DIAGNOSIS — E7800 Pure hypercholesterolemia, unspecified: Secondary | ICD-10-CM | POA: Diagnosis not present

## 2021-10-06 DIAGNOSIS — N1832 Chronic kidney disease, stage 3b: Secondary | ICD-10-CM | POA: Diagnosis not present

## 2021-10-06 DIAGNOSIS — I129 Hypertensive chronic kidney disease with stage 1 through stage 4 chronic kidney disease, or unspecified chronic kidney disease: Secondary | ICD-10-CM | POA: Diagnosis not present

## 2021-10-06 DIAGNOSIS — I4891 Unspecified atrial fibrillation: Secondary | ICD-10-CM | POA: Diagnosis not present

## 2021-10-06 DIAGNOSIS — N1831 Chronic kidney disease, stage 3a: Secondary | ICD-10-CM | POA: Diagnosis not present

## 2021-10-08 ENCOUNTER — Other Ambulatory Visit: Payer: Self-pay | Admitting: Family Medicine

## 2021-10-08 ENCOUNTER — Ambulatory Visit
Admission: RE | Admit: 2021-10-08 | Discharge: 2021-10-08 | Disposition: A | Discharge status: Home / Self Care | Payer: Medicare Other | Source: Ambulatory Visit | Attending: Family Medicine | Admitting: Family Medicine

## 2021-10-08 DIAGNOSIS — R0781 Pleurodynia: Secondary | ICD-10-CM

## 2021-10-08 DIAGNOSIS — J329 Chronic sinusitis, unspecified: Secondary | ICD-10-CM | POA: Diagnosis not present

## 2021-10-22 DIAGNOSIS — R109 Unspecified abdominal pain: Secondary | ICD-10-CM | POA: Diagnosis not present

## 2021-10-22 DIAGNOSIS — Z8709 Personal history of other diseases of the respiratory system: Secondary | ICD-10-CM | POA: Diagnosis not present

## 2021-10-22 DIAGNOSIS — K5900 Constipation, unspecified: Secondary | ICD-10-CM | POA: Diagnosis not present

## 2021-10-26 DIAGNOSIS — N1832 Chronic kidney disease, stage 3b: Secondary | ICD-10-CM | POA: Diagnosis not present

## 2021-10-30 ENCOUNTER — Telehealth: Payer: Self-pay | Admitting: *Deleted

## 2021-10-30 DIAGNOSIS — R1012 Left upper quadrant pain: Secondary | ICD-10-CM | POA: Diagnosis not present

## 2021-10-30 NOTE — Telephone Encounter (Signed)
Received clearance request, pt hadn't been seen since 2021.  Called pt and he has been scheduled to see Dr. Marlou Porch, 11/03/21. ? ? ? ?Pre-operative Risk Assessment  ?  ?Patient Name: [name] [name]  ?DOB: 10-Aug-1946 ?MRN: [id number]  ? ?  ? ?Request for Surgical Clearance   ? ?Procedure:   ENDOSCOPY ? ?Date of Surgery:  Clearance 12/01/21                              ?   ?Surgeon:  DR. Alessandra Bevels ?Surgeon's Group or Practice Name:  [facility] ?Phone number:  [phone] ?Fax number:  [phone] ?  ?Type of Clearance Requested:   ?- Medical  ?- Pharmacy:  Hold Rivaroxaban (Xarelto) X'S 3 DAYS ?  ?Type of Anesthesia:   PROPOFOL ?  ?Additional requests/questions:   ? ?Signed, ?Jeanann Lewandowsky   ?10/30/2021, 1:56 PM  ? ?

## 2021-11-01 ENCOUNTER — Other Ambulatory Visit: Payer: Self-pay

## 2021-11-01 ENCOUNTER — Encounter (HOSPITAL_COMMUNITY): Payer: Self-pay | Admitting: Emergency Medicine

## 2021-11-01 ENCOUNTER — Emergency Department (HOSPITAL_COMMUNITY): Payer: Medicare Other

## 2021-11-01 ENCOUNTER — Emergency Department (HOSPITAL_COMMUNITY)
Admission: EM | Admit: 2021-11-01 | Discharge: 2021-11-01 | Disposition: A | Discharge status: Home / Self Care | Payer: Medicare Other | Attending: Emergency Medicine | Admitting: Emergency Medicine

## 2021-11-01 DIAGNOSIS — Z79899 Other long term (current) drug therapy: Secondary | ICD-10-CM | POA: Insufficient documentation

## 2021-11-01 DIAGNOSIS — K4090 Unilateral inguinal hernia, without obstruction or gangrene, not specified as recurrent: Secondary | ICD-10-CM

## 2021-11-01 DIAGNOSIS — Z7901 Long term (current) use of anticoagulants: Secondary | ICD-10-CM | POA: Insufficient documentation

## 2021-11-01 DIAGNOSIS — N289 Disorder of kidney and ureter, unspecified: Secondary | ICD-10-CM | POA: Diagnosis not present

## 2021-11-01 DIAGNOSIS — I10 Essential (primary) hypertension: Secondary | ICD-10-CM | POA: Insufficient documentation

## 2021-11-01 DIAGNOSIS — N281 Cyst of kidney, acquired: Secondary | ICD-10-CM | POA: Diagnosis not present

## 2021-11-01 DIAGNOSIS — K5732 Diverticulitis of large intestine without perforation or abscess without bleeding: Secondary | ICD-10-CM | POA: Diagnosis not present

## 2021-11-01 DIAGNOSIS — K5792 Diverticulitis of intestine, part unspecified, without perforation or abscess without bleeding: Secondary | ICD-10-CM | POA: Diagnosis not present

## 2021-11-01 DIAGNOSIS — R1032 Left lower quadrant pain: Secondary | ICD-10-CM | POA: Diagnosis present

## 2021-11-01 DIAGNOSIS — K5730 Diverticulosis of large intestine without perforation or abscess without bleeding: Secondary | ICD-10-CM | POA: Diagnosis not present

## 2021-11-01 LAB — LIPASE, BLOOD: Lipase: 38 U/L (ref 11–51)

## 2021-11-01 LAB — COMPREHENSIVE METABOLIC PANEL
ALT: 11 U/L (ref 0–44)
AST: 21 U/L (ref 15–41)
Albumin: 3.7 g/dL (ref 3.5–5.0)
Alkaline Phosphatase: 62 U/L (ref 38–126)
Anion gap: 8 (ref 5–15)
BUN: 18 mg/dL (ref 8–23)
CO2: 26 mmol/L (ref 22–32)
Calcium: 9 mg/dL (ref 8.9–10.3)
Chloride: 101 mmol/L (ref 98–111)
Creatinine, Ser: 1.72 mg/dL — ABNORMAL HIGH (ref 0.61–1.24)
GFR, Estimated: 41 mL/min — ABNORMAL LOW (ref >60)
Glucose, Bld: 103 mg/dL — ABNORMAL HIGH (ref 70–99)
Potassium: 3.7 mmol/L (ref 3.5–5.1)
Sodium: 135 mmol/L (ref 135–145)
Total Bilirubin: 0.4 mg/dL (ref 0.3–1.2)
Total Protein: 7.4 g/dL (ref 6.5–8.1)

## 2021-11-01 LAB — CBC
HCT: 40.4 % (ref 39.0–52.0)
Hemoglobin: 13.2 g/dL (ref 13.0–17.0)
MCH: 30.1 pg (ref 26.0–34.0)
MCHC: 32.7 g/dL (ref 30.0–36.0)
MCV: 92 fL (ref 80.0–100.0)
Platelets: 247 10*3/uL (ref 150–400)
RBC: 4.39 MIL/uL (ref 4.22–5.81)
RDW: 13.8 % (ref 11.5–15.5)
WBC: 6.5 10*3/uL (ref 4.0–10.5)
nRBC: 0 % (ref 0.0–0.2)

## 2021-11-01 LAB — URINALYSIS, ROUTINE W REFLEX MICROSCOPIC
Bilirubin Urine: NEGATIVE
Glucose, UA: NEGATIVE mg/dL
Hgb urine dipstick: NEGATIVE
Ketones, ur: NEGATIVE mg/dL
Leukocytes,Ua: NEGATIVE
Nitrite: NEGATIVE
Protein, ur: NEGATIVE mg/dL
Specific Gravity, Urine: 1.011 (ref 1.005–1.030)
pH: 5 (ref 5.0–8.0)

## 2021-11-01 MED ORDER — HYDROCODONE-ACETAMINOPHEN 5-325 MG PO TABS: 1.0000 | ORAL_TABLET | Freq: Four times a day (QID) | ORAL | 0 refills | Status: DC | PRN

## 2021-11-01 MED ORDER — AMOXICILLIN-POT CLAVULANATE 875-125 MG PO TABS
1.0000 | ORAL_TABLET | Freq: Two times a day (BID) | ORAL | 0 refills | Status: DC
Start: 2021-11-01 — End: 2022-01-06

## 2021-11-01 MED ORDER — AMOXICILLIN-POT CLAVULANATE 875-125 MG PO TABS
1.0000 | ORAL_TABLET | Freq: Once | ORAL | Status: AC
  Administered 2021-11-01: 1 via ORAL
  Filled 2021-11-01: qty 1

## 2021-11-01 MED ORDER — IOHEXOL 300 MG/ML  SOLN
100.0000 mL | Freq: Once | INTRAMUSCULAR | Status: AC | PRN
  Administered 2021-11-01: 100 mL via INTRAVENOUS

## 2021-11-01 MED ORDER — LACTATED RINGERS IV BOLUS
1000.0000 mL | Freq: Once | INTRAVENOUS | Status: AC
  Administered 2021-11-01: 1000 mL via INTRAVENOUS

## 2021-11-01 MED ORDER — FENTANYL CITRATE PF 50 MCG/ML IJ SOSY
100.0000 ug | PREFILLED_SYRINGE | Freq: Once | INTRAMUSCULAR | Status: AC
  Administered 2021-11-01: 100 ug via INTRAVENOUS
  Filled 2021-11-01: qty 2

## 2021-11-01 NOTE — ED Triage Notes (Signed)
Pt reports lower left quad abdominal pain.  Pt did take several medications however no bowel movement.  Last bowel movement was last Friday.  Does have a scope scheduled for April, but pain is too much. ?

## 2021-11-01 NOTE — ED Notes (Signed)
Patient transported to CT 

## 2021-11-01 NOTE — ED Provider Notes (Signed)
[facility] EMERGENCY DEPARTMENT Provider Note   CSN: [id number] Arrival date & time: 11/01/21  0400     History  Chief Complaint  Patient presents with   Abdominal Pain    [name] [name] is a 76 y.o. male.  The history is provided by the patient and a relative.  Abdominal Pain Pain location:  LLQ Pain quality: aching   Pain radiates to:  Does not radiate Pain severity:  Moderate Onset quality:  Gradual Duration:  12 hours Timing:  Constant Progression:  Worsening Chronicity:  New Relieved by:  Nothing Worsened by:  Palpation and movement Associated symptoms: constipation   Associated symptoms: no chest pain, no diarrhea, no dysuria, no fever, no hematochezia, no melena, no nausea, no shortness of breath and no vomiting   Risk factors: being elderly   Patient reports is been having left lower quadrant abdominal pain for the past 12 hours.  No fevers or vomiting.  He was seen recently for constipation and has been on meds including MiraLAX but no bowel movement in over 24 hours He is on anticoagulation for atrial fibrillation   Past Medical History:  Diagnosis Date   Arthritis    Gout    Heart murmur    Hypertension     Home Medications Prior to Admission medications   Medication Sig Start Date End Date Taking? Authorizing Provider  allopurinol (ZYLOPRIM) 300 MG tablet Take 300 mg by mouth daily.   Yes [provider]  amLODipine (NORVASC) 10 MG tablet Take 10 mg by mouth daily. 09/13/14  Yes [provider]  amoxicillin-clavulanate (AUGMENTIN) 875-125 MG tablet Take 1 tablet by mouth every 12 (twelve) hours. 11/01/21  Yes Ripley Fraise, MD  azelastine (ASTELIN) 0.1 % nasal spray Place 1-2 sprays into both nostrils 2 (two) times daily as needed. 08/04/21  Yes [provider]  colchicine 0.6 MG tablet Take 0.6 mg by mouth daily as needed (for gout).    Yes [provider]  gabapentin (NEURONTIN) 300 MG capsule  Take 300 mg by mouth 3 (three) times daily.   Yes [provider]  hydrochlorothiazide (HYDRODIURIL) 25 MG tablet Take 25 mg by mouth daily. 12/30/17  Yes [provider]  HYDROcodone-acetaminophen (NORCO/VICODIN) 5-325 MG tablet Take 1 tablet by mouth every 6 (six) hours as needed for severe pain. 11/01/21  Yes Ripley Fraise, MD  irbesartan (AVAPRO) 300 MG tablet Take 300 mg by mouth every morning. 09/01/18  Yes [provider]  linaclotide (LINZESS) 72 MCG capsule Take 72 mcg by mouth every evening.   Yes [provider]  metoprolol succinate (TOPROL-XL) 100 MG 24 hr tablet Take 100 mg by mouth daily. 09/23/17  Yes [provider]  Multiple Vitamin (MULTIVITAMIN WITH MINERALS) TABS tablet Take 1 tablet by mouth 2 (two) times a week.    Yes [provider]  omeprazole (PRILOSEC) 40 MG capsule Take 40 mg by mouth daily.   Yes [provider]  rivaroxaban (XARELTO) 20 MG TABS tablet Take 1 tablet (20 mg total) by mouth daily with supper. Appointment needed with cardiologist for further refills Patient taking differently: Take 20 mg by mouth at bedtime. Appointment needed with cardiologist for further refills 06/08/21  Yes Jerline Pain, MD  tadalafil (CIALIS) 5 MG tablet Take 5 mg by mouth daily.   Yes [provider]  tamsulosin (FLOMAX) 0.4 MG CAPS capsule Take 0.4 mg by mouth daily.   Yes [provider]  cetirizine (ZYRTEC ALLERGY)  10 MG tablet Take 1 tablet (10 mg total) by mouth daily. Patient not taking: Reported on 11/01/2021 08/02/20   Jaynee Eagles, PA-C  docusate sodium (COLACE) 100 MG capsule Take 1 capsule (100 mg total) by mouth every 12 (twelve) hours. Patient not taking: Reported on 11/01/2021 04/29/16   Leo Grosser, MD  methocarbamol (ROBAXIN) 500 MG tablet Take 1 tablet (500 mg total) by mouth 3 (three) times daily between meals as needed. Patient not taking: Reported on 11/01/2021 08/27/13   Tanna Furry, MD   predniSONE (DELTASONE) 10 MG tablet Take 3 tablets (30 mg total) by mouth daily with breakfast. Patient not taking: Reported on 11/01/2021 08/02/20   Jaynee Eagles, PA-C      Allergies    Benazepril, Pravastatin, Prednisone, and Valsartan    Review of Systems   Review of Systems  Constitutional:  Negative for fever.  Respiratory:  Negative for shortness of breath.   Cardiovascular:  Negative for chest pain.  Gastrointestinal:  Positive for abdominal pain and constipation. Negative for diarrhea, hematochezia, melena, nausea and vomiting.  Genitourinary:  Negative for dysuria and testicular pain.  All other systems reviewed and are negative.  Physical Exam Updated Vital Signs BP (!) 143/78    Pulse 60    Temp 97.9 F (36.6 C) (Oral)    Resp 14    Ht 1.778 m ('5\' 10"'$ )    Wt 108.4 kg    SpO2 98%    BMI 34.29 kg/m  Physical Exam CONSTITUTIONAL: Elderly, well-appearing HEAD: Normocephalic/atraumatic EYES: EOMI/PERRL ENMT: Mask in place NECK: supple no meningeal signs SPINE/BACK:entire spine nontender CV: S1/S2 noted, no murmurs/rubs/gallops noted LUNGS: Lungs are clear to auscultation bilaterally, no apparent distress ABDOMEN: soft, moderate LLQ tenderness, no rebound or guarding, bowel sounds noted throughout abdomen, no significant distention GU:no cva tenderness, there is no large inguinal hernia or tenderness noted NEURO: Pt is awake/alert/appropriate, moves all extremitiesx4.  No facial droop.   EXTREMITIES: pulses normal/equal, full ROM SKIN: warm, color normal PSYCH: no abnormalities of mood noted, alert and oriented to situation  ED Results / Procedures / Treatments   Labs (all labs ordered are listed, but only abnormal results are displayed) Labs Reviewed  COMPREHENSIVE METABOLIC PANEL - Abnormal; Notable for the following components:      Result Value   Glucose, Bld 103 (*)    Creatinine, Ser 1.72 (*)    GFR, Estimated 41 (*)    All other components within normal limits   LIPASE, BLOOD  CBC  URINALYSIS, ROUTINE W REFLEX MICROSCOPIC    EKG None  Radiology CT ABDOMEN PELVIS W CONTRAST  Result Date: 11/01/2021 CLINICAL DATA:  76 year old male with history of left lower quadrant abdominal pain. Decreased bowel movements. EXAM: CT ABDOMEN AND PELVIS WITH CONTRAST TECHNIQUE: Multidetector CT imaging of the abdomen and pelvis was performed using the standard protocol following bolus administration of intravenous contrast. RADIATION DOSE REDUCTION: This exam was performed according to the departmental dose-optimization program which includes automated exposure control, adjustment of the mA and/or kV according to patient size and/or use of iterative reconstruction technique. CONTRAST:  155m OMNIPAQUE IOHEXOL 300 MG/ML  SOLN COMPARISON:  CT of the abdomen and pelvis 09/22/2020. FINDINGS: Lower chest: Mild cardiomegaly. Hepatobiliary: Subcentimeter low-attenuation lesions in segment 4A and in segment 6 of the liver, too small to characterize, but statistically likely to represent tiny cysts. No other larger more suspicious appearing hepatic lesions are noted. No intra or extrahepatic biliary ductal dilatation. Gallbladder is normal in appearance. Pancreas:  No pancreatic mass. No pancreatic ductal dilatation. No pancreatic or peripancreatic fluid collections or inflammatory changes. Spleen: Unremarkable. Adrenals/Urinary Tract: There are numerous low-attenuation lesions in both kidneys, compatible with simple cysts, largest of which is exophytic in the upper pole of the left kidney measuring up to 9.2 cm in diameter. Other subcentimeter low-attenuation lesions in both kidneys are too small to definitively characterize, but similar to the prior study and statistically likely to represent tiny cysts. No hydroureteronephrosis. Urinary bladder is normal in appearance. Bilateral adrenal glands are normal in appearance. Stomach/Bowel: Normal appearance of the stomach. No pathologic  dilatation of small bowel or colon. Numerous colonic diverticulae are noted, particularly in the sigmoid colon where there are some very subtle surrounding inflammatory changes which could indicate very mild or early acute diverticulitis. Short segment of the sigmoid colon also extends into the patient's large left inguinal hernia. Normal appendix. Vascular/Lymphatic: Aortic atherosclerosis, without evidence of aneurysm or dissection in the abdominal or pelvic vasculature. No lymphadenopathy noted in the abdomen or pelvis. Reproductive: Prostate gland and seminal vesicles are unremarkable in appearance. Other: No significant volume of ascites.  No pneumoperitoneum. Musculoskeletal: There are no aggressive appearing lytic or blastic lesions noted in the visualized portions of the skeleton. IMPRESSION: 1. Colonic diverticulosis with subtle inflammatory changes in the sigmoid colon, concerning for an acute diverticulitis. No diverticular abscess or signs of frank perforation are noted at this time. 2. The patient also has a large left inguinal hernia containing a short segment of the proximal sigmoid colon. There is no evidence of associated bowel incarceration or obstruction at this time. 3. Aortic atherosclerosis. 4. Additional incidental findings, similar to prior studies, as above. Electronically Signed   By: Vinnie Langton M.D.   On: 11/01/2021 06:21    Procedures Procedures    Medications Ordered in ED Medications  amoxicillin-clavulanate (AUGMENTIN) 875-125 MG per tablet 1 tablet (has no administration in time range)  fentaNYL (SUBLIMAZE) injection 100 mcg (100 mcg Intravenous Given 11/01/21 0540)  lactated ringers bolus 1,000 mL (1,000 mLs Intravenous New Bag/Given 11/01/21 0540)  iohexol (OMNIPAQUE) 300 MG/ML solution 100 mL (100 mLs Intravenous Contrast Given 11/01/21 6606)    ED Course/ Medical Decision Making/ A&P Clinical Course as of 11/01/21 3016  Nancy Fetter Nov 01, 2021  0525 Creatinine(!):  1.72 Renal insufficiency [DW]  0549 Creatinine(!): 1.72 Renal insufficiency noted  [DW]  0109 CT scan consistent with early diverticulitis.  Patient well-appearing, not septic appearing.  He is appropriate for outpatient management [DW]  [phone] Patient also informed about the renal cysts which can be monitored [DW]  [phone] Patient also noted to have hernia on CT imaging.  However on clinical exam there is no significant hernia palpated.  He has no pain in that region.  No signs of bowel obstruction [DW]  0640 We discussed strict return precautions, as patient is very appropriate for outpatient management [DW]  [phone] Patient reports he already has follow-up with nephrology soon, and his renal insufficiency is likely chronic [DW]  [phone] Patient also reports he has follow-up with gastroenterology next month.  He reports colonoscopy last year with polypectomy [DW]    Clinical Course User Index [DW] Ripley Fraise, MD                           Medical Decision Making Amount and/or Complexity of Data Reviewed Labs: ordered. Decision-making details documented in ED Course. Radiology: ordered.  Risk Prescription drug management.   This  patient presents to the ED for concern of abdominal pain, this involves an extensive number of treatment options, and is a complaint that carries with it a high risk of complications and morbidity.  The differential diagnosis includes diverticulitis, bowel obstruction, constipation, bowel perforation, UTI, kidney stone  Comorbidities that complicate the patient evaluation: Patients presentation is complicated by their history of previous history of right hernia surgery    Additional history obtained: Additional history obtained from family and daughter Records reviewed Primary Care Documents  Lab Tests: I Ordered, and personally interpreted labs.  The pertinent results include: Renal insufficiency  Imaging Studies ordered: I ordered imaging studies  including CT scan abdomen/pelvis   I independently visualized and interpreted imaging which showed diverticulitis, renal cyst I agree with the radiologist interpretation  Cardiac Monitoring: The patient was maintained on a cardiac monitor.  I personally viewed and interpreted the cardiac monitor which showed an underlying rhythm of:  sinus rhythm  Medicines ordered and prescription drug management: I ordered medication including IV fentanyl for pain Reevaluation of the patient after these medicines showed that the patient    improved  Test Considered: Consider admission, patient is improving and not septic appearing.  He is safe for discharge home  Critical Interventions:  Antibiotics  Reevaluation: After the interventions noted above, I reevaluated the patient and found that they have :improved  Complexity of problems addressed: Patients presentation is most consistent with  acute presentation with potential threat to life or bodily function  Disposition: After consideration of the diagnostic results and the patients response to treatment,  I feel that the patent would benefit from discharge   .           Final Clinical Impression(s) / ED Diagnoses Final diagnoses:  Diverticulitis  Renal cyst  Non-recurrent unilateral inguinal hernia without obstruction or gangrene    Rx / DC Orders ED Discharge Orders          Ordered    amoxicillin-clavulanate (AUGMENTIN) 875-125 MG tablet  Every 12 hours        11/01/21 0634    HYDROcodone-acetaminophen (NORCO/VICODIN) 5-325 MG tablet  Every 6 hours PRN        11/01/21 2694              Ripley Fraise, MD 11/01/21 [phone]

## 2021-11-03 ENCOUNTER — Other Ambulatory Visit: Payer: Self-pay

## 2021-11-03 ENCOUNTER — Encounter: Payer: Self-pay | Admitting: Cardiology

## 2021-11-03 ENCOUNTER — Ambulatory Visit: Payer: Medicare Other | Admitting: Cardiology

## 2021-11-03 DIAGNOSIS — N183 Chronic kidney disease, stage 3 unspecified: Secondary | ICD-10-CM | POA: Insufficient documentation

## 2021-11-03 DIAGNOSIS — Z01810 Encounter for preprocedural cardiovascular examination: Secondary | ICD-10-CM

## 2021-11-03 DIAGNOSIS — I429 Cardiomyopathy, unspecified: Secondary | ICD-10-CM

## 2021-11-03 DIAGNOSIS — K4090 Unilateral inguinal hernia, without obstruction or gangrene, not specified as recurrent: Secondary | ICD-10-CM

## 2021-11-03 DIAGNOSIS — N1832 Chronic kidney disease, stage 3b: Secondary | ICD-10-CM | POA: Insufficient documentation

## 2021-11-03 DIAGNOSIS — N1831 Chronic kidney disease, stage 3a: Secondary | ICD-10-CM | POA: Diagnosis not present

## 2021-11-03 DIAGNOSIS — I480 Paroxysmal atrial fibrillation: Secondary | ICD-10-CM | POA: Diagnosis not present

## 2021-11-03 DIAGNOSIS — I447 Left bundle-branch block, unspecified: Secondary | ICD-10-CM | POA: Diagnosis not present

## 2021-11-03 MED ORDER — METOPROLOL SUCCINATE ER 50 MG PO TB24: 50.0000 mg | ORAL_TABLET | Freq: Every day | ORAL | 3 refills | Status: DC

## 2021-11-03 NOTE — Assessment & Plan Note (Signed)
Sinus bradycardia 48 with left and a branch block.  I will pull back his Toprol from 100-50. ?

## 2021-11-03 NOTE — Progress Notes (Signed)
Cardiology Office Note:    Date:  11/03/2021   ID:  JEIDEN DAUGHTRIDGE, DOB 11/27/1945, MRN [id number]  PCP:  Maury Dus, MD   [facility] Providers Cardiologist:  Candee Furbish, MD     Referring MD: Maury Dus, MD    History of Present Illness:    [name] [name] is a 76 y.o. male here for preop clearance prior to endoscopy.  [facility].  Hold Xarelto for 3 days prior.  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  He was seen in atrial fibrillation clinic with heart rate of 107 bpm, subsequent EKG showed sinus rhythm with left bundle branch block.  Chads vas score is 4, age hypertension remote TIAs x2.  Wife has Alzheimer's problems at night.  Minister.   Had nuclear stress test which showed EF of 41%, peri-infarct ischemia   Had heart catheterization which showed mild nonobstructive CAD.  Past Medical History:  Diagnosis Date   Arthritis    Gout    Heart murmur    Hypertension     Past Surgical History:  Procedure Laterality Date   HERNIA REPAIR     RIGHT/LEFT HEART CATH AND CORONARY ANGIOGRAPHY N/A 02/10/2018   Procedure: RIGHT/LEFT HEART CATH AND CORONARY ANGIOGRAPHY;  Surgeon: Nelva Bush, MD;  Location: [facility];  Service: Cardiovascular;  Laterality: N/A;    Current Medications: Current Meds  Medication Sig   allopurinol (ZYLOPRIM) 300 MG tablet Take 300 mg by mouth daily.   amLODipine (NORVASC) 10 MG tablet Take 10 mg by mouth daily.   amoxicillin-clavulanate (AUGMENTIN) 875-125 MG tablet Take 1 tablet by mouth every 12 (twelve) hours.   azelastine (ASTELIN) 0.1 % nasal spray Place 1-2 sprays into both nostrils 2 (two) times daily as needed.   cetirizine (ZYRTEC ALLERGY) 10 MG tablet Take 1 tablet (10 mg total) by mouth daily.   colchicine 0.6 MG tablet Take 0.6 mg by mouth daily as needed (for gout).    docusate  sodium (COLACE) 100 MG capsule Take 1 capsule (100 mg total) by mouth every 12 (twelve) hours.   gabapentin (NEURONTIN) 300 MG capsule Take 300 mg by mouth 3 (three) times daily.   hydrochlorothiazide (HYDRODIURIL) 25 MG tablet Take 25 mg by mouth daily.   HYDROcodone-acetaminophen (NORCO/VICODIN) 5-325 MG tablet Take 1 tablet by mouth every 6 (six) hours as needed for severe pain.   irbesartan (AVAPRO) 300 MG tablet Take 300 mg by mouth every morning.   linaclotide (LINZESS) 72 MCG capsule Take 72 mcg by mouth every evening.   Multiple Vitamin (MULTIVITAMIN WITH MINERALS) TABS tablet Take 1 tablet by mouth 2 (two) times a week.    omeprazole (PRILOSEC) 40 MG capsule Take 40 mg by mouth daily.   rivaroxaban (XARELTO) 20 MG TABS tablet Take 1 tablet (20 mg total) by mouth daily with supper. Appointment needed with cardiologist for further refills (Patient taking differently: Take 20 mg by mouth at bedtime. Appointment needed with cardiologist for further refills)   tadalafil (CIALIS) 5 MG tablet Take 5 mg by mouth daily.   tamsulosin (FLOMAX) 0.4 MG CAPS capsule Take 0.4 mg by mouth daily.   [DISCONTINUED] metoprolol succinate (TOPROL-XL) 100 MG 24 hr tablet Take 100 mg by mouth daily.   [DISCONTINUED] metoprolol succinate (TOPROL-XL) 50 MG 24 hr tablet Take 1 tablet (50 mg total) by mouth daily. Take with or immediately following  a meal.     Allergies:   Benazepril, Pravastatin, Prednisone, and Valsartan   Social History   Socioeconomic History   Marital status: Single    Spouse name: Not on file   Number of children: Not on file   Years of education: Not on file   Highest education level: Not on file  Occupational History   Not on file  Tobacco Use   Smoking status: Never   Smokeless tobacco: Never  Vaping Use   Vaping Use: Never used  Substance and Sexual Activity   Alcohol use: No   Drug use: No   Sexual activity: Not Currently  Other Topics Concern   Not on file  Social  History Narrative   Not on file   Social Determinants of Health   Financial Resource Strain: Not on file  Food Insecurity: Not on file  Transportation Needs: Not on file  Physical Activity: Not on file  Stress: Not on file  Social Connections: Not on file     Family History: The patient's family history includes Cancer in his mother; Heart disease in his father; Hypertension in his brother, father, mother, and sister.  ROS:   Please see the history of present illness.     All other systems reviewed and are negative.  EKGs/Labs/Other Studies Reviewed:    The following studies were reviewed today:  ECHO 2019:  - Left ventricle: The cavity size was normal. Wall thickness was    increased in a pattern of moderate LVH. Systolic function was    mildly reduced. The estimated ejection fraction was in the range    of 45% to 50%. Diffuse hypokinesis. Doppler parameters are    consistent with abnormal left ventricular relaxation (grade 1    diastolic dysfunction).  - Ventricular septum: Septal motion showed &quot;bounce&quot;.  - Mitral valve: There was mild regurgitation.  - Left atrium: The atrium was mildly dilated.   EKG:  EKG is  ordered today.  The ekg ordered today demonstrates SBrady 48 LBBB  Recent Labs: 11/01/2021: ALT 11; BUN 18; Creatinine, Ser 1.72; Hemoglobin 13.2; Platelets 247; Potassium 3.7; Sodium 135  Recent Lipid Panel No results found for: CHOL, TRIG, HDL, CHOLHDL, VLDL, LDLCALC, LDLDIRECT   Risk Assessment/Calculations:              Physical Exam:    VS:  BP 136/74 (BP Location: Left Arm, Patient Position: Sitting, Cuff Size: Normal)    Pulse (!) 48    Ht '5\' 10"'$  (1.778 m)    Wt 242 lb (109.8 kg)    SpO2 98%    BMI 34.72 kg/m     Wt Readings from Last 3 Encounters:  11/03/21 242 lb (109.8 kg)  11/01/21 239 lb (108.4 kg)  09/12/19 251 lb (113.9 kg)     GEN:  Well nourished, well developed in no acute distress HEENT: Normal NECK: No JVD; No  carotid bruits LYMPHATICS: No lymphadenopathy CARDIAC: Bradycardic regular, no murmurs, no rubs, gallops RESPIRATORY:  Clear to auscultation without rales, wheezing or rhonchi  ABDOMEN: Soft, non-tender, non-distended MUSCULOSKELETAL:  No edema; No deformity  SKIN: Warm and dry NEUROLOGIC:  Alert and oriented x 3 PSYCHIATRIC:  Normal affect   ASSESSMENT:    1. Left inguinal hernia   2. Cardiomyopathy, unspecified type ([place])   3. Paroxysmal atrial fibrillation ([place])   4. Left bundle branch block   5. Stage 3a chronic kidney disease ([place])   6. Preop cardiovascular exam    PLAN:  In order of problems listed above:  Left inguinal hernia I will go ahead and help Dr. Alyson Ingles out and get him referred to general surgery for evaluation.  This may be the source of some of his left lower flank discomfort.  I think that the kidney cyst may be incidental findings.  Cardiomyopathy ([place]) EF mildly reduced at 45%.  Continuing with goal-directed medical therapy.  Paroxysmal atrial fibrillation (HCC) Currently sinus bradycardia.  No atrial fibrillation.  Lets continue with Xarelto  Left bundle branch block Sinus bradycardia 48 with left and a branch block.  I will pull back his Toprol from 100-50.  Chronic kidney disease, stage III (moderate) (HCC) Last creatinine 1.72.  Avoid NSAIDs.  Preop cardiovascular exam He decided to cancel his EGD.  If he did need to go forward with this, he may from a cardiology perspective.  I am comfortable with him holding his Xarelto for 3 days prior.  No bridge.  If he needs to go forward with left inguinal hernia repair the same instruction applies.  He may proceed.         Medication Adjustments/Labs and Tests Ordered: Current medicines are reviewed at length with the patient today.  Concerns regarding medicines are outlined above.  Orders Placed This Encounter  Procedures   Ambulatory referral to General Surgery   EKG 12-Lead   Meds ordered this  encounter  Medications   DISCONTD: metoprolol succinate (TOPROL-XL) 50 MG 24 hr tablet    Sig: Take 1 tablet (50 mg total) by mouth daily. Take with or immediately following a meal.    Dispense:  90 tablet    Refill:  3   metoprolol succinate (TOPROL-XL) 50 MG 24 hr tablet    Sig: Take 1 tablet (50 mg total) by mouth daily. Take with or immediately following a meal.    Dispense:  90 tablet    Refill:  3    Patient Instructions  Medication Instructions:  Please decrease your Metoprolol to 50 mg a day. Continue all other medications as listed.  *If you need a refill on your cardiac medications before your next appointment, please call your pharmacy*   You have been referred to [facility] and will be contacted to be scheduled.  Follow-Up: At [facility], you and your health needs are our priority.  As part of our continuing mission to provide you with exceptional heart care, we have created designated Provider Care Teams.  These Care Teams include your primary Cardiologist (physician) and Advanced Practice Providers (APPs -  Physician Assistants and Nurse Practitioners) who all work together to provide you with the care you need, when you need it.  We recommend signing up for the patient portal called "MyChart".  Sign up information is provided on this After Visit Summary.  MyChart is used to connect with patients for Virtual Visits (Telemedicine).  Patients are able to view lab/test results, encounter notes, upcoming appointments, etc.  Non-urgent messages can be sent to your provider as well.   To learn more about what you can do with MyChart, go to [email or web address].    Your next appointment:   6 month(s)  The format for your next appointment:   In Person  Provider:   Candee Furbish, MD {   Thank you for choosing [facility]!!       Signed, Candee Furbish, MD  11/03/2021 5:03 PM    [place]

## 2021-11-03 NOTE — Assessment & Plan Note (Signed)
He decided to cancel his EGD.  If he did need to go forward with this, he may from a cardiology perspective.  I am comfortable with him holding his Xarelto for 3 days prior.  No bridge.  If he needs to go forward with left inguinal hernia repair the same instruction applies.  He may proceed. ?

## 2021-11-03 NOTE — Assessment & Plan Note (Signed)
Last creatinine 1.72.  Avoid NSAIDs. ?

## 2021-11-03 NOTE — Assessment & Plan Note (Signed)
I will go ahead and help Dr. Alyson Ingles out and get him referred to general surgery for evaluation.  This may be the source of some of his left lower flank discomfort.  I think that the kidney cyst may be incidental findings. ?

## 2021-11-03 NOTE — Assessment & Plan Note (Signed)
EF mildly reduced at 45%.  Continuing with goal-directed medical therapy. ?

## 2021-11-03 NOTE — Assessment & Plan Note (Signed)
Currently sinus bradycardia.  No atrial fibrillation.  Lets continue with Xarelto ?

## 2021-11-03 NOTE — Patient Instructions (Signed)
Medication Instructions:  ?Please decrease your Metoprolol to 50 mg a day. ?Continue all other medications as listed. ? ?*If you need a refill on your cardiac medications before your next appointment, please call your pharmacy* ? ? ?You have been referred to [facility] and will be contacted to be scheduled. ? ?Follow-Up: ?At [facility], you and your health needs are our priority.  As part of our continuing mission to provide you with exceptional heart care, we have created designated Provider Care Teams.  These Care Teams include your primary Cardiologist (physician) and Advanced Practice Providers (APPs -  Physician Assistants and Nurse Practitioners) who all work together to provide you with the care you need, when you need it. ? ?We recommend signing up for the patient portal called "MyChart".  Sign up information is provided on this After Visit Summary.  MyChart is used to connect with patients for Virtual Visits (Telemedicine).  Patients are able to view lab/test results, encounter notes, upcoming appointments, etc.  Non-urgent messages can be sent to your provider as well.   ?To learn more about what you can do with MyChart, go to [email or web address].   ? ?Your next appointment:   ?6 month(s) ? ?The format for your next appointment:   ?In Person ? ?Provider:   ?Candee Furbish, MD { ? ? ?Thank you for choosing [facility]!! ? ? ? ? ?

## 2021-11-04 DIAGNOSIS — I129 Hypertensive chronic kidney disease with stage 1 through stage 4 chronic kidney disease, or unspecified chronic kidney disease: Secondary | ICD-10-CM | POA: Diagnosis not present

## 2021-11-04 DIAGNOSIS — E785 Hyperlipidemia, unspecified: Secondary | ICD-10-CM | POA: Diagnosis not present

## 2021-11-04 DIAGNOSIS — I480 Paroxysmal atrial fibrillation: Secondary | ICD-10-CM | POA: Diagnosis not present

## 2021-11-04 DIAGNOSIS — N1832 Chronic kidney disease, stage 3b: Secondary | ICD-10-CM | POA: Diagnosis not present

## 2021-11-04 DIAGNOSIS — R1032 Left lower quadrant pain: Secondary | ICD-10-CM | POA: Diagnosis not present

## 2021-11-16 DIAGNOSIS — K5900 Constipation, unspecified: Secondary | ICD-10-CM | POA: Diagnosis not present

## 2021-11-16 DIAGNOSIS — J320 Chronic maxillary sinusitis: Secondary | ICD-10-CM | POA: Diagnosis not present

## 2021-11-16 DIAGNOSIS — K4090 Unilateral inguinal hernia, without obstruction or gangrene, not specified as recurrent: Secondary | ICD-10-CM | POA: Diagnosis not present

## 2021-11-17 ENCOUNTER — Other Ambulatory Visit: Payer: Self-pay | Admitting: Cardiology

## 2021-11-17 DIAGNOSIS — J343 Hypertrophy of nasal turbinates: Secondary | ICD-10-CM | POA: Diagnosis not present

## 2021-11-17 NOTE — Telephone Encounter (Signed)
Pt last saw Dr Marlou Porch 11/03/21, last labs 11/01/21 Creat 1.72, age 76, weight 109.8kg, CrCl 58.52, based on CrCl pt is on appropriate dosage of Xarelto '20mg'$  QD for afib.  Will refill rx.  ?

## 2021-12-18 DIAGNOSIS — E7800 Pure hypercholesterolemia, unspecified: Secondary | ICD-10-CM | POA: Diagnosis not present

## 2021-12-18 DIAGNOSIS — I480 Paroxysmal atrial fibrillation: Secondary | ICD-10-CM | POA: Diagnosis not present

## 2021-12-18 DIAGNOSIS — I10 Essential (primary) hypertension: Secondary | ICD-10-CM | POA: Diagnosis not present

## 2021-12-21 ENCOUNTER — Telehealth: Payer: Self-pay | Admitting: *Deleted

## 2021-12-21 DIAGNOSIS — R1012 Left upper quadrant pain: Secondary | ICD-10-CM | POA: Diagnosis not present

## 2021-12-21 NOTE — Telephone Encounter (Signed)
? ?  Pre-operative Risk Assessment  ?  ?Patient Name: [name] [name]  ?DOB: 08/03/1946 ?MRN: [id number]  ? ?  ? ?Request for Surgical Clearance   ? ?Procedure:   ENDOSCOPY ? ?Date of Surgery:  Clearance 03/10/22                              ?   ?Surgeon:  DR. Alessandra Bevels ?Surgeon's Group or Practice Name:  [facility] ?Phone number:  [phone] ?Fax number:  [phone] ?  ?Type of Clearance Requested:   ?- Medical  ?- Pharmacy:  Hold Rivaroxaban (Xarelto) x 3 DAYS PRIOR ?  ?Type of Anesthesia:   PROPOFOL ?  ?Additional requests/questions:   ? ?Signed, ?Julaine Hua   ?12/21/2021, 2:12 PM  ? ?

## 2021-12-22 DIAGNOSIS — G4733 Obstructive sleep apnea (adult) (pediatric): Secondary | ICD-10-CM | POA: Diagnosis not present

## 2021-12-23 ENCOUNTER — Telehealth: Payer: Self-pay | Admitting: *Deleted

## 2021-12-23 DIAGNOSIS — I482 Chronic atrial fibrillation, unspecified: Secondary | ICD-10-CM | POA: Diagnosis not present

## 2021-12-23 DIAGNOSIS — K4090 Unilateral inguinal hernia, without obstruction or gangrene, not specified as recurrent: Secondary | ICD-10-CM | POA: Diagnosis not present

## 2021-12-23 NOTE — Telephone Encounter (Signed)
f ? ?Pre-operative Risk Assessment  ?  ?Patient Name: [name] [name]  ?DOB: Dec 03, 1945 ?MRN: [id number]  ? ?  ? ?Request for Surgical Clearance   ? ?Procedure:   Inguinal hernia repair ? ?Date of Surgery:  Clearance TBD                              ?   ?Surgeon:  Dr Gurney Maxin ?Surgeon's Group or Practice Name: [facility] ?Phone number:  [phone] ?Fax number:  [phone] [name], [name] ?  ?Type of Clearance Requested:   ?- Pharmacy:  Hold Rivaroxaban (Xarelto)   ?  ?Type of Anesthesia:  General  ?  ?Additional requests/questions:   ? ?Signed, ?Bevelyn Buckles L   ?12/23/2021, 4:15 PM  ? ?

## 2021-12-24 NOTE — Telephone Encounter (Signed)
? ?  Patient Name: [name] [name]  ?DOB: 05/23/1946 ?MRN: [id number] ? ?Primary Cardiologist: Candee Furbish, MD ? ?Chart reviewed as part of pre-operative protocol coverage. Patient recently seen by Dr. Marlou Porch on 11/03/2021 and was without angina or decompensation.  Dr. Marlou Porch indicated the patient could hold anticoagulation for 3 days prior to procedure.  I attempted to contact the patient today to verify if he had any new symptoms or concerns but was unable to reach him. ? ? ?Christell Faith, PA-C ?12/24/2021, 10:40 AM ? ? ?

## 2021-12-28 NOTE — Telephone Encounter (Signed)
? ?  Patient Name: [name] [name]  ?DOB: 1946-01-04 ?MRN: [id number] ? ?Primary Cardiologist: Candee Furbish, MD ? ?Chart reviewed as part of pre-operative protocol coverage. Per recent OV note 10/2021, ? ?"Preop cardiovascular exam ?He decided to cancel his EGD.  If he did need to go forward with this, he may from a cardiology perspective.  I am comfortable with him holding his Xarelto for 3 days prior.  No bridge.  If he needs to go forward with left inguinal hernia repair the same instruction applies.  He may proceed." ? ?Since clearance was provided within the last 2 months, this clearance still applies and no phone call needed at this time. ? ?Will route this bundled recommendation to requesting provider via Epic fax function. Please call with questions. ? ?[name], PA-C ?12/28/2021, 8:05 AM ? ? ?

## 2022-01-01 ENCOUNTER — Other Ambulatory Visit (HOSPITAL_COMMUNITY): Payer: Medicare Other

## 2022-01-02 ENCOUNTER — Emergency Department (HOSPITAL_COMMUNITY): Payer: Medicare Other

## 2022-01-02 ENCOUNTER — Emergency Department (HOSPITAL_COMMUNITY)
Admission: EM | Admit: 2022-01-02 | Discharge: 2022-01-02 | Disposition: A | Discharge status: Home / Self Care | Payer: Medicare Other | Attending: Emergency Medicine | Admitting: Emergency Medicine

## 2022-01-02 ENCOUNTER — Other Ambulatory Visit: Payer: Self-pay

## 2022-01-02 ENCOUNTER — Encounter (HOSPITAL_COMMUNITY): Payer: Self-pay | Admitting: *Deleted

## 2022-01-02 DIAGNOSIS — Z79899 Other long term (current) drug therapy: Secondary | ICD-10-CM | POA: Insufficient documentation

## 2022-01-02 DIAGNOSIS — R1032 Left lower quadrant pain: Secondary | ICD-10-CM | POA: Diagnosis present

## 2022-01-02 DIAGNOSIS — K4090 Unilateral inguinal hernia, without obstruction or gangrene, not specified as recurrent: Secondary | ICD-10-CM | POA: Diagnosis not present

## 2022-01-02 DIAGNOSIS — R109 Unspecified abdominal pain: Secondary | ICD-10-CM | POA: Diagnosis not present

## 2022-01-02 LAB — BASIC METABOLIC PANEL
Anion gap: 8 (ref 5–15)
BUN: 19 mg/dL (ref 8–23)
CO2: 28 mmol/L (ref 22–32)
Calcium: 9.2 mg/dL (ref 8.9–10.3)
Chloride: 102 mmol/L (ref 98–111)
Creatinine, Ser: 1.83 mg/dL — ABNORMAL HIGH (ref 0.61–1.24)
GFR, Estimated: 38 mL/min — ABNORMAL LOW (ref >60)
Glucose, Bld: 101 mg/dL — ABNORMAL HIGH (ref 70–99)
Potassium: 3.8 mmol/L (ref 3.5–5.1)
Sodium: 138 mmol/L (ref 135–145)

## 2022-01-02 LAB — CBC WITH DIFFERENTIAL/PLATELET
Abs Immature Granulocytes: 0.01 10*3/uL (ref 0.00–0.07)
Basophils Absolute: 0 10*3/uL (ref 0.0–0.1)
Basophils Relative: 0 %
Eosinophils Absolute: 0.3 10*3/uL (ref 0.0–0.5)
Eosinophils Relative: 5 %
HCT: 38.4 % — ABNORMAL LOW (ref 39.0–52.0)
Hemoglobin: 13.3 g/dL (ref 13.0–17.0)
Immature Granulocytes: 0 %
Lymphocytes Relative: 35 %
Lymphs Abs: 2 10*3/uL (ref 0.7–4.0)
MCH: 31.8 pg (ref 26.0–34.0)
MCHC: 34.6 g/dL (ref 30.0–36.0)
MCV: 91.9 fL (ref 80.0–100.0)
Monocytes Absolute: 0.5 10*3/uL (ref 0.1–1.0)
Monocytes Relative: 9 %
Neutro Abs: 2.9 10*3/uL (ref 1.7–7.7)
Neutrophils Relative %: 51 %
Platelets: 210 10*3/uL (ref 150–400)
RBC: 4.18 MIL/uL — ABNORMAL LOW (ref 4.22–5.81)
RDW: 14.1 % (ref 11.5–15.5)
WBC: 5.7 10*3/uL (ref 4.0–10.5)
nRBC: 0 % (ref 0.0–0.2)

## 2022-01-02 MED ORDER — IOHEXOL 300 MG/ML  SOLN
100.0000 mL | Freq: Once | INTRAMUSCULAR | Status: AC | PRN
  Administered 2022-01-02: 80 mL via INTRAVENOUS

## 2022-01-02 MED ORDER — MORPHINE SULFATE (PF) 4 MG/ML IV SOLN
4.0000 mg | Freq: Once | INTRAVENOUS | Status: AC
  Administered 2022-01-02: 4 mg via INTRAVENOUS
  Filled 2022-01-02: qty 1

## 2022-01-02 MED ORDER — SODIUM CHLORIDE (PF) 0.9 % IJ SOLN
INTRAMUSCULAR | Status: AC
  Filled 2022-01-02: qty 50

## 2022-01-02 NOTE — ED Provider Triage Note (Signed)
Emergency Medicine Provider Triage Evaluation Note ? ?[name] [name] , a 76 y.o. male  was evaluated in triage.  Pt complains of left inguinal hernia. Supposed to have operation on 01/21/22. Last night around 9PM began having increase in pain and pressure in the area. States he feels like "he's going to have a baby." Denies N/V or fever.  ? ?Review of Systems  ?Positive:  ?Negative:  ? ?Physical Exam  ?BP (!) 145/75 (BP Location: Left Arm)   Pulse 60   Temp 98 ?F (36.7 ?C) (Oral)   Resp 18   Ht '5\' 10"'$  (1.778 m)   Wt 108.4 kg   SpO2 99%   BMI 34.29 kg/m?  ?Gen:   Awake, no distress   ?Resp:  Normal effort  ?MSK:   Moves extremities without difficulty  ?Other:   ? ?Medical Decision Making  ?Medically screening exam initiated at 8:44 AM.  Appropriate orders placed.  [name] [name] was informed that the remainder of the evaluation will be completed by another provider, this initial triage assessment does not replace that evaluation, and the importance of remaining in the ED until their evaluation is complete. ? ? ?  ?Mickie Hillier, PA-C ?01/02/22 0845 ? ?

## 2022-01-02 NOTE — ED Triage Notes (Signed)
Pt has a rt inguinal hernia resulting in pain, has surgery scheduled for the 24th of this month. ?

## 2022-01-02 NOTE — ED Provider Notes (Signed)
?[facility] DEPT ?Provider Note ? ? ?CSN: [id number] ?Arrival date & time: 01/02/22  0825 ? ?  ? ?History ? ?Chief Complaint  ?Patient presents with  ? Inguinal Hernia  ? ? ?[name] [name] is a 76 y.o. male. ? ?76 year old male with prior medical history as detailed below presents for evaluation.  Patient with known history of left inguinal hernia.  Patient with surgery scheduled for May 25 for repair of this left inguinal hernia. ? ?Patient complains of mildly increased pain to the left groin area since last night around 9 PM. ? ?He denies any specific inciting event.  He denies any nausea or vomiting.  He denies fever.  He denies any change in his bowel movements. ? ?He did not take anything at home for his pain. ? ? ?The history is provided by the patient and medical records.  ?Illness ?Location:  Pain at site of left inguinal hernia ?Severity:  Mild ?Onset quality:  Gradual ?Duration:  14 hours ?Timing:  Rare ?Progression:  Unchanged ?Chronicity:  Recurrent ? ?  ? ?Home Medications ?Prior to Admission medications   ?Medication Sig Start Date End Date Taking? Authorizing Provider  ?allopurinol (ZYLOPRIM) 300 MG tablet Take 300 mg by mouth daily.    [provider]  ?amLODipine (NORVASC) 10 MG tablet Take 10 mg by mouth daily. 09/13/14   [provider]  ?amoxicillin-clavulanate (AUGMENTIN) 875-125 MG tablet Take 1 tablet by mouth every 12 (twelve) hours. 11/01/21   Ripley Fraise, MD  ?azelastine (ASTELIN) 0.1 % nasal spray Place 1-2 sprays into both nostrils 2 (two) times daily as needed. 08/04/21   [provider]  ?cetirizine (ZYRTEC ALLERGY) 10 MG tablet Take 1 tablet (10 mg total) by mouth daily. 08/02/20   Jaynee Eagles, PA-C  ?colchicine 0.6 MG tablet Take 0.6 mg by mouth daily as needed (for gout).     [provider]  ?docusate sodium (COLACE) 100 MG capsule Take 1 capsule (100 mg total) by mouth every 12 (twelve) hours. 04/29/16   Leo Grosser, MD  ?gabapentin (NEURONTIN) 300 MG capsule Take 300 mg by mouth 3 (three) times daily.    [provider]  ?hydrochlorothiazide (HYDRODIURIL) 25 MG tablet Take 25 mg by mouth daily. 12/30/17   [provider]  ?HYDROcodone-acetaminophen (NORCO/VICODIN) 5-325 MG tablet Take 1 tablet by mouth every 6 (six) hours as needed for severe pain. 11/01/21   Ripley Fraise, MD  ?irbesartan (AVAPRO) 300 MG tablet Take 300 mg by mouth every morning. 09/01/18   [provider]  ?linaclotide (LINZESS) 72 MCG capsule Take 72 mcg by mouth every evening.    [provider]  ?methocarbamol (ROBAXIN) 500 MG tablet Take 1 tablet (500 mg total) by mouth 3 (three) times daily between meals as needed. ?Patient not taking: Reported on 11/01/2021 08/27/13   Tanna Furry, MD  ?metoprolol succinate (TOPROL-XL) 50 MG 24 hr tablet Take 1 tablet (50 mg total) by mouth daily. Take with or immediately following a meal. 11/03/21   Jerline Pain, MD  ?Multiple Vitamin (MULTIVITAMIN WITH MINERALS) TABS tablet Take 1 tablet by mouth 2 (two) times a week.     [provider]  ?omeprazole (PRILOSEC) 40 MG capsule Take 40 mg by mouth daily.    [provider]  ?predniSONE (DELTASONE) 10 MG tablet Take 3 tablets (30 mg total) by mouth daily with breakfast. ?Patient not taking: Reported on 11/01/2021 08/02/20   Jaynee Eagles, PA-C  ?rivaroxaban (XARELTO) 20 MG  TABS tablet Take 1 tablet (20 mg total) by mouth daily with supper. 11/17/21   Jerline Pain, MD  ?tadalafil (CIALIS) 5 MG tablet Take 5 mg by mouth daily.    [provider]  ?tamsulosin (FLOMAX) 0.4 MG CAPS capsule Take 0.4 mg by mouth daily.    [provider]  ?   ? ?Allergies    ?Benazepril, Pravastatin, Prednisone, and Valsartan   ? ?Review of Systems   ?Review of Systems  ?All other systems reviewed and are negative. ? ?Physical Exam ?Updated Vital Signs ?BP (!) 152/64   Pulse (!) 58   Temp 98 ?F (36.7 ?C) (Oral)   Resp 18    Ht '5\' 10"'$  (1.778 m)   Wt 108.4 kg   SpO2 100%   BMI 34.29 kg/m?  ?Physical Exam ?Vitals and nursing note reviewed.  ?Constitutional:   ?   General: He is not in acute distress. ?   Appearance: Normal appearance. He is well-developed.  ?HENT:  ?   Head: Normocephalic and atraumatic.  ?Eyes:  ?   Conjunctiva/sclera: Conjunctivae normal.  ?   Pupils: Pupils are equal, round, and reactive to light.  ?Cardiovascular:  ?   Rate and Rhythm: Normal rate and regular rhythm.  ?   Heart sounds: Normal heart sounds.  ?Pulmonary:  ?   Effort: Pulmonary effort is normal. No respiratory distress.  ?   Breath sounds: Normal breath sounds.  ?Abdominal:  ?   General: There is no distension.  ?   Palpations: Abdomen is soft.  ?   Tenderness: There is no abdominal tenderness.  ?   Comments: Mild tenderness over the left inguinal groin.  Palpable hernias present.  ?Musculoskeletal:     ?   General: No deformity. Normal range of motion.  ?   Cervical back: Normal range of motion and neck supple.  ?Skin: ?   General: Skin is warm and dry.  ?Neurological:  ?   General: No focal deficit present.  ?   Mental Status: He is alert and oriented to person, place, and time.  ? ? ?ED Results / Procedures / Treatments   ?Labs ?(all labs ordered are listed, but only abnormal results are displayed) ?Labs Reviewed  ?BASIC METABOLIC PANEL - Abnormal; Notable for the following components:  ?    Result Value  ? Glucose, Bld 101 (*)   ? Creatinine, Ser 1.83 (*)   ? GFR, Estimated 38 (*)   ? All other components within normal limits  ?CBC WITH DIFFERENTIAL/PLATELET - Abnormal; Notable for the following components:  ? RBC 4.18 (*)   ? HCT 38.4 (*)   ? All other components within normal limits  ? ? ?EKG ?None ? ?Radiology ?CT Abdomen Pelvis W Contrast ? ?Result Date: 01/02/2022 ?CLINICAL DATA:  Left inguinal hernia.  Worsening left inguinal pain. EXAM: CT ABDOMEN AND PELVIS WITH CONTRAST TECHNIQUE: Multidetector CT imaging of the abdomen and pelvis was  performed using the standard protocol following bolus administration of intravenous contrast. RADIATION DOSE REDUCTION: This exam was performed according to the departmental dose-optimization program which includes automated exposure control, adjustment of the mA and/or kV according to patient size and/or use of iterative reconstruction technique. CONTRAST:  71m OMNIPAQUE IOHEXOL 300 MG/ML  SOLN COMPARISON:  11/01/2021 FINDINGS: Lower Chest: No acute findings. Hepatobiliary: No hepatic masses identified. Gallbladder is unremarkable. No evidence of biliary ductal dilatation. Pancreas:  No mass or inflammatory changes. Spleen: Within normal limits in size and appearance. Adrenals/Urinary  Tract: No masses identified. Multiple benign-appearing renal cysts are again seen bilaterally (no followup imaging is recommended). No evidence of ureteral calculi or hydronephrosis. Stomach/Bowel: Small hiatal hernia noted. No evidence of obstruction, inflammatory process or abnormal fluid collections. Colonic diverticulosis again noted, however there is no evidence of diverticulitis. A small left inguinal hernia is seen containing a loop of sigmoid colon. This is mildly increased in size, however there is no evidence of bowel obstruction or strangulation. Vascular/Lymphatic: No pathologically enlarged lymph nodes. No acute vascular findings. Aortic atherosclerotic calcification noted. Reproductive:  Stable mildly enlarged prostate. Other:  None. Musculoskeletal: No suspicious bone lesions identified. 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 IMPRESSION: Mild increase in size of small left inguinal hernia containing a loop of sigmoid colon. No evidence of bowel obstruction or strangulation. Colonic diverticulosis, without radiographic evidence of diverticulitis. Small hiatal hernia. Mildly enlarged prostate. Aortic Atherosclerosis (ICD10-I70.0). Electronically Signed   By: Marlaine Hind M.D.   On: 01/02/2022 11:45    ? ?Procedures ?Procedures  ? ? ?Medications Ordered in ED ?Medications  ?sodium chloride (PF) 0.9 % injection (has no administration in time range)  ?iohexol (OMNIPAQUE) 300 MG/ML solution 100 mL (80 mLs Intraven

## 2022-01-02 NOTE — Discharge Instructions (Addendum)
Return of for any problem.  ? ?Use Acetaminophen or Tramadol as previously prescribed for pain.  ? ?If you developing worsening pain, fever, or vomiting return to the ED for evaluation. ?

## 2022-01-05 NOTE — Progress Notes (Addendum)
For Short Stay: ?[facility] appointment date: N/A ?Date of COVID positive in last 22 days:N/A ? ?Bowel Prep reminder: N/A ? ? ?For Anesthesia: ?PCP - Maury Dus, MD ?Cardiologist - Jerline Pain, MD last office visit note 11/03/21 in epic ? ?Chest x-ray - N/A ?EKG - 11/03/21 in epic ?Stress Test - 01/20/18 in epic ?ECHO - 01/20/18 in epic ?Cardiac Cath - 02/10/18 ?Pacemaker/ICD device last checked: N/A ?Pacemaker orders received: N/A ?Device Rep notified: N/A ? ?Spinal Cord Stimulator: N/A ? ?Sleep Study - 02/16/18 in epic ?CPAP - Yes ? ?Fasting Blood Sugar - N/A ?Checks Blood Sugar ___N/A__ times a day ?Date and result of last Hgb A1c-N/A ? ?Blood Thinner Instructions: HOLD XARETO 3 DAYS last dose will be 01/06/2022 9:30 PM ?Aspirin Instructions: N/A ?Last Dose:N/A ? ?Activity level: Can go up a flight of stairs and activities of daily living without stopping and without chest pain and/or shortness of breath ?   ?Anesthesia review: nonischemic cardiomyopathy, mild to moderate nonobstructive CAD,Paroxysmal atrial fibrillation, left bundle branch block, stage 3 CKD  ? ?Patient denies shortness of breath, fever, cough and chest pain at PAT appointment ? ? ?Patient verbalized understanding of instructions that were given to them at the PAT appointment. Patient was also instructed that they will need to review over the PAT instructions again at home before surgery.  ?

## 2022-01-05 NOTE — Progress Notes (Signed)
Sent message, via epic in basket, requesting orders in epic from surgeon.  

## 2022-01-06 ENCOUNTER — Encounter (HOSPITAL_COMMUNITY): Payer: Self-pay | Admitting: General Surgery

## 2022-01-06 ENCOUNTER — Other Ambulatory Visit: Payer: Self-pay

## 2022-01-07 NOTE — Anesthesia Preprocedure Evaluation (Addendum)
Anesthesia Evaluation  ?Patient identified by MRN, date of birth, ID band ?Patient awake ? ? ? ?Reviewed: ?Allergy & Precautions, NPO status , Patient's Chart, lab work & pertinent test results ? ?Airway ?Mallampati: III ? ?TM Distance: >3 FB ?Neck ROM: Full ? ? ? Dental ? ?(+) Teeth Intact, Dental Advisory Given ?  ?Pulmonary ?sleep apnea ,  ?  ?breath sounds clear to auscultation ? ? ? ? ? ? Cardiovascular ?hypertension, + CAD  ?+ dysrhythmias + Valvular Problems/Murmurs  ?Rhythm:Regular Rate:Normal ? ?Echo: ?Left ventricle: The cavity size was normal. Wall thickness was  ???increased in a pattern of moderate LVH. Systolic function was  ???mildly reduced. The estimated ejection fraction was in the range  ???of 45% to 50%. Diffuse hypokinesis. Doppler parameters are  ???consistent with abnormal left ventricular relaxation (grade 1  ???diastolic dysfunction).  ?- Ventricular septum: Septal motion showed "bounce".  ?- Mitral valve: There was mild regurgitation.  ?- Left atrium: The atrium was mildly dilated.  ?  ?Neuro/Psych ?TIAnegative psych ROS  ? GI/Hepatic ?Neg liver ROS, GERD  ,  ?Endo/Other  ?negative endocrine ROS ? Renal/GU ?Renal disease  ? ?  ?Musculoskeletal ? ?(+) Arthritis ,  ? Abdominal ?Normal abdominal exam  (+)   ?Peds ? Hematology ?negative hematology ROS ?(+)   ?Anesthesia Other Findings ? ? Reproductive/Obstetrics ? ?  ? ? ? ? ? ? ? ? ? ? ? ? ? ?  ?  ? ? ? ? ? ?Anesthesia Physical ?Anesthesia Plan ? ?ASA: 3 ? ?Anesthesia Plan: General  ? ?Post-op Pain Management:   ? ?Induction: Intravenous ? ?PONV Risk Score and Plan: 3 and Ondansetron, Dexamethasone and Treatment may vary due to age or medical condition ? ?Airway Management Planned: Oral ETT ? ?Additional Equipment: None ? ?Intra-op Plan:  ? ?Post-operative Plan: Extubation in OR ? ?Informed Consent: I have reviewed the patients History and Physical, chart, labs and discussed the procedure including the risks,  benefits and alternatives for the proposed anesthesia with the patient or authorized representative who has indicated his/her understanding and acceptance.  ? ? ? ?Dental advisory given ? ?Plan Discussed with: CRNA ? ?Anesthesia Plan Comments: (See PAT note 01/07/2022)  ? ? ? ? ?Anesthesia Quick Evaluation ? ?

## 2022-01-07 NOTE — Progress Notes (Signed)
Anesthesia Chart Review ? ? Case: [id number] Date/Time: 01/12/22 1315  ? Procedure: XI ROBOTIC ASSISTED LEFT INGUINAL HERNIA REPAIR WITH MESH (Left)  ? Anesthesia type: General  ? Pre-op diagnosis: LEFT INGUINAL HERNIA  ? Location: [facility] ROOM 05 / [facility]  ? Surgeons: Kinsinger, Arta Bruce, MD  ? ?  ? ? ?DISCUSSION:76 y.o. never smoker with h/o HTN, GERD, sleep apnea, CAD, PAF (on Xarelto), LBBB, CKD Stage III, left inguinal hernia scheduled for above procedure 01/12/22 with Dr. Gurney Maxin.  ? ?Per cardiology preoperative evaluation 01/06/2022, "Preop cardiovascular exam ?He decided to cancel his EGD.  If he did need to go forward with this, he may from a cardiology perspective.  I am comfortable with him holding his Xarelto for 3 days prior.  No bridge.  If he needs to go forward with left inguinal hernia repair the same instruction applies.  He may proceed." ?  ?Since clearance was provided within the last 2 months, this clearance still applies and no phone call needed at this time." ? ?Anticipate pt can proceed with planned procedure barring acute status change.   ?VS: Ht '5\' 10"'$  (1.778 m)   Wt 108 kg   BMI 34.15 kg/m?  ? ?PROVIDERS: ?Maury Dus, MD is PCP  ? ?Candee Furbish, MD is Cardiologist  ? ?LABS: Labs reviewed: Acceptable for surgery. ?(all labs ordered are listed, but only abnormal results are displayed) ? ?Labs Reviewed - No data to display ? ? ?IMAGES: ? ? ?EKG: ?11/03/2021 ?Rate 48 bpm  ?Sinus bradycardia ?LBBB ? ?CV: ?Cardiac Cath 02/10/2018 ?Conclusions: ?Non-obstructive coronary artery disease, including 50% distal LCx stenosis. ?Normal left and right heart filling pressures. ?Upper normal pulmonary artery pressure. ?Normal Fick cardiac output/index. ?  ?Recommendations: ?Medical therapy and risk factor modification to prevent progression of coronary artery disease. ?Continue medical management of non-ischemic cardiomyopathy.  Escalation of antihypertensive regimen will need to be considered. ?If no  evidence of bleeding or vascular injury, rivaroxaban can be restarted tomorrow evening. ? ?Echo 01/20/2018 ?- Left ventricle: The cavity size was normal. Wall thickness was  ?  increased in a pattern of moderate LVH. Systolic function was  ?  mildly reduced. The estimated ejection fraction was in the range  ?  of 45% to 50%. Diffuse hypokinesis. Doppler parameters are  ?  consistent with abnormal left ventricular relaxation (grade 1  ?  diastolic dysfunction).  ?- Ventricular septum: Septal motion showed &quot;bounce&quot;.  ?- Mitral valve: There was mild regurgitation.  ?- Left atrium: The atrium was mildly dilated.  ?Past Medical History:  ?Diagnosis Date  ? Arthritis   ? CKD (chronic kidney disease), stage III ([place])   ? Coronary artery disease   ? GERD (gastroesophageal reflux disease)   ? Gout   ? Heart murmur   ? History of TIA (transient ischemic attack)   ? Hypertension   ? Inguinal hernia   ? Left  ? Kidney cysts   ? Left  ? LBBB (left bundle branch block)   ? Non-ischemic cardiomyopathy ([place])   ? Paroxysmal atrial fibrillation ([facility])   ? Sleep apnea   ? ? ?Past Surgical History:  ?Procedure Laterality Date  ? HERNIA REPAIR Right   ? RIGHT/LEFT HEART CATH AND CORONARY ANGIOGRAPHY N/A 02/10/2018  ? Procedure: RIGHT/LEFT HEART CATH AND CORONARY ANGIOGRAPHY;  Surgeon: Nelva Bush, MD;  Location: [facility];  Service: Cardiovascular;  Laterality: N/A;  ? ? ?MEDICATIONS: ?No current facility-administered medications for this encounter.  ? ? allopurinol (  ZYLOPRIM) 300 MG tablet  ? amLODipine (NORVASC) 10 MG tablet  ? atorvastatin (LIPITOR) 10 MG tablet  ? cetirizine (ZYRTEC ALLERGY) 10 MG tablet  ? colchicine 0.6 MG tablet  ? ezetimibe (ZETIA) 10 MG tablet  ? fluticasone (FLONASE) 50 MCG/ACT nasal spray  ? gabapentin (NEURONTIN) 300 MG capsule  ? hydrochlorothiazide (HYDRODIURIL) 25 MG tablet  ? HYDROcodone-acetaminophen (NORCO/VICODIN) 5-325 MG tablet  ? hydrocortisone 2.5 % cream  ? irbesartan  (AVAPRO) 300 MG tablet  ? linaclotide (LINZESS) 72 MCG capsule  ? metoprolol succinate (TOPROL-XL) 50 MG 24 hr tablet  ? Multiple Vitamin (MULTIVITAMIN WITH MINERALS) TABS tablet  ? omeprazole (PRILOSEC) 40 MG capsule  ? polyethylene glycol (MIRALAX / GLYCOLAX) 17 g packet  ? promethazine (PHENERGAN) 25 MG tablet  ? rivaroxaban (XARELTO) 20 MG TABS tablet  ? tadalafil (CIALIS) 5 MG tablet  ? tamsulosin (FLOMAX) 0.4 MG CAPS capsule  ? traMADol (ULTRAM) 50 MG tablet  ? ?Konrad Felix Ward, PA-C ?[facility] ?[phone] ? ? ? ? ? ? ?

## 2022-01-08 ENCOUNTER — Ambulatory Visit: Payer: Self-pay | Admitting: General Surgery

## 2022-01-12 ENCOUNTER — Ambulatory Visit (HOSPITAL_BASED_OUTPATIENT_CLINIC_OR_DEPARTMENT_OTHER): Payer: Medicare Other | Admitting: Physician Assistant

## 2022-01-12 ENCOUNTER — Encounter (HOSPITAL_COMMUNITY): Payer: Self-pay | Admitting: General Surgery

## 2022-01-12 ENCOUNTER — Other Ambulatory Visit: Payer: Self-pay

## 2022-01-12 ENCOUNTER — Encounter (HOSPITAL_COMMUNITY)
Admission: RE | Disposition: A | Discharge status: Home / Self Care | Payer: Self-pay | Source: Home / Self Care | Attending: General Surgery

## 2022-01-12 ENCOUNTER — Ambulatory Visit (HOSPITAL_COMMUNITY): Payer: Medicare Other | Admitting: Physician Assistant

## 2022-01-12 ENCOUNTER — Ambulatory Visit (HOSPITAL_COMMUNITY)
Admission: RE | Admit: 2022-01-12 | Discharge: 2022-01-12 | Disposition: A | Discharge status: Home / Self Care | Payer: Medicare Other | Attending: General Surgery | Admitting: General Surgery

## 2022-01-12 ENCOUNTER — Encounter (HOSPITAL_COMMUNITY)
Admission: RE | Admit: 2022-01-12 | Discharge: 2022-01-12 | Disposition: A | Discharge status: Home / Self Care | Payer: Medicare Other | Source: Ambulatory Visit | Attending: Family Medicine | Admitting: Family Medicine

## 2022-01-12 DIAGNOSIS — Z87891 Personal history of nicotine dependence: Secondary | ICD-10-CM | POA: Diagnosis not present

## 2022-01-12 DIAGNOSIS — I10 Essential (primary) hypertension: Secondary | ICD-10-CM

## 2022-01-12 DIAGNOSIS — I2510 Atherosclerotic heart disease of native coronary artery without angina pectoris: Secondary | ICD-10-CM | POA: Diagnosis not present

## 2022-01-12 DIAGNOSIS — K4090 Unilateral inguinal hernia, without obstruction or gangrene, not specified as recurrent: Secondary | ICD-10-CM | POA: Diagnosis not present

## 2022-01-12 DIAGNOSIS — I482 Chronic atrial fibrillation, unspecified: Secondary | ICD-10-CM | POA: Diagnosis not present

## 2022-01-12 DIAGNOSIS — G4730 Sleep apnea, unspecified: Secondary | ICD-10-CM | POA: Diagnosis not present

## 2022-01-12 HISTORY — DX: Atherosclerotic heart disease of native coronary artery without angina pectoris: I25.10

## 2022-01-12 HISTORY — DX: Personal history of transient ischemic attack (TIA), and cerebral infarction without residual deficits: Z86.73

## 2022-01-12 HISTORY — DX: Paroxysmal atrial fibrillation: I48.0

## 2022-01-12 HISTORY — DX: Other cardiomyopathies: I42.8

## 2022-01-12 HISTORY — DX: Cyst of kidney, acquired: N28.1

## 2022-01-12 HISTORY — DX: Unilateral inguinal hernia, without obstruction or gangrene, not specified as recurrent: K40.90

## 2022-01-12 HISTORY — DX: Gastro-esophageal reflux disease without esophagitis: K21.9

## 2022-01-12 HISTORY — PX: XI ROBOTIC ASSISTED INGUINAL HERNIA REPAIR WITH MESH: SHX6706

## 2022-01-12 HISTORY — DX: Left bundle-branch block, unspecified: I44.7

## 2022-01-12 HISTORY — DX: Sleep apnea, unspecified: G47.30

## 2022-01-12 HISTORY — DX: Chronic kidney disease, stage 3 unspecified: N18.30

## 2022-01-12 SURGERY — REPAIR, HERNIA, INGUINAL, ROBOT-ASSISTED, LAPAROSCOPIC, USING MESH
Anesthesia: General | Site: Abdomen | Laterality: Left

## 2022-01-12 MED ORDER — SUGAMMADEX SODIUM 500 MG/5ML IV SOLN
INTRAVENOUS | Status: AC
  Filled 2022-01-12: qty 5

## 2022-01-12 MED ORDER — ENSURE PRE-SURGERY PO LIQD
296.0000 mL | Freq: Once | ORAL | Status: DC
  Filled 2022-01-12: qty 296

## 2022-01-12 MED ORDER — CHLORHEXIDINE GLUCONATE 0.12 % MT SOLN
15.0000 mL | Freq: Once | OROMUCOSAL | Status: AC
  Administered 2022-01-12: 15 mL via OROMUCOSAL

## 2022-01-12 MED ORDER — DEXAMETHASONE SODIUM PHOSPHATE 10 MG/ML IJ SOLN
INTRAMUSCULAR | Status: AC
  Filled 2022-01-12: qty 1

## 2022-01-12 MED ORDER — CEFAZOLIN SODIUM-DEXTROSE 2-4 GM/100ML-% IV SOLN
2.0000 g | INTRAVENOUS | Status: AC
  Administered 2022-01-12: 2 g via INTRAVENOUS
  Filled 2022-01-12: qty 100

## 2022-01-12 MED ORDER — ACETAMINOPHEN 500 MG PO TABS
1000.0000 mg | ORAL_TABLET | ORAL | Status: AC
  Administered 2022-01-12: 1000 mg via ORAL
  Filled 2022-01-12: qty 2

## 2022-01-12 MED ORDER — BUPIVACAINE-EPINEPHRINE (PF) 0.25% -1:200000 IJ SOLN
INTRAMUSCULAR | Status: AC
  Filled 2022-01-12: qty 30

## 2022-01-12 MED ORDER — LIDOCAINE HCL (PF) 2 % IJ SOLN
INTRAMUSCULAR | Status: AC
  Filled 2022-01-12: qty 5

## 2022-01-12 MED ORDER — LACTATED RINGERS IV SOLN: INTRAVENOUS | Status: DC | PRN

## 2022-01-12 MED ORDER — PHENYLEPHRINE HCL (PRESSORS) 10 MG/ML IV SOLN
INTRAVENOUS | Status: AC
  Filled 2022-01-12: qty 1

## 2022-01-12 MED ORDER — OXYCODONE HCL 5 MG PO TABS: 5.0000 mg | ORAL_TABLET | Freq: Four times a day (QID) | ORAL | 0 refills | Status: DC | PRN

## 2022-01-12 MED ORDER — ACETAMINOPHEN 160 MG/5ML PO SOLN: 325.0000 mg | ORAL | Status: DC | PRN

## 2022-01-12 MED ORDER — OXYCODONE HCL 5 MG/5ML PO SOLN: 5.0000 mg | Freq: Once | ORAL | Status: DC | PRN

## 2022-01-12 MED ORDER — SUGAMMADEX SODIUM 500 MG/5ML IV SOLN
INTRAVENOUS | Status: DC | PRN
  Administered 2022-01-12: 220 mg via INTRAVENOUS

## 2022-01-12 MED ORDER — STERILE WATER FOR IRRIGATION IR SOLN
Status: DC | PRN
  Administered 2022-01-12: 1000 mL

## 2022-01-12 MED ORDER — OXYCODONE HCL 5 MG PO TABS: 5.0000 mg | ORAL_TABLET | Freq: Once | ORAL | Status: DC | PRN

## 2022-01-12 MED ORDER — BUPIVACAINE LIPOSOME 1.3 % IJ SUSP: 20.0000 mL | Freq: Once | INTRAMUSCULAR | Status: DC

## 2022-01-12 MED ORDER — PROPOFOL 10 MG/ML IV BOLUS
INTRAVENOUS | Status: AC
  Filled 2022-01-12: qty 20

## 2022-01-12 MED ORDER — FENTANYL CITRATE PF 50 MCG/ML IJ SOSY: 25.0000 ug | PREFILLED_SYRINGE | INTRAMUSCULAR | Status: DC | PRN

## 2022-01-12 MED ORDER — PROPOFOL 10 MG/ML IV BOLUS
INTRAVENOUS | Status: DC | PRN
Start: 2022-01-12 — End: 2022-01-12
  Administered 2022-01-12: 140 mg via INTRAVENOUS

## 2022-01-12 MED ORDER — BUPIVACAINE LIPOSOME 1.3 % IJ SUSP
INTRAMUSCULAR | Status: AC
  Filled 2022-01-12: qty 20

## 2022-01-12 MED ORDER — BUPIVACAINE LIPOSOME 1.3 % IJ SUSP
INTRAMUSCULAR | Status: DC | PRN
Start: 2022-01-12 — End: 2022-01-12
  Administered 2022-01-12: 16 mL
  Administered 2022-01-12: 4 mL

## 2022-01-12 MED ORDER — CHLORHEXIDINE GLUCONATE CLOTH 2 % EX PADS: 6.0000 | MEDICATED_PAD | Freq: Once | CUTANEOUS | Status: DC

## 2022-01-12 MED ORDER — ORAL CARE MOUTH RINSE: 15.0000 mL | Freq: Once | OROMUCOSAL | Status: AC

## 2022-01-12 MED ORDER — AMISULPRIDE (ANTIEMETIC) 5 MG/2ML IV SOLN: 10.0000 mg | Freq: Once | INTRAVENOUS | Status: DC | PRN

## 2022-01-12 MED ORDER — DEXAMETHASONE SODIUM PHOSPHATE 10 MG/ML IJ SOLN
INTRAMUSCULAR | Status: DC | PRN
  Administered 2022-01-12: 8 mg via INTRAVENOUS

## 2022-01-12 MED ORDER — BUPIVACAINE-EPINEPHRINE 0.25% -1:200000 IJ SOLN
INTRAMUSCULAR | Status: DC | PRN
  Administered 2022-01-12: 6 mL
  Administered 2022-01-12: 24 mL

## 2022-01-12 MED ORDER — FENTANYL CITRATE (PF) 100 MCG/2ML IJ SOLN
INTRAMUSCULAR | Status: DC | PRN
  Administered 2022-01-12 (×4): 50 ug via INTRAVENOUS

## 2022-01-12 MED ORDER — LIDOCAINE 2% (20 MG/ML) 5 ML SYRINGE
INTRAMUSCULAR | Status: DC | PRN
  Administered 2022-01-12: 40 mg via INTRAVENOUS

## 2022-01-12 MED ORDER — ACETAMINOPHEN 10 MG/ML IV SOLN: 1000.0000 mg | Freq: Once | INTRAVENOUS | Status: DC | PRN

## 2022-01-12 MED ORDER — ROCURONIUM BROMIDE 10 MG/ML (PF) SYRINGE
PREFILLED_SYRINGE | INTRAVENOUS | Status: AC
  Filled 2022-01-12: qty 10

## 2022-01-12 MED ORDER — ACETAMINOPHEN 325 MG PO TABS: 325.0000 mg | ORAL_TABLET | ORAL | Status: DC | PRN

## 2022-01-12 MED ORDER — ROCURONIUM BROMIDE 10 MG/ML (PF) SYRINGE
PREFILLED_SYRINGE | INTRAVENOUS | Status: DC | PRN
  Administered 2022-01-12: 50 mg via INTRAVENOUS
  Administered 2022-01-12: 10 mg via INTRAVENOUS

## 2022-01-12 MED ORDER — ONDANSETRON HCL 4 MG/2ML IJ SOLN
INTRAMUSCULAR | Status: AC
  Filled 2022-01-12: qty 2

## 2022-01-12 MED ORDER — LACTATED RINGERS IV SOLN: INTRAVENOUS | Status: DC

## 2022-01-12 MED ORDER — FENTANYL CITRATE (PF) 250 MCG/5ML IJ SOLN
INTRAMUSCULAR | Status: AC
  Filled 2022-01-12: qty 5

## 2022-01-12 MED ORDER — ONDANSETRON HCL 4 MG/2ML IJ SOLN
INTRAMUSCULAR | Status: DC | PRN
  Administered 2022-01-12: 4 mg via INTRAVENOUS

## 2022-01-12 SURGICAL SUPPLY — 53 items
APL PRP STRL LF DISP 70% ISPRP (MISCELLANEOUS) ×1
APL SWBSTK 6 STRL LF DISP (MISCELLANEOUS) ×2
APPLICATOR COTTON TIP 6 STRL (MISCELLANEOUS) ×2 IMPLANT
APPLICATOR COTTON TIP 6IN STRL (MISCELLANEOUS) ×4
BAG COUNTER SPONGE SURGICOUNT (BAG) IMPLANT
BAG SPNG CNTER NS LX DISP (BAG)
BLADE SURG SZ11 CARB STEEL (BLADE) ×2 IMPLANT
CHLORAPREP W/TINT 26 (MISCELLANEOUS) ×2 IMPLANT
COVER SURGICAL LIGHT HANDLE (MISCELLANEOUS) ×2 IMPLANT
COVER TIP SHEARS 8 DVNC (MISCELLANEOUS) ×1 IMPLANT
COVER TIP SHEARS 8MM DA VINCI (MISCELLANEOUS) ×2
DRAPE ARM DVNC X/XI (DISPOSABLE) ×4 IMPLANT
DRAPE COLUMN DVNC XI (DISPOSABLE) ×1 IMPLANT
DRAPE CV SPLIT W-CLR ANES SCRN (DRAPES) ×2 IMPLANT
DRAPE DA VINCI XI ARM (DISPOSABLE) ×8
DRAPE DA VINCI XI COLUMN (DISPOSABLE) ×2
DRAPE ORTHO SPLIT 77X108 STRL (DRAPES) ×2
DRAPE SURG ORHT 6 SPLT 77X108 (DRAPES) ×1 IMPLANT
ELECT REM PT RETURN 15FT ADLT (MISCELLANEOUS) ×2 IMPLANT
GAUZE 4X4 16PLY ~~LOC~~+RFID DBL (SPONGE) ×1 IMPLANT
GLOVE BIOGEL PI IND STRL 7.0 (GLOVE) ×2 IMPLANT
GLOVE BIOGEL PI INDICATOR 7.0 (GLOVE) ×2
GLOVE SURG SS PI 7.0 STRL IVOR (GLOVE) ×4 IMPLANT
GOWN STRL REUS W/ TWL LRG LVL3 (GOWN DISPOSABLE) ×2 IMPLANT
GOWN STRL REUS W/ TWL XL LVL3 (GOWN DISPOSABLE) IMPLANT
GOWN STRL REUS W/TWL LRG LVL3 (GOWN DISPOSABLE) ×4
GOWN STRL REUS W/TWL XL LVL3 (GOWN DISPOSABLE)
IRRIG SUCT STRYKERFLOW 2 WTIP (MISCELLANEOUS)
IRRIGATION SUCT STRKRFLW 2 WTP (MISCELLANEOUS) IMPLANT
KIT BASIN OR (CUSTOM PROCEDURE TRAY) ×2 IMPLANT
KIT TURNOVER KIT A (KITS) IMPLANT
MESH 3DMAX 4X6 LT LRG (Mesh General) ×1 IMPLANT
MESH 3DMAX MID 4X6 LT LRG (Mesh General) IMPLANT
NDL INSUFFLATION 14GA 120MM (NEEDLE) ×1 IMPLANT
NEEDLE HYPO 22GX1.5 SAFETY (NEEDLE) ×2 IMPLANT
NEEDLE INSUFFLATION 14GA 120MM (NEEDLE) ×2 IMPLANT
PAD POSITIONING PINK XL (MISCELLANEOUS) ×2 IMPLANT
SEAL CANN UNIV 5-8 DVNC XI (MISCELLANEOUS) ×3 IMPLANT
SEAL XI 5MM-8MM UNIVERSAL (MISCELLANEOUS) ×8
SOL ANTI FOG 6CC (MISCELLANEOUS) ×1 IMPLANT
SOLUTION ANTI FOG 6CC (MISCELLANEOUS) ×1
SOLUTION ELECTROLUBE (MISCELLANEOUS) ×2 IMPLANT
SPIKE FLUID TRANSFER (MISCELLANEOUS) ×2 IMPLANT
SUT MNCRL AB 4-0 PS2 18 (SUTURE) ×2 IMPLANT
SUT VIC AB 3-0 SH 27 (SUTURE)
SUT VIC AB 3-0 SH 27XBRD (SUTURE) IMPLANT
SUT VLOC 180 2-0 6IN GS21 (SUTURE) IMPLANT
SUT VLOC 3-0 9IN GRN (SUTURE) IMPLANT
SYR 10ML LL (SYRINGE) ×2 IMPLANT
SYR 20ML LL LF (SYRINGE) ×2 IMPLANT
TOWEL OR 17X26 10 PK STRL BLUE (TOWEL DISPOSABLE) ×2 IMPLANT
TOWEL OR NON WOVEN STRL DISP B (DISPOSABLE) ×2 IMPLANT
TUBING INSUFFLATION 10FT LAP (TUBING) ×2 IMPLANT

## 2022-01-12 NOTE — Progress Notes (Signed)
Patient informed his surgeon has been delayed.  Patient voiced understanding. ?

## 2022-01-12 NOTE — Op Note (Signed)
Preop diagnosis: left inguinal hernia ? ?Postop diagnosis: left indirect inguinal hernia ? ?Procedure: Robotic  Left inguinal hernia repair with mesh ? ?Surgeon: Gurney Maxin, M.D. ? ?Asst: none ? ?Anesthesia: Gen.  ? ?Indications for procedure: [name] [name] is a 76 y.o. male with symptoms of pain and enlarging Left inguinal hernia(s). After discussing risks, alternatives and benefits he decided on robotic repair and was brought to day surgery for repair. ? ?Description of procedure: The patient was brought into the operative suite, placed supine. Anesthesia was administered with endotracheal tube. Patient was strapped in place. The patient was prepped and draped in the usual sterile fashion. ? ?A small transverse incision was made just left of midline about 20 cm cephalad of the pubic symphysis. A 76m trocar was used to gain access to the peritoneal cavity by optical entry technique. Pneumoperitoneum was applied with a high flow and low pressure. The laparoscope was reinserted to confirm position. ? ?Next Exparel:Marcaine mix was used for bilateral TAP blocks. 1 8 mm trocar was placed in the right mid abdomen. 1 8 mm trocar was placed in the left mid abdomen. The 5 mm trocar was upsized to an 8 mm trocar. The patient was placed in trendelenberg position. The robot was docked. ? ?On initial visualization , there was a large left inguinal hernia containing colon. A peritoneal flap was created on the left. This was continued medially to the pubic bone medially and laterally to muscles. Hernia sac was completely dissected out of the canal. Vas deference and contents of the cord were safely dissected away of the hernia sac.  ? ?A left mid weight large 3D max mesh was inserted and sutured with 2-0 vicryl medially to the lacunar ligament. The mesh was positioned flat and directly up against the direct and indirect areas. The peritoneal flap was sewn back up with running 3-0 strattafix suture.  The CO2 was  evacuated while watching to ensure the mesh did not migrate.. The anterior rectus fascia was closed with 0 vicryl in interrupted sutures and all skin incisions were closed with 4-0 monocryl subcu stitch. The patient awoke from anesthesia and was brought to PACU in stable condition. ? ?Findings: left indirect inguinal hernia ? ?Specimen: none ? ?Blood loss: 20 ml ? ?Local anesthesia: 50 ml Exparel:Marcaine mix ? ?Complications: none ? ?Implant: left large mid weight Bard 3D max mesh ? ?LGurney Maxin M.D. ?General, Bariatric, & Minimally Invasive Surgery ?[facility] [place]?5:08 PM ?01/12/2022 ? ?

## 2022-01-12 NOTE — Transfer of Care (Signed)
Immediate Anesthesia Transfer of Care Note ? ?Patient: [name] [name] ? ?Procedure(s) Performed: XI ROBOTIC ASSISTED LEFT INGUINAL HERNIA REPAIR WITH MESH (Left: Abdomen) ? ?Patient Location: PACU ? ?Anesthesia Type:General ? ?Level of Consciousness: drowsy and patient cooperative ? ?Airway & Oxygen Therapy: Patient Spontanous Breathing and Patient connected to face mask oxygen ? ?Post-op Assessment: Report given to RN and Post -op Vital signs reviewed and stable ? ?Post vital signs: Reviewed and stable ? ?Last Vitals:  ?Vitals Value Taken Time  ?BP 112/58 01/12/22 1723  ?Temp 36.4 ?C 01/12/22 1723  ?Pulse 64 01/12/22 1725  ?Resp 14 01/12/22 1725  ?SpO2 100 % 01/12/22 1725  ?Vitals shown include unvalidated device data. ? ?Last Pain:  ?Vitals:  ? 01/12/22 1723  ?TempSrc:   ?PainSc: 0-No pain  ?   ? ?  ? ?Complications: No notable events documented. ?

## 2022-01-12 NOTE — H&P (Signed)
?Subjective  ? ?Chief Complaint: New Consultation (Left Inguinal Hernia ) ? ? ?History of Present Illness: ?[name] [name]. is a 76 y.o. male who is seen today as an office consultation at the request of Dr. Marlou Porch for evaluation of New Consultation (Left Inguinal Hernia ) ?.  ? ?He first noticed the hernia 3 months ago. Symptoms are bloating and abdominal pain. He he denies nausea or vomiting or bowel habit change. ? ?He does not smoke ?He does not have diabetes ?He has no history of hernias ? ?Review of Systems: ?A complete review of systems was obtained from the patient. I have reviewed this information and discussed as appropriate with the patient. See HPI as well for other ROS. ? ?Review of Systems  ?Constitutional: Negative.  ?HENT: Negative.  ?Eyes: Negative.  ?Respiratory: Negative.  ?Cardiovascular: Negative.  ?Gastrointestinal: Negative.  ?Genitourinary: Negative.  ?Musculoskeletal: Negative.  ?Skin: Negative.  ?Neurological: Negative.  ?Endo/Heme/Allergies: Negative.  ?Psychiatric/Behavioral: Negative.  ? ? ?Medical History: ?Past Medical History:  ?Diagnosis Date  ? Arthritis  ? GERD (gastroesophageal reflux disease)  ? Hyperlipidemia  ? Hypertension  ? ?There is no problem list on file for this patient. ? ?Past Surgical History:  ?Procedure Laterality Date  ? COLONOSCOPY 06/2021  ? ? ?Allergies  ?Allergen Reactions  ? Benazepril Unknown  ? Other Other (See Comments)  ?Benazepril - unknown  ? Pravastatin Other (See Comments) and Muscle Pain  ?Body aches  ? ? Prednisone Headache and Other (See Comments)  ?Headaches ? ? Valsartan Headache and Other (See Comments)  ?headaches ? ? ?Current Outpatient Medications on File Prior to Visit  ?Medication Sig Dispense Refill  ? allopurinoL (ZYLOPRIM) 300 MG tablet 1 tablet  ? amLODIPine (NORVASC) 10 MG tablet 1 tablet  ? azelastine (ASTELIN) 137 mcg nasal spray 1-2 puffs  ? ezetimibe (ZETIA) 10 mg tablet 1 tablet  ? gabapentin (NEURONTIN) 300 MG capsule  TAKE ONE CAPSULE BY MOUTH every morning, NOON AND night]  ? hydroCHLOROthiazide (HYDRODIURIL) 25 MG tablet  ? irbesartan (AVAPRO) 300 MG tablet 1 tablet  ? linaCLOtide (LINZESS) 72 mcg capsule 1 capsule at least 30 minutes before the first meal of the day on an empty stomach  ? metoprolol succinate (TOPROL-XL) 50 MG XL tablet 1 tablet  ? omeprazole (PRILOSEC) 40 MG DR capsule 1 capsule 30 minutes before morning meal  ? rivaroxaban (XARELTO) 20 mg tablet 1 tablet with food  ? tamsulosin (FLOMAX) 0.4 mg capsule 1 capsule  ? traMADoL (ULTRAM) 50 mg tablet 1-2 tablets  ? acetaminophen (TYLENOL) 325 MG tablet 2 tablets  ? atorvastatin (LIPITOR) 10 MG tablet TAKE ONE TABLET BY MOUTH ONCE A WEEK  ? colchicine (COLCRYS) 0.6 mg tablet Take by mouth  ? folic acid/multivit-min/lutein (CENTRUM SILVER ORAL)  ? multivitamin (MULTIPLE VITAMINS) tablet Take by mouth  ? polyethylene glycol 3350 (MIRALAX ORAL) 1 scoop in 4 oz of fluid  ? promethazine (PHENERGAN) 25 MG tablet 1 tablet(s)  ? tadalafiL (CIALIS) 5 MG tablet 1 tablet  ? ?No current facility-administered medications on file prior to visit.  ? ?History reviewed. No pertinent family history.  ? ?Social History  ? ?Tobacco Use  ?Smoking Status Former  ? Types: Cigarettes  ?Smokeless Tobacco Never  ? ? ?Social History  ? ?Socioeconomic History  ? Marital status: Single  ?Tobacco Use  ? Smoking status: Former  ?Types: Cigarettes  ? Smokeless tobacco: Never  ?Vaping Use  ? Vaping Use: Unknown  ?Substance and Sexual Activity  ?  Alcohol use: Not Currently  ? Drug use: Never  ? ?Objective:  ? ?Vitals:  ?12/23/21 1020  ?BP: (!) 140/60  ?Pulse: 65  ?Temp: 36.3 ?C (97.3 ?F)  ?SpO2: 97%  ?Weight: (!) 108.2 kg (238 lb 9.6 oz)  ?Height: 177.8 cm ('5\' 10"'$ )  ? ?Body mass index is 34.24 kg/m?. ? ?Physical Exam ?Constitutional:  ?Appearance: Normal appearance.  ?HENT:  ?Head: Normocephalic and atraumatic.  ?Pulmonary:  ?Effort: Pulmonary effort is normal.  ?Musculoskeletal:  ?General: Normal  range of motion.  ?Cervical back: Normal range of motion.  ?Neurological:  ?General: No focal deficit present.  ?Mental Status: He is alert and oriented to person, place, and time. Mental status is at baseline.  ?Psychiatric:  ?Mood and Affect: Mood normal.  ?Behavior: Behavior normal.  ?Thought Content: Thought content normal.  ? ?Labs, Imaging and Diagnostic Testing: ?I reviewed CT from 11/01/2021 showing large left inguinal hernia containing sigmoid colon and evidence of intraabdominal diverticulitis of the sigmoid colon ?I reviewed notes by Ripley Fraise and Maury Dus and Candee Furbish ? ?Assessment and Plan:  ?Diagnoses and all orders for this visit: ? ?Non-recurrent unilateral inguinal hernia without obstruction or gangrene ? ?Chronic atrial fibrillation (CMS-HCC) ? ?We discussed etiology of hernias and how they can cause pain. We discussed options for inguinal hernia repair vs observation. We discussed details of the surgery of general anesthesia, surgical approach and incisions, dissecting the sack away from vas deference, testicular vessels and nerves and placement of mesh. We discussed risks of bleeding, infection, recurrence, injury to vas deference, testicular vessels, nerve injury, and chronic pain. He showed good understanding and wanted proceed with laparoscopic left inguinal hernia repair as outpatient.  ? ?Given his atrial fibrillation and Xarelto use, Dr. Marlou Porch has recommended a 3 day hold and agrees with plan for inguinal hernia repair.  ?

## 2022-01-12 NOTE — Discharge Instructions (Signed)
[facility] _______[facility]  UMBILICAL OR INGUINAL HERNIA REPAIR: POST OP INSTRUCTIONS  Always review your discharge instruction sheet given to you by the facility where your surgery was performed. IF YOU HAVE DISABILITY OR FAMILY LEAVE FORMS, YOU MUST BRING THEM TO THE OFFICE FOR PROCESSING.   DO NOT GIVE THEM TO YOUR DOCTOR.  1. A  prescription for pain medication may be given to you upon discharge.  Take your pain medication as prescribed, if needed.  If narcotic pain medicine is not needed, then you may take acetaminophen (Tylenol) or ibuprofen (Advil) as needed. 2. Take your usually prescribed medications unless otherwise directed. If you need a refill on your pain medication, please contact your pharmacy.  They will contact our office to request authorization. Prescriptions will not be filled after 5 pm or on week-ends. 3. You should follow a light diet the first 24 hours after arrival home, such as soup and crackers, etc.  Be sure to include lots of fluids daily.  Resume your normal diet the day after surgery. 4.Most patients will experience some swelling and bruising around the umbilicus or in the groin and scrotum.  Ice packs and reclining will help.  Swelling and bruising can take several days to resolve.  6. It is common to experience some constipation if taking pain medication after surgery.  Increasing fluid intake and taking a stool softener (such as Colace) will usually help or prevent this problem from occurring.  A mild laxative (Milk of Magnesia or Miralax) should be taken according to package directions if there are no bowel movements after 48 hours. 7. Unless discharge instructions indicate otherwise, you may remove your bandages 24-48 hours after surgery, and you may shower at that time.  You may have steri-strips (small skin tapes) in place directly over the incision.  These strips should be left on the skin for 7-10 days.  If your surgeon used skin glue on the  incision, you may shower in 24 hours.  The glue will flake off over the next 2-3 weeks.  Any sutures or staples will be removed at the office during your follow-up visit. 8. ACTIVITIES:  You may resume regular (light) daily activities beginning the next day--such as daily self-care, walking, climbing stairs--gradually increasing activities as tolerated.  You may have sexual intercourse when it is comfortable.  Refrain from any heavy lifting or straining until approved by your doctor.  a.You may drive when you are no longer taking prescription pain medication, you can comfortably wear a seatbelt, and you can safely maneuver your car and apply brakes. b.RETURN TO WORK:   _____________________________________________  9.You should see your doctor in the office for a follow-up appointment approximately 2-3 weeks after your surgery.  Make sure that you call for this appointment within a day or two after you arrive home to insure a convenient appointment time. 10.OTHER INSTRUCTIONS: _________________________    _____________________________________  WHEN TO CALL YOUR DOCTOR: Fever over 101.0 Inability to urinate Nausea and/or vomiting Extreme swelling or bruising Continued bleeding from incision. Increased pain, redness, or drainage from the incision  The clinic staff is available to answer your questions during regular business hours.  Please don't hesitate to call and ask to speak to one of the nurses for clinical concerns.  If you have a medical emergency, go to the nearest emergency room or call 911.  A surgeon from [facility] is always on call at the hospital   [address], [address],   [place], [place]  [address] ?  [address] [address], [place], [place]   [address] [phone] ? [phone] ? FAX [phone] Web site: [email or web address]  

## 2022-01-12 NOTE — Anesthesia Procedure Notes (Signed)
Procedure Name: Intubation ?Date/Time: 01/12/2022 3:57 PM ?Performed by: Montel Clock, CRNA ?Pre-anesthesia Checklist: Patient identified, Emergency Drugs available, Suction available, Patient being monitored and Timeout performed ?Patient Re-evaluated:Patient Re-evaluated prior to induction ?Oxygen Delivery Method: Circle system utilized ?Preoxygenation: Pre-oxygenation with 100% oxygen ?Induction Type: IV induction ?Ventilation: Mask ventilation without difficulty and Oral airway inserted - appropriate to patient size ?Laryngoscope Size: Mac and 3 ?Grade View: Grade II ?Tube type: Oral ?Tube size: 7.5 mm ?Number of attempts: 1 ?Airway Equipment and Method: Stylet ?Placement Confirmation: ETT inserted through vocal cords under direct vision, positive ETCO2 and breath sounds checked- equal and bilateral ?Secured at: 23 cm ?Tube secured with: Tape ?Dental Injury: Teeth and Oropharynx as per pre-operative assessment  ?Comments: MAC3 Grade 2b with downward laryngeal pressure. Glottis deep, MAC 4 more optimal? ? ? ? ? ?

## 2022-01-12 NOTE — Anesthesia Postprocedure Evaluation (Signed)
Anesthesia Post Note ? ?Patient: [name] [name] ? ?Procedure(s) Performed: XI ROBOTIC ASSISTED LEFT INGUINAL HERNIA REPAIR WITH MESH (Left: Abdomen) ? ?  ? ?Patient location during evaluation: PACU ?Anesthesia Type: General ?Level of consciousness: sedated ?Pain management: pain level controlled ?Vital Signs Assessment: post-procedure vital signs reviewed and stable ?Respiratory status: spontaneous breathing and respiratory function stable ?Cardiovascular status: stable ?Postop Assessment: no apparent nausea or vomiting ?Anesthetic complications: no ? ? ?No notable events documented. ? ?Last Vitals:  ?Vitals:  ? 01/12/22 1745 01/12/22 1800  ?BP: 136/73 130/73  ?Pulse: 61 62  ?Resp: 14 13  ?Temp:  36.4 ?C  ?SpO2: 96% 97%  ?  ?Last Pain:  ?Vitals:  ? 01/12/22 1800  ?TempSrc:   ?PainSc: 0-No pain  ? ? ?  ?  ?  ?  ?  ?  ? ?Merlinda Frederick ? ? ? ? ?

## 2022-01-13 ENCOUNTER — Encounter (HOSPITAL_COMMUNITY): Payer: Self-pay | Admitting: General Surgery

## 2022-03-10 DIAGNOSIS — K2930 Chronic superficial gastritis without bleeding: Secondary | ICD-10-CM | POA: Diagnosis not present

## 2022-03-10 DIAGNOSIS — R1012 Left upper quadrant pain: Secondary | ICD-10-CM | POA: Diagnosis not present

## 2022-03-10 DIAGNOSIS — K449 Diaphragmatic hernia without obstruction or gangrene: Secondary | ICD-10-CM | POA: Diagnosis not present

## 2022-03-12 DIAGNOSIS — K2930 Chronic superficial gastritis without bleeding: Secondary | ICD-10-CM | POA: Diagnosis not present

## 2022-04-07 DIAGNOSIS — I10 Essential (primary) hypertension: Secondary | ICD-10-CM | POA: Diagnosis not present

## 2022-04-07 DIAGNOSIS — E7800 Pure hypercholesterolemia, unspecified: Secondary | ICD-10-CM | POA: Diagnosis not present

## 2022-04-07 DIAGNOSIS — I480 Paroxysmal atrial fibrillation: Secondary | ICD-10-CM | POA: Diagnosis not present

## 2022-06-23 DIAGNOSIS — B0229 Other postherpetic nervous system involvement: Secondary | ICD-10-CM | POA: Diagnosis not present

## 2022-06-23 DIAGNOSIS — I129 Hypertensive chronic kidney disease with stage 1 through stage 4 chronic kidney disease, or unspecified chronic kidney disease: Secondary | ICD-10-CM | POA: Diagnosis not present

## 2022-06-23 DIAGNOSIS — M109 Gout, unspecified: Secondary | ICD-10-CM | POA: Diagnosis not present

## 2022-06-23 DIAGNOSIS — E7800 Pure hypercholesterolemia, unspecified: Secondary | ICD-10-CM | POA: Diagnosis not present

## 2022-06-23 DIAGNOSIS — I480 Paroxysmal atrial fibrillation: Secondary | ICD-10-CM | POA: Diagnosis not present

## 2022-06-23 DIAGNOSIS — N1831 Chronic kidney disease, stage 3a: Secondary | ICD-10-CM | POA: Diagnosis not present

## 2022-06-23 DIAGNOSIS — I420 Dilated cardiomyopathy: Secondary | ICD-10-CM | POA: Diagnosis not present

## 2022-06-23 DIAGNOSIS — Z0000 Encounter for general adult medical examination without abnormal findings: Secondary | ICD-10-CM | POA: Diagnosis not present

## 2022-06-28 ENCOUNTER — Ambulatory Visit: Payer: Medicare Other | Attending: Physician Assistant | Admitting: Physician Assistant

## 2022-06-28 ENCOUNTER — Encounter: Payer: Self-pay | Admitting: Physician Assistant

## 2022-06-28 VITALS — BP 130/60 | HR 57 | Ht 70.0 in | Wt 244.0 lb

## 2022-06-28 DIAGNOSIS — I447 Left bundle-branch block, unspecified: Secondary | ICD-10-CM | POA: Diagnosis not present

## 2022-06-28 DIAGNOSIS — I480 Paroxysmal atrial fibrillation: Secondary | ICD-10-CM | POA: Diagnosis not present

## 2022-06-28 DIAGNOSIS — I428 Other cardiomyopathies: Secondary | ICD-10-CM | POA: Diagnosis not present

## 2022-06-28 DIAGNOSIS — I10 Essential (primary) hypertension: Secondary | ICD-10-CM | POA: Diagnosis not present

## 2022-06-28 DIAGNOSIS — N1832 Chronic kidney disease, stage 3b: Secondary | ICD-10-CM | POA: Diagnosis not present

## 2022-06-28 DIAGNOSIS — E782 Mixed hyperlipidemia: Secondary | ICD-10-CM | POA: Diagnosis not present

## 2022-06-28 NOTE — Patient Instructions (Signed)
Medication Instructions:  Your physician recommends that you continue on your current medications as directed. Please refer to the Current Medication list given to you today. *If you need a refill on your cardiac medications before your next appointment, please call your pharmacy*   Lab Work: None Ordered   Testing/Procedures: None Ordered   Follow-Up: At [facility], you and your health needs are our priority.  As part of our continuing mission to provide you with exceptional heart care, we have created designated Provider Care Teams.  These Care Teams include your primary Cardiologist (physician) and Advanced Practice Providers (APPs -  Physician Assistants and Nurse Practitioners) who all work together to provide you with the care you need, when you need it.  We recommend signing up for the patient portal called "MyChart".  Sign up information is provided on this After Visit Summary.  MyChart is used to connect with patients for Virtual Visits (Telemedicine).  Patients are able to view lab/test results, encounter notes, upcoming appointments, etc.  Non-urgent messages can be sent to your provider as well.   To learn more about what you can do with MyChart, go to [email or web address].    Your next appointment:   6 month(s)  The format for your next appointment:   In Person  Provider:   Candee Furbish, MD     Other Instructions   Important Information About Sugar

## 2022-06-28 NOTE — Progress Notes (Signed)
Cardiology Office Note:    Date:  06/28/2022   ID:  [name] [name], DOB 04/15/1946, MRN [id number]  PCP:  Maury Dus, MD  [facility] Cardiologist:  Candee Furbish, MD  [facility] Electrophysiologist:  None   Chief Complaint: 6 months follow up   History of Present Illness:    [name] [name] is a 76 y.o. male with a hx of nonischemic cardiomyopathy, mild to moderate nonobstructive CAD, HTN, TIA x 2, PAF, LBBB, CKD IIIb and HLD seen for follow up.   Seen in Afib clinic 12/2017 for new brief afib. Noted LBBB (new vs old). Echo with LVEF of 45-50% and grade 1 DD. Follow up nuclear stress test which showed EF of 41%, peri-infarct ischemia. Underwent catheterization 12/2017 for further evaluation which showed mild nonobstructive CAD including 50% distal LCx stenosis.  Patient is here for follow-up.  Recent blood work by PCP showed increased serum creatinine to 2.1.  Few months ago it was 1.7-1.8 range.  He follows with [facility].  He denies chest pain, shortness of breath, orthopnea, PND, syncope, lower extremity edema or melena.  Compliant with medication.  Past Medical History:  Diagnosis Date   Arthritis    CKD (chronic kidney disease), stage III (HCC)    Coronary artery disease    GERD (gastroesophageal reflux disease)    Gout    Heart murmur    History of TIA (transient ischemic attack)    Hypertension    Inguinal hernia    Left   Kidney cysts    Left   LBBB (left bundle branch block)    Non-ischemic cardiomyopathy (HCC)    Paroxysmal atrial fibrillation ([place])    Sleep apnea     Past Surgical History:  Procedure Laterality Date   HERNIA REPAIR Right    RIGHT/LEFT HEART CATH AND CORONARY ANGIOGRAPHY N/A 02/10/2018   Procedure: RIGHT/LEFT HEART CATH AND CORONARY ANGIOGRAPHY;  Surgeon: Nelva Bush, MD;  Location: [place] [facility];  Service: Cardiovascular;  Laterality: N/A;   XI ROBOTIC ASSISTED INGUINAL HERNIA REPAIR WITH MESH Left 01/12/2022    Procedure: XI ROBOTIC ASSISTED LEFT INGUINAL HERNIA REPAIR WITH MESH;  Surgeon: Kinsinger, Arta Bruce, MD;  Location: [facility];  Service: General;  Laterality: Left;    Current Medications: Current Meds  Medication Sig   allopurinol (ZYLOPRIM) 300 MG tablet Take 300 mg by mouth daily.   amLODipine (NORVASC) 10 MG tablet Take 10 mg by mouth daily.   atorvastatin (LIPITOR) 10 MG tablet Take 10 mg by mouth every Sunday.   cetirizine (ZYRTEC ALLERGY) 10 MG tablet Take 1 tablet (10 mg total) by mouth daily.   colchicine 0.6 MG tablet Take 0.6 mg by mouth daily as needed (for gout).    ezetimibe (ZETIA) 10 MG tablet Take 10 mg by mouth daily.   fluticasone (FLONASE) 50 MCG/ACT nasal spray Place 1 spray into both nostrils at bedtime.   gabapentin (NEURONTIN) 300 MG capsule Take 300 mg by mouth 2 (two) times daily.   hydrochlorothiazide (HYDRODIURIL) 25 MG tablet Take 25 mg by mouth daily.   hydrocortisone 2.5 % cream Apply 1 application. topically 2 (two) times daily as needed (irritation).   irbesartan (AVAPRO) 300 MG tablet Take 300 mg by mouth every morning.   metoprolol succinate (TOPROL-XL) 50 MG 24 hr tablet Take 1 tablet (50 mg total) by mouth daily. Take with or immediately following a meal.   Multiple Vitamin (MULTIVITAMIN WITH MINERALS) TABS tablet Take 1 tablet by mouth daily.  omeprazole (PRILOSEC) 40 MG capsule Take 40 mg by mouth in the morning and at bedtime.   polyethylene glycol (MIRALAX / GLYCOLAX) 17 g packet Take 17 g by mouth daily as needed for moderate constipation.   promethazine (PHENERGAN) 25 MG tablet Take 25 mg by mouth every 6 (six) hours as needed for nausea or vomiting.   rivaroxaban (XARELTO) 20 MG TABS tablet Take 1 tablet (20 mg total) by mouth daily with supper.   tamsulosin (FLOMAX) 0.4 MG CAPS capsule Take 0.4 mg by mouth daily.   traMADol (ULTRAM) 50 MG tablet Take 50 mg by mouth every 6 (six) hours as needed for severe pain.     Allergies:   Benazepril,  Pravastatin, Prednisone, and Valsartan   Social History   Socioeconomic History   Marital status: Single    Spouse name: Not on file   Number of children: Not on file   Years of education: Not on file   Highest education level: Not on file  Occupational History   Not on file  Tobacco Use   Smoking status: Never   Smokeless tobacco: Never  Vaping Use   Vaping Use: Never used  Substance and Sexual Activity   Alcohol use: No   Drug use: No   Sexual activity: Not Currently  Other Topics Concern   Not on file  Social History Narrative   Not on file   Social Determinants of Health   Financial Resource Strain: Not on file  Food Insecurity: Not on file  Transportation Needs: Not on file  Physical Activity: Not on file  Stress: Not on file  Social Connections: Not on file     Family History: The patient's family history includes Cancer in his mother; Heart disease in his father; Hypertension in his brother, father, mother, and sister.    ROS:   Please see the history of present illness.    All other systems reviewed and are negative.   EKGs/Labs/Other Studies Reviewed:    The following studies were reviewed today:  Echo 12/2017 Study Conclusions  - Left ventricle: The cavity size was normal. Wall thickness was    increased in a pattern of moderate LVH. Systolic function was    mildly reduced. The estimated ejection fraction was in the range    of 45% to 50%. Diffuse hypokinesis. Doppler parameters are    consistent with abnormal left ventricular relaxation (grade 1    diastolic dysfunction).  - Ventricular septum: Septal motion showed &quot;bounce&quot;.  - Mitral valve: There was mild regurgitation.  - Left atrium: The atrium was mildly dilated.   RIGHT/LEFT HEART CATH AND CORONARY ANGIOGRAPHY  01/2018   Conclusion  Conclusions: Non-obstructive coronary artery disease, including 50% distal LCx stenosis. Normal left and right heart filling pressures. Upper  normal pulmonary artery pressure. Normal Fick cardiac output/index.   Recommendations: Medical therapy and risk factor modification to prevent progression of coronary artery disease. Continue medical management of non-ischemic cardiomyopathy.  Escalation of antihypertensive regimen will need to be considered. If no evidence of bleeding or vascular injury, rivaroxaban can be restarted tomorrow evening  EKG:  EKG is not  ordered today.   Recent Labs: 11/01/2021: ALT 11 01/02/2022: BUN 19; Creatinine, Ser 1.83; Hemoglobin 13.3; Platelets 210; Potassium 3.8; Sodium 138  Recent Lipid Panel No results found for: "CHOL", "TRIG", "HDL", "CHOLHDL", "VLDL", "LDLCALC", "LDLDIRECT"   Risk Assessment/Calculations:    CHA2DS2-VASc Score = 6   This indicates a 9.7% annual risk of stroke. The patient's score  is based upon: CHF History: 1 HTN History: 1 Diabetes History: 0 Stroke History: 2 Vascular Disease History: 1 Age Score: 1 Gender Score: 0      Physical Exam:    VS:  BP 130/60 (BP Location: Left Arm, Patient Position: Sitting, Cuff Size: Normal)   Pulse (!) 57   Ht '5\' 10"'$  (1.778 m)   Wt 244 lb (110.7 kg)   SpO2 97%   BMI 35.01 kg/m     Wt Readings from Last 3 Encounters:  06/28/22 244 lb (110.7 kg)  01/12/22 238 lb 1.6 oz (108 kg)  01/02/22 239 lb (108.4 kg)     GEN:  Well nourished, well developed in no acute distress HEENT: Normal NECK: No JVD; No carotid bruits LYMPHATICS: No lymphadenopathy CARDIAC: RRR, no murmurs, rubs, gallops RESPIRATORY:  Clear to auscultation without rales, wheezing or rhonchi  ABDOMEN: Soft, non-tender, non-distended MUSCULOSKELETAL:  No edema; No deformity  SKIN: Warm and dry NEUROLOGIC:  Alert and oriented x 3 PSYCHIATRIC:  Normal affect   ASSESSMENT AND PLAN:    Paroxysmal atrial fibrillation Regular rhythm on exam.  Continue metoprolol succinate 50 mg daily.  Continue Xarelto for anticoagulation.  No bleeding issue.  2.  Nonischemic  cardiomyopathy Mildly reduced LV function in 2019.  Continue beta-blocker and ARB.  On hydrochlorothiazide for diuretic.  3.  CKD stage IIIb -Recent blood work with increased renal function from baseline.  He follows with nephrology.  4.  Hypertensive heart disease -Blood pressure stable on current medications.  No change.  5.  Hyperlipidemia -Continue Lipitor and Zetia.  LDL 94.   Medication Adjustments/Labs and Tests Ordered: Current medicines are reviewed at length with the patient today.  Concerns regarding medicines are outlined above.  No orders of the defined types were placed in this encounter.  No orders of the defined types were placed in this encounter.   Patient Instructions  Medication Instructions:  Your physician recommends that you continue on your current medications as directed. Please refer to the Current Medication list given to you today. *If you need a refill on your cardiac medications before your next appointment, please call your pharmacy*   Lab Work: None Ordered   Testing/Procedures: None Ordered   Follow-Up: At [facility], you and your health needs are our priority.  As part of our continuing mission to provide you with exceptional heart care, we have created designated Provider Care Teams.  These Care Teams include your primary Cardiologist (physician) and Advanced Practice Providers (APPs -  Physician Assistants and Nurse Practitioners) who all work together to provide you with the care you need, when you need it.  We recommend signing up for the patient portal called "MyChart".  Sign up information is provided on this After Visit Summary.  MyChart is used to connect with patients for Virtual Visits (Telemedicine).  Patients are able to view lab/test results, encounter notes, upcoming appointments, etc.  Non-urgent messages can be sent to your provider as well.   To learn more about what you can do with MyChart, go to  [email or web address].    Your next appointment:   6 month(s)  The format for your next appointment:   In Person  Provider:   Candee Furbish, MD     Other Instructions   Important Information About Sugar         Signed, Leanor Kail, [place]  06/28/2022 2:15 PM    [facility]

## 2022-07-08 DIAGNOSIS — N1832 Chronic kidney disease, stage 3b: Secondary | ICD-10-CM | POA: Diagnosis not present

## 2022-07-09 DIAGNOSIS — Z23 Encounter for immunization: Secondary | ICD-10-CM | POA: Diagnosis not present

## 2022-07-27 DIAGNOSIS — K648 Other hemorrhoids: Secondary | ICD-10-CM | POA: Diagnosis not present

## 2022-07-27 DIAGNOSIS — K5900 Constipation, unspecified: Secondary | ICD-10-CM | POA: Diagnosis not present

## 2022-10-04 DIAGNOSIS — J309 Allergic rhinitis, unspecified: Secondary | ICD-10-CM | POA: Diagnosis not present

## 2022-10-04 DIAGNOSIS — B0229 Other postherpetic nervous system involvement: Secondary | ICD-10-CM | POA: Diagnosis not present

## 2022-10-04 DIAGNOSIS — G4762 Sleep related leg cramps: Secondary | ICD-10-CM | POA: Diagnosis not present

## 2022-10-04 DIAGNOSIS — I129 Hypertensive chronic kidney disease with stage 1 through stage 4 chronic kidney disease, or unspecified chronic kidney disease: Secondary | ICD-10-CM | POA: Diagnosis not present

## 2022-10-04 DIAGNOSIS — N1831 Chronic kidney disease, stage 3a: Secondary | ICD-10-CM | POA: Diagnosis not present

## 2022-10-22 ENCOUNTER — Ambulatory Visit
Admission: EM | Admit: 2022-10-22 | Discharge: 2022-10-22 | Disposition: A | Discharge status: Home / Self Care | Payer: Medicare Other | Attending: Physician Assistant | Admitting: Physician Assistant

## 2022-10-22 DIAGNOSIS — J069 Acute upper respiratory infection, unspecified: Secondary | ICD-10-CM | POA: Insufficient documentation

## 2022-10-22 DIAGNOSIS — Z1152 Encounter for screening for COVID-19: Secondary | ICD-10-CM | POA: Diagnosis not present

## 2022-10-22 DIAGNOSIS — R051 Acute cough: Secondary | ICD-10-CM | POA: Insufficient documentation

## 2022-10-22 MED ORDER — AMOXICILLIN 500 MG PO CAPS: 500.0000 mg | ORAL_CAPSULE | Freq: Three times a day (TID) | ORAL | 0 refills | Status: DC

## 2022-10-22 MED ORDER — DM-GUAIFENESIN ER 30-600 MG PO TB12: 1.0000 | ORAL_TABLET | Freq: Two times a day (BID) | ORAL | 0 refills | Status: DC

## 2022-10-22 NOTE — Discharge Instructions (Addendum)
COVID testing will be completed in 48 hours.  If you do not get a call from this office that indicates the test is negative.  Log onto MyChart to be the test results when they post in 48 hours.  Advised to take Mucinex DM every 12 hours as needed for cough and congestion. Advised take the Amoxil 500 mg every 8 hours to treat infection.  Advised follow-up PCP or return to urgent care if symptoms fail to improve.

## 2022-10-22 NOTE — ED Triage Notes (Signed)
Pt c/o productive cough with green/gray sputum and congestion since Wednesday. Took OTC meds with no relief. Denies fever.

## 2022-10-22 NOTE — ED Provider Notes (Signed)
[facility]    CSN: [id number] Arrival date & time: 10/22/22  1111      History   Chief Complaint Chief Complaint  Patient presents with   Cough    HPI [name] [name] is a 77 y.o. male.   77 year old male presents with cough, congestion, requesting COVID test.  Patient indicates for the past 3 days he has been having increasing upper respiratory congestion with rhinitis and postnasal drip.  Patient indicates that the production has been thick and green.  Patient also indicates having chest congestion with intermittent cough, production thick green and frequent.  Patient indicates he has not have any fever, chills, body aches or pain.  He denies having shortness of breath or wheezing.  He has been taking OTC preparations which is minimally controlling his symptoms.  He indicates he has not been around any friends or family that have been sick.  He does indicate being out and about in the public.  He is tolerating fluids well.   Cough Associated symptoms: rhinorrhea     Past Medical History:  Diagnosis Date   Arthritis    CKD (chronic kidney disease), stage III (HCC)    Coronary artery disease    GERD (gastroesophageal reflux disease)    Gout    Heart murmur    History of TIA (transient ischemic attack)    Hypertension    Inguinal hernia    Left   Kidney cysts    Left   LBBB (left bundle branch block)    Non-ischemic cardiomyopathy (HCC)    Paroxysmal atrial fibrillation (HCC)    Sleep apnea     Patient Active Problem List   Diagnosis Date Noted   Left inguinal hernia 11/03/2021   Left bundle branch block 11/03/2021   Chronic kidney disease, stage III (moderate) ([place]) 11/03/2021   Preop cardiovascular exam 11/03/2021   Shortness of breath 02/10/2018   Cardiomyopathy ([place]) 02/10/2018   Abnormal stress test 02/10/2018   Paroxysmal atrial fibrillation ([place]) 02/10/2018    Past Surgical History:  Procedure Laterality Date   HERNIA REPAIR Right     RIGHT/LEFT HEART CATH AND CORONARY ANGIOGRAPHY N/A 02/10/2018   Procedure: RIGHT/LEFT HEART CATH AND CORONARY ANGIOGRAPHY;  Surgeon: Nelva Bush, MD;  Location: [place] [facility];  Service: Cardiovascular;  Laterality: N/A;   XI ROBOTIC ASSISTED INGUINAL HERNIA REPAIR WITH MESH Left 01/12/2022   Procedure: XI ROBOTIC ASSISTED LEFT INGUINAL HERNIA REPAIR WITH MESH;  Surgeon: Kinsinger, Arta Bruce, MD;  Location: [facility];  Service: General;  Laterality: Left;       Home Medications    Prior to Admission medications   Medication Sig Start Date End Date Taking? Authorizing Provider  amoxicillin (AMOXIL) 500 MG capsule Take 1 capsule (500 mg total) by mouth 3 (three) times daily. 10/22/22  Yes Nyoka Lint, PA-C  dextromethorphan-guaiFENesin [facility] DM) 30-600 MG 12hr tablet Take 1 tablet by mouth 2 (two) times daily. 10/22/22  Yes Nyoka Lint, PA-C  allopurinol (ZYLOPRIM) 300 MG tablet Take 300 mg by mouth daily.    [provider]  amLODipine (NORVASC) 10 MG tablet Take 10 mg by mouth daily. 09/13/14   [provider]  atorvastatin (LIPITOR) 10 MG tablet Take 10 mg by mouth every Sunday. 12/07/21   [provider]  cetirizine (ZYRTEC ALLERGY) 10 MG tablet Take 1 tablet (10 mg total) by mouth daily. 08/02/20   Jaynee Eagles, PA-C  colchicine 0.6 MG tablet Take 0.6 mg by mouth daily as  needed (for gout).     [provider]  ezetimibe (ZETIA) 10 MG tablet Take 10 mg by mouth daily. 12/07/21   [provider]  fluticasone (FLONASE) 50 MCG/ACT nasal spray Place 1 spray into both nostrils at bedtime.    [provider]  gabapentin (NEURONTIN) 300 MG capsule Take 300 mg by mouth 2 (two) times daily.    [provider]  hydrochlorothiazide (HYDRODIURIL) 25 MG tablet Take 25 mg by mouth daily. 12/30/17   [provider]  hydrocortisone 2.5 % cream Apply 1 application. topically 2 (two) times daily as needed (irritation).    [provider]  irbesartan (AVAPRO) 300 MG tablet Take 300 mg by mouth every morning. 09/01/18   [provider]  metoprolol succinate (TOPROL-XL) 50 MG 24 hr tablet Take 1 tablet (50 mg total) by mouth daily. Take with or immediately following a meal. 11/03/21   Jerline Pain, MD  Multiple Vitamin (MULTIVITAMIN WITH MINERALS) TABS tablet Take 1 tablet by mouth daily.    [provider]  omeprazole (PRILOSEC) 40 MG capsule Take 40 mg by mouth in the morning and at bedtime.    [provider]  polyethylene glycol (MIRALAX / GLYCOLAX) 17 g packet Take 17 g by mouth daily as needed for moderate constipation.    [provider]  promethazine (PHENERGAN) 25 MG tablet Take 25 mg by mouth every 6 (six) hours as needed for nausea or vomiting.    [provider]  rivaroxaban (XARELTO) 20 MG TABS tablet Take 1 tablet (20 mg total) by mouth daily with supper. 11/17/21   Jerline Pain, MD  tamsulosin (FLOMAX) 0.4 MG CAPS capsule Take 0.4 mg by mouth daily.    [provider]  traMADol (ULTRAM) 50 MG tablet Take 50 mg by mouth every 6 (six) hours as needed for severe pain.    [provider]    Family History Family History  Problem Relation Age of Onset   Cancer Mother    Hypertension Mother    Heart disease Father    Hypertension Father    Hypertension Sister    Hypertension Brother     Social History Social History   Tobacco Use   Smoking status: Never   Smokeless tobacco: Never  Vaping Use   Vaping Use: Never used  Substance Use Topics   Alcohol use: No   Drug use: No     Allergies   Benazepril, Pravastatin, Prednisone, and Valsartan   Review of Systems Review of Systems  HENT:  Positive for postnasal drip and rhinorrhea.   Respiratory:  Positive for cough.      Physical Exam Triage Vital Signs ED Triage Vitals [10/22/22 1219]  Enc Vitals Group     BP (!) 150/78     Pulse Rate 67     Resp 18     Temp 98.7 F  (37.1 C)     Temp Source Oral     SpO2 97 %     Weight      Height      Head Circumference      Peak Flow      Pain Score 0     Pain Loc      Pain Edu?      Excl. in [place]?    No data found.  Updated Vital Signs BP (!) 150/78 (BP Location: Left Arm)   Pulse 67   Temp 98.7 F (37.1 C) (Oral)  Resp 18   SpO2 97%   Visual Acuity Right Eye Distance:   Left Eye Distance:   Bilateral Distance:    Right Eye Near:   Left Eye Near:    Bilateral Near:     Physical Exam Constitutional:      Appearance: Normal appearance.  HENT:     Right Ear: Tympanic membrane and ear canal normal.     Left Ear: Tympanic membrane and ear canal normal.     Mouth/Throat:     Mouth: Mucous membranes are moist.     Pharynx: Oropharynx is clear.  Cardiovascular:     Rate and Rhythm: Normal rate and regular rhythm.     Heart sounds: Normal heart sounds.  Pulmonary:     Effort: Pulmonary effort is normal.     Breath sounds: Normal breath sounds and air entry. No wheezing, rhonchi or rales.  Lymphadenopathy:     Cervical: No cervical adenopathy.  Neurological:     Mental Status: He is alert.      UC Treatments / Results  Labs (all labs ordered are listed, but only abnormal results are displayed) Labs Reviewed  SARS CORONAVIRUS 2 (TAT 6-24 HRS)    EKG   Radiology No results found.  Procedures Procedures (including critical care time)  Medications Ordered in UC Medications - No data to display  Initial Impression / Assessment and Plan / UC Course  I have reviewed the triage vital signs and the nursing notes.  Pertinent labs & imaging results that were available during my care of the patient were reviewed by me and considered in my medical decision making (see chart for details).    Plan: Diagnosis will be treated with the following: 1.  Screening for COVID-19: A.  Treatment may be modified depending on COVID test results. 2.  Upper respiratory tract infection: A.  Amoxil  500 mg every 8 hours for 7 days to treat infection. 3.  Acute cough: A.  Mucinex DM every 12 hours to treat cough and congestion. 4.  Advised follow-up PCP or return to urgent care as needed. Final Clinical Impressions(s) / UC Diagnoses   Final diagnoses:  Encounter for screening for COVID-19  Acute upper respiratory infection  Acute cough     Discharge Instructions      COVID testing will be completed in 48 hours.  If you do not get a call from this office that indicates the test is negative.  Log onto MyChart to be the test results when they post in 48 hours.  Advised to take Mucinex DM every 12 hours as needed for cough and congestion. Advised take the Amoxil 500 mg every 8 hours to treat infection.  Advised follow-up PCP or return to urgent care if symptoms fail to improve.    ED Prescriptions     Medication Sig Dispense Auth. Provider   dextromethorphan-guaiFENesin (MUCINEX DM) 30-600 MG 12hr tablet Take 1 tablet by mouth 2 (two) times daily. 20 tablet Nyoka Lint, PA-C   amoxicillin (AMOXIL) 500 MG capsule Take 1 capsule (500 mg total) by mouth 3 (three) times daily. 21 capsule Nyoka Lint, PA-C      PDMP not reviewed this encounter.   Nyoka Lint, PA-C 10/22/22 1248

## 2022-10-23 LAB — SARS CORONAVIRUS 2 (TAT 6-24 HRS): SARS Coronavirus 2: NEGATIVE

## 2022-11-25 ENCOUNTER — Other Ambulatory Visit: Payer: Self-pay | Admitting: Cardiology

## 2022-11-25 DIAGNOSIS — I480 Paroxysmal atrial fibrillation: Secondary | ICD-10-CM

## 2022-11-25 NOTE — Telephone Encounter (Signed)
Prescription refill request for Xarelto received.   Indication: afib  Last office visit: Bhagat, 06/28/2022 Weight: 110.7 kg  Age: 77 yo  Scr: 2.2,  10/04/2022 CrCl: 38 ml/min    Pt qualifies for a dose decrease.

## 2022-11-26 NOTE — Telephone Encounter (Signed)
Dose reduced.  Contacted patient and [facility]

## 2022-12-07 DIAGNOSIS — I480 Paroxysmal atrial fibrillation: Secondary | ICD-10-CM | POA: Diagnosis not present

## 2022-12-07 DIAGNOSIS — N1832 Chronic kidney disease, stage 3b: Secondary | ICD-10-CM | POA: Diagnosis not present

## 2022-12-07 DIAGNOSIS — I129 Hypertensive chronic kidney disease with stage 1 through stage 4 chronic kidney disease, or unspecified chronic kidney disease: Secondary | ICD-10-CM | POA: Diagnosis not present

## 2022-12-07 DIAGNOSIS — M1000 Idiopathic gout, unspecified site: Secondary | ICD-10-CM | POA: Diagnosis not present

## 2022-12-07 DIAGNOSIS — E785 Hyperlipidemia, unspecified: Secondary | ICD-10-CM | POA: Diagnosis not present

## 2022-12-30 DIAGNOSIS — J329 Chronic sinusitis, unspecified: Secondary | ICD-10-CM | POA: Diagnosis not present

## 2022-12-30 DIAGNOSIS — J343 Hypertrophy of nasal turbinates: Secondary | ICD-10-CM | POA: Diagnosis not present

## 2023-01-04 DIAGNOSIS — J343 Hypertrophy of nasal turbinates: Secondary | ICD-10-CM | POA: Diagnosis not present

## 2023-01-04 DIAGNOSIS — J3489 Other specified disorders of nose and nasal sinuses: Secondary | ICD-10-CM | POA: Diagnosis not present

## 2023-01-04 DIAGNOSIS — J342 Deviated nasal septum: Secondary | ICD-10-CM | POA: Diagnosis not present

## 2023-01-04 DIAGNOSIS — J329 Chronic sinusitis, unspecified: Secondary | ICD-10-CM | POA: Diagnosis not present

## 2023-01-06 DIAGNOSIS — N1832 Chronic kidney disease, stage 3b: Secondary | ICD-10-CM | POA: Diagnosis not present

## 2023-01-06 DIAGNOSIS — J019 Acute sinusitis, unspecified: Secondary | ICD-10-CM | POA: Diagnosis not present

## 2023-02-01 ENCOUNTER — Telehealth: Payer: Self-pay

## 2023-02-01 ENCOUNTER — Telehealth: Payer: Self-pay | Admitting: *Deleted

## 2023-02-01 NOTE — Telephone Encounter (Signed)
Patient with diagnosis of afib on Xarelto for anticoagulation.    Procedure: bilateral turbinate reduction and bilateral maxillary antrostomy   Date of procedure: TBD  CHA2DS2-VASc Score = 7  This indicates a 11.2% annual risk of stroke. The patient's score is based upon: CHF History: 1 HTN History: 1 Diabetes History: 0 Stroke History: 2 Vascular Disease History: 1 Age Score: 2 Gender Score: 0   CrCl 68mL/min using adj body weight Platelet count 210K  Per office protocol, patient can hold Xarelto for 2 days prior to procedure. Resume as soon as safely possible after given elevated CV risk.  **This guidance is not considered finalized until pre-operative APP has relayed final recommendations.**

## 2023-02-01 NOTE — Telephone Encounter (Signed)
   Name: [name] [name]  DOB: Sep 19, 1945  MRN: [id number]  Primary Cardiologist: Donato Schultz, MD   Preoperative team, please contact this patient and set up a phone call appointment for further preoperative risk assessment. Please obtain consent and complete medication review. Thank you for your help.  I confirm that guidance regarding antiplatelet and oral anticoagulation therapy has been completed and, if necessary, noted below. Per Pharm D: Patient with diagnosis of afib on Xarelto for anticoagulation.     Procedure: bilateral turbinate reduction and bilateral maxillary antrostomy    Date of procedure: TBD   CHA2DS2-VASc Score = 7  This indicates a 11.2% annual risk of stroke. The patient's score is based upon: CHF History: 1 HTN History: 1 Diabetes History: 0 Stroke History: 2 Vascular Disease History: 1 Age Score: 2 Gender Score: 0   CrCl 22mL/min using adj body weight Platelet count 210K   Per office protocol, patient can hold Xarelto for 2 days prior to procedure. Resume as soon as safely possible after given elevated CV risk.     Carlos Levering, NP 02/01/2023, 4:39 PM [facility]

## 2023-02-01 NOTE — Telephone Encounter (Signed)
Please advise holding Xarelto prior to bilateral turbinate reduction and bilateral maxillary antrostomy.  Thank you!  DW

## 2023-02-01 NOTE — Telephone Encounter (Signed)
Spoke with patient who is agreeable to do a tele visit on 6/13 a t 2:20 pm. Med rec and consent done.

## 2023-02-01 NOTE — Telephone Encounter (Signed)
  Patient Consent for Virtual Visit        [name] [name] has provided verbal consent on 02/01/2023 for a virtual visit (video or telephone).   CONSENT FOR VIRTUAL VISIT FOR:  [name] [name]  By participating in this virtual visit I agree to the following:  I hereby voluntarily request, consent and authorize [facility] and its employed or contracted physicians, physician assistants, nurse practitioners or other licensed health care professionals (the Practitioner), to provide me with telemedicine health care services (the "Services") as deemed necessary by the treating Practitioner. I acknowledge and consent to receive the Services by the Practitioner via telemedicine. I understand that the telemedicine visit will involve communicating with the Practitioner through live audiovisual communication technology and the disclosure of certain medical information by electronic transmission. I acknowledge that I have been given the opportunity to request an in-person assessment or other available alternative prior to the telemedicine visit and am voluntarily participating in the telemedicine visit.  I understand that I have the right to withhold or withdraw my consent to the use of telemedicine in the course of my care at any time, without affecting my right to future care or treatment, and that the Practitioner or I may terminate the telemedicine visit at any time. I understand that I have the right to inspect all information obtained and/or recorded in the course of the telemedicine visit and may receive copies of available information for a reasonable fee.  I understand that some of the potential risks of receiving the Services via telemedicine include:  Delay or interruption in medical evaluation due to technological equipment failure or disruption; Information transmitted may not be sufficient (e.g. poor resolution of images) to allow for appropriate medical decision making by the  Practitioner; and/or  In rare instances, security protocols could fail, causing a breach of personal health information.  Furthermore, I acknowledge that it is my responsibility to provide information about my medical history, conditions and care that is complete and accurate to the best of my ability. I acknowledge that Practitioner's advice, recommendations, and/or decision may be based on factors not within their control, such as incomplete or inaccurate data provided by me or distortions of diagnostic images or specimens that may result from electronic transmissions. I understand that the practice of medicine is not an exact science and that Practitioner makes no warranties or guarantees regarding treatment outcomes. I acknowledge that a copy of this consent can be made available to me via my patient portal [facility] MyChart), or I can request a printed copy by calling the office of [facility].    I understand that my insurance will be billed for this visit.   I have read or had this consent read to me. I understand the contents of this consent, which adequately explains the benefits and risks of the Services being provided via telemedicine.  I have been provided ample opportunity to ask questions regarding this consent and the Services and have had my questions answered to my satisfaction. I give my informed consent for the services to be provided through the use of telemedicine in my medical care

## 2023-02-01 NOTE — Telephone Encounter (Signed)
   Pre-operative Risk Assessment    Patient Name: [name] [name]  DOB: May 24, 1946 MRN: [id number]      Request for Surgical Clearance    Procedure:   Bilateral Turbinate Reduction and BLMaxillary Antrostomy  Date of Surgery:  Clearance TBD                                 Surgeon:  Dr. [name] Surgeon's Group or Practice Name:  [facility] Phone number:  [phone] Fax number:  [phone]   Type of Clearance Requested:   - Medical  - Pharmacy:  Hold Rivaroxaban (Xarelto) Not Indicated.   Type of Anesthesia:  Not Indicated   Additional requests/questions:    Signed, Emmit Pomfret   02/01/2023, 10:55 AM

## 2023-02-09 NOTE — Progress Notes (Signed)
Virtual Visit via Telephone Note   Because of Penny L Faley's co-morbid illnesses, he is at least at moderate risk for complications without adequate follow up.  This format is felt to be most appropriate for this patient at this time.  The patient did not have access to video technology/had technical difficulties with video requiring transitioning to audio format only (telephone).  All issues noted in this document were discussed and addressed.  No physical exam could be performed with this format.  Please refer to the patient's chart for his consent to telehealth for [facility].  Evaluation Performed:  Preoperative cardiovascular risk assessment _____________   Date:  02/09/2023   Patient ID:  [name] [name], DOB 1946-08-30, MRN [id number] Patient Location:  Home Provider location:   Office  Primary Care Provider:  Elias Else, MD (Inactive) Primary Cardiologist:  Donato Schultz, MD  Chief Complaint / Patient Profile   77 y.o. 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  History of Present Illness    [name] [name] is a 77 y.o. male who presents via audio/video conferencing for a telehealth visit today.  Pt was last seen in cardiology clinic on 06/28/22 by Chelsea Aus, PA.  At that time KLINTON ALDRIDGE was doing well.  The patient is now pending procedure as outlined above. Since his last visit, he  denies chest pain, shortness of breath, lower extremity edema, fatigue, palpitations, melena, hematuria, hemoptysis, diaphoresis, weakness, presyncope, syncope, orthopnea, and PND. He walks for exercise and does yard work and does not have any concerning cardiac symptoms.   Past Medical History    Past  Medical History:  Diagnosis Date   Arthritis    CKD (chronic kidney disease), stage III (HCC)    Coronary artery disease    GERD (gastroesophageal reflux disease)    Gout    Heart murmur    History of TIA (transient ischemic attack)    Hypertension    Inguinal hernia    Left   Kidney cysts    Left   LBBB (left bundle branch block)    Non-ischemic cardiomyopathy (HCC)    Paroxysmal atrial fibrillation (HCC)    Sleep apnea    Past Surgical History:  Procedure Laterality Date   HERNIA REPAIR Right    RIGHT/LEFT HEART CATH AND CORONARY ANGIOGRAPHY N/A 02/10/2018   Procedure: RIGHT/LEFT HEART CATH AND CORONARY ANGIOGRAPHY;  Surgeon: Yvonne Kendall, MD;  Location: [facility];  Service: Cardiovascular;  Laterality: N/A;   XI ROBOTIC ASSISTED INGUINAL HERNIA REPAIR WITH MESH Left 01/12/2022   Procedure: XI ROBOTIC ASSISTED LEFT INGUINAL HERNIA REPAIR WITH MESH;  Surgeon: Kinsinger, De Blanch, MD;  Location: [facility];  Service: General;  Laterality: Left;    Allergies  Allergies  Allergen Reactions   Benazepril     Pt is unaware of allergy    Pravastatin     Pt is unaware of allergy    Prednisone Nausea Only    Able to tolerate low doses   Valsartan     Pt is unaware of allergy     Home Medications    Prior to Admission medications   Medication Sig Start Date End Date Taking? Authorizing Provider  allopurinol (ZYLOPRIM) 300 MG tablet Take 300 mg by mouth  daily.    [provider]  amLODipine (NORVASC) 10 MG tablet Take 10 mg by mouth daily. 09/13/14   [provider]  amoxicillin (AMOXIL) 500 MG capsule Take 1 capsule (500 mg total) by mouth 3 (three) times daily. 10/22/22   Ellsworth Lennox, PA-C  atorvastatin (LIPITOR) 10 MG tablet Take 10 mg by mouth every Sunday. 12/07/21   [provider]  cetirizine (ZYRTEC ALLERGY) 10 MG tablet Take 1 tablet (10 mg total) by mouth daily. 08/02/20   Wallis Bamberg, PA-C  colchicine 0.6 MG tablet Take 0.6 mg by  mouth daily as needed (for gout).     [provider]  dextromethorphan-guaiFENesin (MUCINEX DM) 30-600 MG 12hr tablet Take 1 tablet by mouth 2 (two) times daily. 10/22/22   Ellsworth Lennox, PA-C  ezetimibe (ZETIA) 10 MG tablet Take 10 mg by mouth daily. 12/07/21   [provider]  fluticasone (FLONASE) 50 MCG/ACT nasal spray Place 1 spray into both nostrils at bedtime.    [provider]  gabapentin (NEURONTIN) 300 MG capsule Take 300 mg by mouth 2 (two) times daily.    [provider]  hydrochlorothiazide (HYDRODIURIL) 25 MG tablet Take 25 mg by mouth daily. 12/30/17   [provider]  hydrocortisone 2.5 % cream Apply 1 application. topically 2 (two) times daily as needed (irritation).    [provider]  irbesartan (AVAPRO) 300 MG tablet Take 300 mg by mouth every morning. 09/01/18   [provider]  metoprolol succinate (TOPROL-XL) 50 MG 24 hr tablet Take 1 tablet (50 mg total) by mouth daily. Take with or immediately following a meal. 11/03/21   Jake Bathe, MD  Multiple Vitamin (MULTIVITAMIN WITH MINERALS) TABS tablet Take 1 tablet by mouth daily.    [provider]  omeprazole (PRILOSEC) 40 MG capsule Take 40 mg by mouth in the morning and at bedtime.    [provider]  polyethylene glycol (MIRALAX / GLYCOLAX) 17 g packet Take 17 g by mouth daily as needed for moderate constipation.    [provider]  promethazine (PHENERGAN) 25 MG tablet Take 25 mg by mouth every 6 (six) hours as needed for nausea or vomiting.    [provider]  Rivaroxaban (XARELTO) 15 MG TABS tablet Take 1 tablet (15 mg total) by mouth daily with supper. 11/26/22   Jake Bathe, MD  tamsulosin (FLOMAX) 0.4 MG CAPS capsule Take 0.4 mg by mouth daily.    [provider]  traMADol (ULTRAM) 50 MG tablet Take 50 mg by mouth every 6 (six) hours as needed for severe pain.    [provider]    Physical Exam    Vital  Signs:  Constantin L Corrales does not have vital signs available for review today.  Given telephonic nature of communication, physical exam is limited. AAOx3. NAD. Normal affect.  Speech and respirations are unlabored.  Accessory Clinical Findings    None  Assessment & Plan    1.  Preoperative Cardiovascular Risk Assessment: According to the Revised Cardiac Risk Index (RCRI), his Perioperative Risk of Major Cardiac Event is (%): 6.6. His Functional Capacity in METs is: 7.59 according to the Duke Activity Status Index (DASI). The patient is doing well from a cardiac perspective. Therefore, based on ACC/AHA guidelines, the patient would be at acceptable risk for the planned procedure without further cardiovascular testing.   The patient was advised that if he develops new symptoms prior to surgery to contact our office to  arrange for a follow-up visit, and he verbalized understanding.  Per office protocol, patient can hold Xarelto for 2 days prior to procedure. Resume as soon as safely possible after given elevated CV risk.   A copy of this note will be routed to requesting surgeon.  Time:   Today, I have spent 10 minutes with the patient with telehealth technology discussing medical history, symptoms, and management plan.    Levi Aland, NP-C  02/10/2023, 2:24 PM [address], [address] Office [phone] Fax [phone]

## 2023-02-10 ENCOUNTER — Other Ambulatory Visit: Payer: Self-pay | Admitting: Otolaryngology

## 2023-02-10 ENCOUNTER — Encounter: Payer: Self-pay | Admitting: Nurse Practitioner

## 2023-02-10 ENCOUNTER — Ambulatory Visit: Payer: Medicare Other | Attending: Cardiology | Admitting: Nurse Practitioner

## 2023-02-10 DIAGNOSIS — Z01810 Encounter for preprocedural cardiovascular examination: Secondary | ICD-10-CM | POA: Diagnosis not present

## 2023-03-18 ENCOUNTER — Encounter: Payer: Self-pay | Admitting: Emergency Medicine

## 2023-03-18 ENCOUNTER — Ambulatory Visit
Admission: EM | Admit: 2023-03-18 | Discharge: 2023-03-18 | Disposition: A | Discharge status: Home / Self Care | Payer: Medicare Other | Attending: Family Medicine | Admitting: Family Medicine

## 2023-03-18 DIAGNOSIS — R0981 Nasal congestion: Secondary | ICD-10-CM | POA: Diagnosis not present

## 2023-03-18 DIAGNOSIS — Z1152 Encounter for screening for COVID-19: Secondary | ICD-10-CM | POA: Diagnosis not present

## 2023-03-18 NOTE — ED Provider Notes (Signed)
[facility]    CSN: [id number] Arrival date & time: 03/18/23  1058      History   Chief Complaint Chief Complaint  Patient presents with   Requesting COVID test    HPI AMMIEL [name] is a 77 y.o. male.   HPI Here for a covid swab. He has had sinus congestion and pressure for about 2-3 months and will be having sinus surgery 7/31. His daughter wanted him to come have a COVID test.  He is feeling fine, no f/c. No malaise or shortness of breath. No new URI symptoms in the last few days.   Past Medical History:  Diagnosis Date   Arthritis    CKD (chronic kidney disease), stage III (HCC)    Coronary artery disease    GERD (gastroesophageal reflux disease)    Gout    Heart murmur    History of TIA (transient ischemic attack)    Hypertension    Inguinal hernia    Left   Kidney cysts    Left   LBBB (left bundle branch block)    Non-ischemic cardiomyopathy (HCC)    Paroxysmal atrial fibrillation (HCC)    Sleep apnea     Patient Active Problem List   Diagnosis Date Noted   Left inguinal hernia 11/03/2021   Left bundle branch block 11/03/2021   Chronic kidney disease, stage III (moderate) (HCC) 11/03/2021   Preop cardiovascular exam 11/03/2021   Shortness of breath 02/10/2018   Cardiomyopathy (HCC) 02/10/2018   Abnormal stress test 02/10/2018   Paroxysmal atrial fibrillation (HCC) 02/10/2018    Past Surgical History:  Procedure Laterality Date   HERNIA REPAIR Right    RIGHT/LEFT HEART CATH AND CORONARY ANGIOGRAPHY N/A 02/10/2018   Procedure: RIGHT/LEFT HEART CATH AND CORONARY ANGIOGRAPHY;  Surgeon: Yvonne Kendall, MD;  Location: [facility];  Service: Cardiovascular;  Laterality: N/A;   XI ROBOTIC ASSISTED INGUINAL HERNIA REPAIR WITH MESH Left 01/12/2022   Procedure: XI ROBOTIC ASSISTED LEFT INGUINAL HERNIA REPAIR WITH MESH;  Surgeon: Kinsinger, De Blanch, MD;  Location: [facility];  Service: General;  Laterality: Left;       Home  Medications    Prior to Admission medications   Medication Sig Start Date End Date Taking? Authorizing Provider  allopurinol (ZYLOPRIM) 300 MG tablet Take 300 mg by mouth daily.   Yes [provider]  amLODipine (NORVASC) 10 MG tablet Take 10 mg by mouth daily. 09/13/14  Yes [provider]  amoxicillin (AMOXIL) 500 MG capsule Take 1 capsule (500 mg total) by mouth 3 (three) times daily. 10/22/22  Yes Ellsworth Lennox, PA-C  atorvastatin (LIPITOR) 10 MG tablet Take 10 mg by mouth every Sunday. 12/07/21  Yes [provider]  cetirizine (ZYRTEC ALLERGY) 10 MG tablet Take 1 tablet (10 mg total) by mouth daily. 08/02/20  Yes Wallis Bamberg, PA-C  colchicine 0.6 MG tablet Take 0.6 mg by mouth daily as needed (for gout).    Yes [provider]  dextromethorphan-guaiFENesin (MUCINEX DM) 30-600 MG 12hr tablet Take 1 tablet by mouth 2 (two) times daily. 10/22/22  Yes Ellsworth Lennox, PA-C  ezetimibe (ZETIA) 10 MG tablet Take 10 mg by mouth daily. 12/07/21  Yes [provider]  fluticasone (FLONASE) 50 MCG/ACT nasal spray Place 1 spray into both nostrils at bedtime.   Yes [provider]  gabapentin (NEURONTIN) 300 MG capsule Take 300 mg by mouth 2 (two) times daily.   Yes [provider]  hydrochlorothiazide (HYDRODIURIL) 25 MG  tablet Take 25 mg by mouth daily. 12/30/17  Yes [provider]  hydrocortisone 2.5 % cream Apply 1 application. topically 2 (two) times daily as needed (irritation).   Yes [provider]  irbesartan (AVAPRO) 300 MG tablet Take 300 mg by mouth every morning. 09/01/18  Yes [provider]  metoprolol succinate (TOPROL-XL) 50 MG 24 hr tablet Take 1 tablet (50 mg total) by mouth daily. Take with or immediately following a meal. 11/03/21  Yes Skains, Veverly Fells, MD  Multiple Vitamin (MULTIVITAMIN WITH MINERALS) TABS tablet Take 1 tablet by mouth daily.   Yes [provider]  omeprazole (PRILOSEC) 40 MG capsule  Take 40 mg by mouth in the morning and at bedtime.   Yes [provider]  polyethylene glycol (MIRALAX / GLYCOLAX) 17 g packet Take 17 g by mouth daily as needed for moderate constipation.   Yes [provider]  promethazine (PHENERGAN) 25 MG tablet Take 25 mg by mouth every 6 (six) hours as needed for nausea or vomiting.   Yes [provider]  Rivaroxaban (XARELTO) 15 MG TABS tablet Take 1 tablet (15 mg total) by mouth daily with supper. 11/26/22  Yes Jake Bathe, MD  tamsulosin (FLOMAX) 0.4 MG CAPS capsule Take 0.4 mg by mouth daily.   Yes [provider]  traMADol (ULTRAM) 50 MG tablet Take 50 mg by mouth every 6 (six) hours as needed for severe pain.   Yes [provider]    Family History Family History  Problem Relation Age of Onset   Cancer Mother    Hypertension Mother    Heart disease Father    Hypertension Father    Hypertension Sister    Hypertension Brother     Social History Social History   Tobacco Use   Smoking status: Never   Smokeless tobacco: Never  Vaping Use   Vaping status: Never Used  Substance Use Topics   Alcohol use: No   Drug use: No     Allergies   Benazepril, Pravastatin, Prednisone, and Valsartan   Review of Systems Review of Systems   Physical Exam Triage Vital Signs ED Triage Vitals  Encounter Vitals Group     BP 03/18/23 1204 134/80     Systolic BP Percentile --      Diastolic BP Percentile --      Pulse Rate 03/18/23 1204 75     Resp 03/18/23 1204 18     Temp 03/18/23 1204 97.8 F (36.6 C)     Temp Source 03/18/23 1204 Oral     SpO2 03/18/23 1204 95 %     Weight --      Height --      Head Circumference --      Peak Flow --      Pain Score 03/18/23 1205 0     Pain Loc --      Pain Education --      Exclude from Growth Chart --    No data found.  Updated Vital Signs BP 134/80 (BP Location: Right Arm)   Pulse 75   Temp 97.8 F (36.6 C) (Oral)   Resp 18   SpO2 95%    Visual Acuity Right Eye Distance:   Left Eye Distance:   Bilateral Distance:    Right Eye Near:   Left Eye Near:    Bilateral Near:     Physical Exam Vitals reviewed.  Constitutional:      General: He is not in  acute distress.    Appearance: He is not ill-appearing, toxic-appearing or diaphoretic.  HENT:     Mouth/Throat:     Mouth: Mucous membranes are moist.  Eyes:     Extraocular Movements: Extraocular movements intact.     Conjunctiva/sclera: Conjunctivae normal.     Pupils: Pupils are equal, round, and reactive to light.  Cardiovascular:     Rate and Rhythm: Normal rate and regular rhythm.     Heart sounds: No murmur heard. Pulmonary:     Effort: Pulmonary effort is normal. No respiratory distress.     Breath sounds: No stridor. No wheezing, rhonchi or rales.  Musculoskeletal:     Cervical back: Neck supple.  Lymphadenopathy:     Cervical: No cervical adenopathy.  Skin:    Coloration: Skin is not jaundiced or pale.  Neurological:     General: No focal deficit present.     Mental Status: He is alert and oriented to person, place, and time.  Psychiatric:        Behavior: Behavior normal.      UC Treatments / Results  Labs (all labs ordered are listed, but only abnormal results are displayed) Labs Reviewed  SARS CORONAVIRUS 2 (TAT 6-24 HRS)    EKG   Radiology No results found.  Procedures Procedures (including critical care time)  Medications Ordered in UC Medications - No data to display  Initial Impression / Assessment and Plan / UC Course  I have reviewed the triage vital signs and the nursing notes.  Pertinent labs & imaging results that were available during my care of the patient were reviewed by me and considered in my medical decision making (see chart for details).        COVID swab done. I would not think any tx indicated, as he is really asymptomatic.  Final Clinical Impressions(s) / UC Diagnoses   Final diagnoses:  Sinus  congestion     Discharge Instructions       You have been swabbed for COVID, and the test will result in the next 24 hours. Our staff will call you if positive.   If your MyChart sends you a message that you have a positive COVID test, please call in to our facility tomorrow.  We do not have a callback nurse on weekends.      ED Prescriptions   None    PDMP not reviewed this encounter.   Zenia Resides, MD 03/18/23 [phone]

## 2023-03-18 NOTE — Discharge Instructions (Signed)
  You have been swabbed for COVID, and the test will result in the next 24 hours. Our staff will call you if positive.   If your MyChart sends you a message that you have a positive COVID test, please call in to our facility tomorrow.  We do not have a callback nurse on weekends.

## 2023-03-18 NOTE — ED Triage Notes (Signed)
Patient requesting a COVID test, no direct exposure, having some sneezing, denies congestion and cough.  Patient currently does have a sinus infection.  Patient is taken Zyrtec and nasal spray.

## 2023-03-19 LAB — SARS CORONAVIRUS 2 (TAT 6-24 HRS): SARS Coronavirus 2: NEGATIVE

## 2023-03-22 NOTE — Pre-Procedure Instructions (Signed)
Surgical Instructions   Your procedure is scheduled on March 30, 2023. Report to [facility] Main Entrance "A" at 10:45 A.M., then check in with the Admitting office. Any questions or running late day of surgery: call [phone]  Questions prior to your surgery date: call [phone], Monday-Friday, 8am-4pm. If you experience any cold or flu symptoms such as cough, fever, chills, shortness of breath, etc. between now and your scheduled surgery, please notify us at the above number.     Remember:  Do not eat after midnight the night before your surgery   You may drink clear liquids until 9:45 AM the morning of your surgery.   Clear liquids allowed are: Water, Non-Citrus Juices (without pulp), Carbonated Beverages, Clear Tea, Black Coffee Only (NO MILK, CREAM OR POWDERED CREAMER of any kind), and Gatorade.    Take these medicines the morning of surgery with A SIP OF WATER: allopurinol (ZYLOPRIM)  amLODipine (NORVASC)  ezetimibe (ZETIA)  gabapentin (NEURONTIN)  metoprolol succinate (TOPROL-XL)  omeprazole (PRILOSEC)  tamsulosin [facility])    May take these medicines IF NEEDED: cetirizine (ZYRTEC ALLERGY)  colchicine  ipratropium (ATROVENT) nasal spray  linaclotide (LINZESS)  promethazine (PHENERGAN)  traMADol (ULTRAM)   Follow your surgeon's instructions on when to stop Rivaroxaban (XARELTO).  If no instructions were given by your surgeon then you will need to call the office to get those instructions.     One week prior to surgery, STOP taking any Aspirin (unless otherwise instructed by your surgeon) Aleve, Naproxen, Ibuprofen, Motrin, Advil, Goody's, BC's, all herbal medications, fish oil, and non-prescription vitamins.                     Do NOT Smoke (Tobacco/Vaping) for 24 hours prior to your procedure.  If you use a CPAP at night, you may bring your mask/headgear for your overnight stay.   You will be asked to remove any contacts, glasses, piercing's, hearing aid's,  dentures/partials prior to surgery. Please bring cases for these items if needed.    Patients discharged the day of surgery will not be allowed to drive home, and someone needs to stay with them for 24 hours.  SURGICAL WAITING ROOM VISITATION Patients may have no more than 2 support people in the waiting area - these visitors may rotate.   Pre-op nurse will coordinate an appropriate time for 1 ADULT support person, who may not rotate, to accompany patient in pre-op.  Children under the age of 45 must have an adult with them who is not the patient and must remain in the main waiting area with an adult.  If the patient needs to stay at the hospital during part of their recovery, the visitor guidelines for inpatient rooms apply.  Please refer to the [facility] website for the visitor guidelines for any additional information.   If you received a COVID test during your pre-op visit  it is requested that you wear a mask when out in public, stay away from anyone that may not be feeling well and notify your surgeon if you develop symptoms. If you have been in contact with anyone that has tested positive in the last 10 days please notify you surgeon.      Pre-operative CHG Bathing Instructions   You can play a key role in reducing the risk of infection after surgery. Your skin needs to be as free of germs as possible. You can reduce the number of germs on your skin by washing with CHG (chlorhexidine  gluconate) soap before surgery. CHG is an antiseptic soap that kills germs and continues to kill germs even after washing.   DO NOT use if you have an allergy to chlorhexidine/CHG or antibacterial soaps. If your skin becomes reddened or irritated, stop using the CHG and notify one of our RNs at [phone].              TAKE A SHOWER THE NIGHT BEFORE SURGERY AND THE DAY OF SURGERY    Please keep in mind the following:  DO NOT shave, including legs and underarms, 48 hours prior to surgery.   You may  shave your face before/day of surgery.  Place clean sheets on your bed the night before surgery Use a clean washcloth (not used since being washed) for each shower. DO NOT sleep with pet's night before surgery.  CHG Shower Instructions:  If you choose to wash your hair and private area, wash first with your normal shampoo/soap.  After you use shampoo/soap, rinse your hair and body thoroughly to remove shampoo/soap residue.  Turn the water OFF and apply half the bottle of CHG soap to a CLEAN washcloth.  Apply CHG soap ONLY FROM YOUR NECK DOWN TO YOUR TOES (washing for 3-5 minutes)  DO NOT use CHG soap on face, private areas, open wounds, or sores.  Pay special attention to the area where your surgery is being performed.  If you are having back surgery, having someone wash your back for you may be helpful. Wait 2 minutes after CHG soap is applied, then you may rinse off the CHG soap.  Pat dry with a clean towel  Put on clean pajamas    Additional instructions for the day of surgery: DO NOT APPLY any lotions, deodorants, cologne, or perfumes.   Do not wear jewelry or makeup Do not wear nail polish, gel polish, artificial nails, or any other type of covering on natural nails (fingers and toes) Do not bring valuables to the hospital. [facility] is not responsible for valuables/personal belongings. Put on clean/comfortable clothes.  Please brush your teeth.  Ask your nurse before applying any prescription medications to the skin.

## 2023-03-23 ENCOUNTER — Other Ambulatory Visit: Payer: Self-pay

## 2023-03-23 ENCOUNTER — Encounter (HOSPITAL_COMMUNITY): Payer: Self-pay

## 2023-03-23 ENCOUNTER — Encounter (HOSPITAL_COMMUNITY)
Admission: RE | Admit: 2023-03-23 | Discharge: 2023-03-23 | Disposition: A | Discharge status: Home / Self Care | Payer: Medicare Other | Source: Ambulatory Visit | Attending: Otolaryngology | Admitting: Otolaryngology

## 2023-03-23 VITALS — BP 151/64 | HR 60 | Temp 98.2°F | Resp 18 | Ht 70.0 in | Wt 250.0 lb

## 2023-03-23 DIAGNOSIS — N1832 Chronic kidney disease, stage 3b: Secondary | ICD-10-CM | POA: Diagnosis not present

## 2023-03-23 DIAGNOSIS — I2510 Atherosclerotic heart disease of native coronary artery without angina pectoris: Secondary | ICD-10-CM | POA: Insufficient documentation

## 2023-03-23 DIAGNOSIS — I129 Hypertensive chronic kidney disease with stage 1 through stage 4 chronic kidney disease, or unspecified chronic kidney disease: Secondary | ICD-10-CM | POA: Diagnosis not present

## 2023-03-23 DIAGNOSIS — Z01818 Encounter for other preprocedural examination: Secondary | ICD-10-CM | POA: Diagnosis present

## 2023-03-23 DIAGNOSIS — I428 Other cardiomyopathies: Secondary | ICD-10-CM | POA: Diagnosis not present

## 2023-03-23 DIAGNOSIS — I447 Left bundle-branch block, unspecified: Secondary | ICD-10-CM | POA: Insufficient documentation

## 2023-03-23 DIAGNOSIS — Z01810 Encounter for preprocedural cardiovascular examination: Secondary | ICD-10-CM | POA: Diagnosis not present

## 2023-03-23 DIAGNOSIS — Z01812 Encounter for preprocedural laboratory examination: Secondary | ICD-10-CM | POA: Diagnosis not present

## 2023-03-23 HISTORY — DX: Cerebral infarction, unspecified: I63.9

## 2023-03-23 LAB — CBC
HCT: 41.8 % (ref 39.0–52.0)
Hemoglobin: 13.5 g/dL (ref 13.0–17.0)
MCH: 30 pg (ref 26.0–34.0)
MCHC: 32.3 g/dL (ref 30.0–36.0)
MCV: 92.9 fL (ref 80.0–100.0)
Platelets: 237 10*3/uL (ref 150–400)
RBC: 4.5 MIL/uL (ref 4.22–5.81)
RDW: 13.9 % (ref 11.5–15.5)
WBC: 5.6 10*3/uL (ref 4.0–10.5)
nRBC: 0 % (ref 0.0–0.2)

## 2023-03-23 LAB — BASIC METABOLIC PANEL
Anion gap: 14 (ref 5–15)
BUN: 28 mg/dL — ABNORMAL HIGH (ref 8–23)
CO2: 23 mmol/L (ref 22–32)
Calcium: 9.1 mg/dL (ref 8.9–10.3)
Chloride: 103 mmol/L (ref 98–111)
Creatinine, Ser: 2.04 mg/dL — ABNORMAL HIGH (ref 0.61–1.24)
GFR, Estimated: 33 mL/min — ABNORMAL LOW (ref >60)
Glucose, Bld: 103 mg/dL — ABNORMAL HIGH (ref 70–99)
Potassium: 4 mmol/L (ref 3.5–5.1)
Sodium: 140 mmol/L (ref 135–145)

## 2023-03-23 NOTE — Progress Notes (Addendum)
PCP - Dr. Tally Joe Cardiologist - Dr. Donato Schultz (Last office visit 06/28/2022)  PPM/ICD - Denies Device Orders - n/a Rep Notified - n/a  Chest x-ray - Denies EKG - 03/23/2023 Stress Test - 01/20/2018 ECHO - 01/20/2018 Cardiac Cath - 02/10/2018 - no stents placed per pt  Sleep Study - +OSA. Pt occasionally wears CPAP. He is not aware of his pressure settings.  No DM  Last dose of GLP1 agonist- n/a GLP1 instructions: n/a  Blood Thinner Instructions: Pt instructed to stop Xarelto two days prior to surgery. His last dose will be July 28th. Aspirin Instructions: n/a  ERAS Protcol - Clear liquids until 0945 morning of surgery PRE-SURGERY Ensure or G2- n/a  COVID TEST- n/a   Anesthesia review: Yes. Cardiac Clearance obtained. Abnormal EKG review. Creatinine result 2.04.   Patient denies shortness of breath, fever, cough and chest pain at PAT appointment. Pt endorses sinus pressure but no other respiratory illness/infection in the last two months. He was placed on antibiotics two months ago, but all treatment was related to upcoming surgery.   All instructions explained to the patient, with a verbal understanding of the material. Patient agrees to go over the instructions while at home for a better understanding. Patient also instructed to self quarantine after being tested for COVID-19. The opportunity to ask questions was provided.

## 2023-03-24 NOTE — Anesthesia Preprocedure Evaluation (Addendum)
Anesthesia Evaluation  Patient identified by MRN, date of birth, ID band Patient awake    Reviewed: Allergy & Precautions, NPO status , Patient's Chart, lab work & pertinent test results  Airway Mallampati: II  TM Distance: >3 FB Neck ROM: Full    Dental  (+) Dental Advisory Given   Pulmonary sleep apnea    breath sounds clear to auscultation       Cardiovascular hypertension, Pt. on medications + CAD  + dysrhythmias  Rhythm:Regular Rate:Normal     Neuro/Psych CVA    GI/Hepatic Neg liver ROS,GERD  ,,  Endo/Other  negative endocrine ROS    Renal/GU CRFRenal disease     Musculoskeletal  (+) Arthritis ,    Abdominal   Peds  Hematology negative hematology ROS (+)   Anesthesia Other Findings   Reproductive/Obstetrics                             Anesthesia Physical Anesthesia Plan  ASA: 3  Anesthesia Plan: General   Post-op Pain Management: Tylenol PO (pre-op)*   Induction: Intravenous  PONV Risk Score and Plan: 2 and Dexamethasone, Ondansetron and Treatment may vary due to age or medical condition  Airway Management Planned: Oral ETT  Additional Equipment:   Intra-op Plan:   Post-operative Plan: Extubation in OR  Informed Consent: I have reviewed the patients History and Physical, chart, labs and discussed the procedure including the risks, benefits and alternatives for the proposed anesthesia with the patient or authorized representative who has indicated his/her understanding and acceptance.     Dental advisory given  Plan Discussed with: CRNA  Anesthesia Plan Comments: (PAT note by Antionette Poles, PA-C:  Patient follows with cardiology for history of nonischemic cardiomyopathy, mild to moderate nonobstructive CAD, HTN, TIA x 2, PAF, LBBB.  Seen by Eligha Bridegroom, NP on 02/10/2023 for preop evaluation.  Per note, "Preoperative Cardiovascular Risk Assessment: According to the  Revised Cardiac Risk Index (RCRI), his Perioperative Risk of Major Cardiac Event is (%): 6.6. His Functional Capacity in METs is: 7.59 according to the Duke Activity Status Index (DASI). The patient is doing well from a cardiac perspective. Therefore, based on ACC/AHA guidelines, the patient would be at acceptable risk for the planned procedure without further cardiovascular testing.  The patient was advised that if he develops new symptoms prior to surgery to contact our office to arrange for a follow-up visit, and he verbalized understanding. Per office protocol, patient can hold Xarelto for 2 days prior to procedure. Resume as soon as safely possible after given elevated CV risk." Patient reports last dose Xarelto 03/27/2023.  History of OSA on CPAP with inconsistent compliance.  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  Preop labs reviewed, creatinine elevated 2.04 this with history of CKD, otherwise unremarkable.  EKG 03/23/2023: Normal sinus rhythm. Left axis deviation. Left bundle branch block  Right/left heart cath 02/10/2018: Conclusions: 1. Non-obstructive coronary artery disease, including 50% distal LCx stenosis. 2. Normal left and right heart filling pressures. 3. Upper normal pulmonary artery pressure. 4. Normal Fick cardiac output/index.  Recommendations: 1. Medical therapy and risk factor modification to prevent progression of coronary artery disease. 2. Continue medical management of non-ischemic cardiomyopathy.  Escalation of antihypertensive regimen will need to be considered. 3. If no evidence of bleeding or vascular injury, rivaroxaban can be restarted tomorrow evening.  TTE 01/20/2018: - Left ventricle: The cavity size was normal. Wall thickness was  increased in a  pattern of moderate LVH. Systolic function was  mildly reduced. The estimated ejection fraction was in the range  of 45% to 50%. Diffuse hypokinesis. Doppler parameters are  consistent with abnormal  left ventricular relaxation (grade 1  diastolic dysfunction).  - Ventricular septum: Septal motion showed &quot;bounce&quot;.  - Mitral valve: There was mild regurgitation.  - Left atrium: The atrium was mildly dilated.    )        Anesthesia Quick Evaluation

## 2023-03-24 NOTE — Progress Notes (Signed)
Anesthesia Chart Review:  Patient follows with cardiology for history of nonischemic cardiomyopathy, mild to moderate nonobstructive CAD, HTN, TIA x 2, PAF, LBBB.  Seen by Eligha Bridegroom, NP on 02/10/2023 for preop evaluation.  Per note, "Preoperative Cardiovascular Risk Assessment: According to the Revised Cardiac Risk Index (RCRI), his Perioperative Risk of Major Cardiac Event is (%): 6.6. His Functional Capacity in METs is: 7.59 according to the Duke Activity Status Index (DASI). The patient is doing well from a cardiac perspective. Therefore, based on ACC/AHA guidelines, the patient would be at acceptable risk for the planned procedure without further cardiovascular testing.  The patient was advised that if he develops new symptoms prior to surgery to contact our office to arrange for a follow-up visit, and he verbalized understanding. Per office protocol, patient can hold Xarelto for 2 days prior to procedure. Resume as soon as safely possible after given elevated CV risk." Patient reports last dose Xarelto 03/27/2023.  History of OSA on CPAP with inconsistent compliance.  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  Preop labs reviewed, creatinine elevated 2.04 this with history of CKD, otherwise unremarkable.  EKG 03/23/2023: Normal sinus rhythm. Left axis deviation. Left bundle branch block  Right/left heart cath 02/10/2018: Conclusions: Non-obstructive coronary artery disease, including 50% distal LCx stenosis. Normal left and right heart filling pressures. Upper normal pulmonary artery pressure. Normal Fick cardiac output/index.   Recommendations: Medical therapy and risk factor modification to prevent progression of coronary artery disease. Continue medical management of non-ischemic cardiomyopathy.  Escalation of antihypertensive regimen will need to be considered. If no evidence of bleeding or vascular injury, rivaroxaban can be restarted tomorrow evening.  TTE 01/20/2018: - Left  ventricle: The cavity size was normal. Wall thickness was    increased in a pattern of moderate LVH. Systolic function was    mildly reduced. The estimated ejection fraction was in the range    of 45% to 50%. Diffuse hypokinesis. Doppler parameters are    consistent with abnormal left ventricular relaxation (grade 1    diastolic dysfunction).  - Ventricular septum: Septal motion showed &quot;bounce&quot;.  - Mitral valve: There was mild regurgitation.  - Left atrium: The atrium was mildly dilated.     Zannie Cove [facility] Stay [facility]/Anesthesiology Phone [phone] 03/24/2023 11:41 AM

## 2023-03-29 NOTE — Progress Notes (Signed)
Pt made aware of  surgery time change 1200-1411, arrival 1000, and to stop eating solid food by midnight and to stop drinking clear liquids by 0900.

## 2023-03-30 ENCOUNTER — Ambulatory Visit (HOSPITAL_BASED_OUTPATIENT_CLINIC_OR_DEPARTMENT_OTHER): Payer: Medicare Other | Admitting: Registered Nurse

## 2023-03-30 ENCOUNTER — Encounter (HOSPITAL_COMMUNITY)
Admission: RE | Disposition: A | Discharge status: Home / Self Care | Payer: Self-pay | Source: Home / Self Care | Attending: Otolaryngology

## 2023-03-30 ENCOUNTER — Other Ambulatory Visit: Payer: Self-pay

## 2023-03-30 ENCOUNTER — Ambulatory Visit (HOSPITAL_COMMUNITY): Payer: Medicare Other | Admitting: Physician Assistant

## 2023-03-30 ENCOUNTER — Ambulatory Visit: Payer: Medicare Other | Admitting: Cardiology

## 2023-03-30 ENCOUNTER — Ambulatory Visit (HOSPITAL_COMMUNITY)
Admission: RE | Admit: 2023-03-30 | Discharge: 2023-03-30 | Disposition: A | Discharge status: Home / Self Care | Payer: Medicare Other | Attending: Otolaryngology | Admitting: Otolaryngology

## 2023-03-30 DIAGNOSIS — I480 Paroxysmal atrial fibrillation: Secondary | ICD-10-CM | POA: Diagnosis not present

## 2023-03-30 DIAGNOSIS — I2510 Atherosclerotic heart disease of native coronary artery without angina pectoris: Secondary | ICD-10-CM

## 2023-03-30 DIAGNOSIS — N183 Chronic kidney disease, stage 3 unspecified: Secondary | ICD-10-CM | POA: Insufficient documentation

## 2023-03-30 DIAGNOSIS — J329 Chronic sinusitis, unspecified: Secondary | ICD-10-CM | POA: Insufficient documentation

## 2023-03-30 DIAGNOSIS — J343 Hypertrophy of nasal turbinates: Secondary | ICD-10-CM | POA: Insufficient documentation

## 2023-03-30 DIAGNOSIS — K219 Gastro-esophageal reflux disease without esophagitis: Secondary | ICD-10-CM | POA: Diagnosis not present

## 2023-03-30 DIAGNOSIS — I129 Hypertensive chronic kidney disease with stage 1 through stage 4 chronic kidney disease, or unspecified chronic kidney disease: Secondary | ICD-10-CM

## 2023-03-30 DIAGNOSIS — G4733 Obstructive sleep apnea (adult) (pediatric): Secondary | ICD-10-CM | POA: Diagnosis not present

## 2023-03-30 HISTORY — PX: SINUS ENDO WITH FUSION: SHX5329

## 2023-03-30 HISTORY — PX: NASAL TURBINATE REDUCTION: SHX2072

## 2023-03-30 SURGERY — SURGERY, PARANASAL SINUS, ENDOSCOPIC, WITH NASAL SEPTOPLASTY, TURBINOPLASTY, AND MAXILLARY SINUSOTOMY
Anesthesia: General | Laterality: Bilateral

## 2023-03-30 MED ORDER — ACETAMINOPHEN 500 MG PO TABS
1000.0000 mg | ORAL_TABLET | Freq: Once | ORAL | Status: AC
  Administered 2023-03-30: 1000 mg via ORAL
  Filled 2023-03-30: qty 2

## 2023-03-30 MED ORDER — AMOXICILLIN-POT CLAVULANATE 875-125 MG PO TABS: 1.0000 | ORAL_TABLET | Freq: Two times a day (BID) | ORAL | 0 refills | Status: AC

## 2023-03-30 MED ORDER — SODIUM CHLORIDE 0.9 % IV SOLN: INTRAVENOUS | Status: DC | PRN

## 2023-03-30 MED ORDER — ORAL CARE MOUTH RINSE: 15.0000 mL | Freq: Once | OROMUCOSAL | Status: AC

## 2023-03-30 MED ORDER — LIDOCAINE 2% (20 MG/ML) 5 ML SYRINGE
INTRAMUSCULAR | Status: DC | PRN
  Administered 2023-03-30: 60 mg via INTRAVENOUS

## 2023-03-30 MED ORDER — OXYMETAZOLINE HCL 0.05 % NA SOLN
NASAL | Status: AC
  Filled 2023-03-30: qty 30

## 2023-03-30 MED ORDER — MUPIROCIN 2 % EX OINT
TOPICAL_OINTMENT | CUTANEOUS | Status: AC
  Filled 2023-03-30: qty 22

## 2023-03-30 MED ORDER — SODIUM CHLORIDE 0.9 % IR SOLN
Status: DC | PRN
  Administered 2023-03-30: 2000 mL

## 2023-03-30 MED ORDER — LIDOCAINE-EPINEPHRINE 1 %-1:100000 IJ SOLN
INTRAMUSCULAR | Status: AC
  Filled 2023-03-30: qty 1

## 2023-03-30 MED ORDER — CEFAZOLIN SODIUM-DEXTROSE 2-4 GM/100ML-% IV SOLN
2.0000 g | INTRAVENOUS | Status: AC
  Administered 2023-03-30: 2 g via INTRAVENOUS
  Filled 2023-03-30: qty 100

## 2023-03-30 MED ORDER — DEXMEDETOMIDINE HCL IN NACL 200 MCG/50ML IV SOLN
INTRAVENOUS | Status: DC | PRN
  Administered 2023-03-30: 8 ug via INTRAVENOUS
  Administered 2023-03-30: 4 ug via INTRAVENOUS

## 2023-03-30 MED ORDER — MUPIROCIN 2 % EX OINT
TOPICAL_OINTMENT | CUTANEOUS | Status: DC | PRN
  Administered 2023-03-30: 1 via NASAL

## 2023-03-30 MED ORDER — LACTATED RINGERS IV SOLN: INTRAVENOUS | Status: DC

## 2023-03-30 MED ORDER — PROPOFOL 10 MG/ML IV BOLUS
INTRAVENOUS | Status: DC | PRN
Start: 2023-03-30 — End: 2023-03-30
  Administered 2023-03-30 (×2): 100 mg via INTRAVENOUS

## 2023-03-30 MED ORDER — SUGAMMADEX SODIUM 200 MG/2ML IV SOLN
INTRAVENOUS | Status: DC | PRN
  Administered 2023-03-30: 200 mg via INTRAVENOUS

## 2023-03-30 MED ORDER — GLYCOPYRROLATE 0.2 MG/ML IJ SOLN
INTRAMUSCULAR | Status: DC | PRN
Start: 2023-03-30 — End: 2023-03-30
  Administered 2023-03-30: .2 mg via INTRAVENOUS

## 2023-03-30 MED ORDER — LIDOCAINE-EPINEPHRINE 1 %-1:100000 IJ SOLN
INTRAMUSCULAR | Status: DC | PRN
  Administered 2023-03-30: 8 mL

## 2023-03-30 MED ORDER — ROCURONIUM BROMIDE 10 MG/ML (PF) SYRINGE
PREFILLED_SYRINGE | INTRAVENOUS | Status: DC | PRN
  Administered 2023-03-30: 60 mg via INTRAVENOUS

## 2023-03-30 MED ORDER — OXYMETAZOLINE HCL 0.05 % NA SOLN
NASAL | Status: DC | PRN
  Administered 2023-03-30: 1 via TOPICAL

## 2023-03-30 MED ORDER — EPHEDRINE SULFATE-NACL 50-0.9 MG/10ML-% IV SOSY
PREFILLED_SYRINGE | INTRAVENOUS | Status: DC | PRN
  Administered 2023-03-30: 10 mg via INTRAVENOUS

## 2023-03-30 MED ORDER — FENTANYL CITRATE (PF) 250 MCG/5ML IJ SOLN
INTRAMUSCULAR | Status: AC
  Filled 2023-03-30: qty 5

## 2023-03-30 MED ORDER — FENTANYL CITRATE (PF) 100 MCG/2ML IJ SOLN: 25.0000 ug | INTRAMUSCULAR | Status: DC | PRN

## 2023-03-30 MED ORDER — FENTANYL CITRATE (PF) 250 MCG/5ML IJ SOLN
INTRAMUSCULAR | Status: DC | PRN
  Administered 2023-03-30 (×3): 50 ug via INTRAVENOUS

## 2023-03-30 MED ORDER — PROMETHAZINE HCL 25 MG/ML IJ SOLN: 6.2500 mg | INTRAMUSCULAR | Status: DC | PRN

## 2023-03-30 MED ORDER — CHLORHEXIDINE GLUCONATE 0.12 % MT SOLN
15.0000 mL | Freq: Once | OROMUCOSAL | Status: AC
  Administered 2023-03-30: 15 mL via OROMUCOSAL
  Filled 2023-03-30: qty 15

## 2023-03-30 MED ORDER — PHENYLEPHRINE 80 MCG/ML (10ML) SYRINGE FOR IV PUSH (FOR BLOOD PRESSURE SUPPORT)
PREFILLED_SYRINGE | INTRAVENOUS | Status: DC | PRN
  Administered 2023-03-30 (×2): 160 ug via INTRAVENOUS

## 2023-03-30 MED ORDER — PHENYLEPHRINE HCL-NACL 20-0.9 MG/250ML-% IV SOLN
INTRAVENOUS | Status: DC | PRN
  Administered 2023-03-30: 20 ug/min via INTRAVENOUS

## 2023-03-30 MED ORDER — ONDANSETRON HCL 4 MG/2ML IJ SOLN
INTRAMUSCULAR | Status: DC | PRN
Start: 2023-03-30 — End: 2023-03-30
  Administered 2023-03-30: 4 mg via INTRAVENOUS

## 2023-03-30 MED ORDER — SODIUM CHLORIDE 0.9 % IV SOLN: INTRAVENOUS | Status: DC

## 2023-03-30 MED ORDER — DEXAMETHASONE SODIUM PHOSPHATE 10 MG/ML IJ SOLN
INTRAMUSCULAR | Status: DC | PRN
  Administered 2023-03-30: 10 mg via INTRAVENOUS

## 2023-03-30 SURGICAL SUPPLY — 51 items
ATTRACTOMAT 16X20 MAGNETIC DRP (DRAPES) IMPLANT
BAG COUNTER SPONGE SURGICOUNT (BAG) ×1 IMPLANT
BAG SPNG CNTER NS LX DISP (BAG) ×1
BLADE INF TURB ROT M4 2 5PK (BLADE) ×1 IMPLANT
BLADE RAD40 ROTATE 4M 4 5PK (BLADE) IMPLANT
BLADE RAD60 ROTATE M4 4 5PK (BLADE) IMPLANT
BLADE ROTATE TRICUT 4X13 M4 (BLADE) IMPLANT
BLADE SURG 15 STRL LF DISP TIS (BLADE) IMPLANT
BLADE SURG 15 STRL SS (BLADE)
BLADE TRICUT ROTATE M4 4 5PK (BLADE) ×1 IMPLANT
CANISTER SUCT 3000ML PPV (MISCELLANEOUS) ×2 IMPLANT
CLSR STERI-STRIP ANTIMIC 1/2X4 (GAUZE/BANDAGES/DRESSINGS) ×1 IMPLANT
COAGULATOR SUCT SWTCH 10FR 6 (ELECTROSURGICAL) IMPLANT
DRAPE HALF SHEET 40X57 (DRAPES) IMPLANT
DRESSING NASAL POPE 10X1.5X2.5 (GAUZE/BANDAGES/DRESSINGS) IMPLANT
DRSG NASAL POPE 10X1.5X2.5 (GAUZE/BANDAGES/DRESSINGS)
DRSG NASOPORE 8CM (GAUZE/BANDAGES/DRESSINGS) IMPLANT
DRSG TELFA 3X8 NADH STRL (GAUZE/BANDAGES/DRESSINGS) IMPLANT
ELECT REM PT RETURN 9FT ADLT (ELECTROSURGICAL) ×1
ELECTRODE REM PT RTRN 9FT ADLT (ELECTROSURGICAL) ×1 IMPLANT
FILTER ARTHROSCOPY CONVERTOR (FILTER) ×1 IMPLANT
GLOVE BIO SURGEON STRL SZ7.5 (GLOVE) ×2 IMPLANT
GOWN STRL REUS W/ TWL LRG LVL3 (GOWN DISPOSABLE) ×2 IMPLANT
GOWN STRL REUS W/TWL LRG LVL3 (GOWN DISPOSABLE) ×2
KIT BASIN OR (CUSTOM PROCEDURE TRAY) ×1 IMPLANT
KIT TURNOVER KIT B (KITS) ×1 IMPLANT
NDL HYPO 25GX1X1/2 BEV (NEEDLE) IMPLANT
NDL PRECISIONGLIDE 27X1.5 (NEEDLE) ×1 IMPLANT
NDL SPNL 22GX3.5 QUINCKE BK (NEEDLE) ×1 IMPLANT
NEEDLE HYPO 25GX1X1/2 BEV (NEEDLE) IMPLANT
NEEDLE PRECISIONGLIDE 27X1.5 (NEEDLE) ×1 IMPLANT
NEEDLE SPNL 22GX3.5 QUINCKE BK (NEEDLE) ×1 IMPLANT
NS IRRIG 1000ML POUR BTL (IV SOLUTION) ×1 IMPLANT
PAD ARMBOARD 7.5X6 YLW CONV (MISCELLANEOUS) ×2 IMPLANT
PAD ENT ADHESIVE 25PK (MISCELLANEOUS) ×1 IMPLANT
PATTIES SURGICAL .5 X3 (DISPOSABLE) ×1 IMPLANT
POSITIONER HEAD DONUT 9IN (MISCELLANEOUS) IMPLANT
SHEATH ENDOSCRUB 0 DEG (SHEATH) ×1 IMPLANT
SHEATH ENDOSCRUB 30 DEG (SHEATH) ×1 IMPLANT
SHEATH ENDOSCRUB 45 DEG (SHEATH) IMPLANT
SOL ANTI FOG 6CC (MISCELLANEOUS) ×1 IMPLANT
SUT ETHILON 3 0 PS 1 (SUTURE) IMPLANT
SWAB COLLECTION DEVICE MRSA (MISCELLANEOUS) IMPLANT
SYR 50ML SLIP (SYRINGE) IMPLANT
TOWEL GREEN STERILE FF (TOWEL DISPOSABLE) ×1 IMPLANT
TRACKER ENT INSTRUMENT (MISCELLANEOUS) IMPLANT
TRACKER ENT PATIENT (MISCELLANEOUS) IMPLANT
TRAY ENT MC OR (CUSTOM PROCEDURE TRAY) ×1 IMPLANT
TUBE CONNECTING 12X1/4 (SUCTIONS) ×1 IMPLANT
TUBING STRAIGHTSHOT EPS 5PK (TUBING) IMPLANT
WATER STERILE IRR 1000ML POUR (IV SOLUTION) ×1 IMPLANT

## 2023-03-30 NOTE — Brief Op Note (Signed)
03/30/2023  3:41 PM  PATIENT:  [name] [name]  77 y.o. male  PRE-OPERATIVE DIAGNOSIS:  Nasal turbinate hypertrophy Chronic sinusitis  POST-OPERATIVE DIAGNOSIS:  Nasal turbinate hypertrophy Chronic sinusitis  PROCEDURE:  Procedure(s): BILATERAL ANTERIOR ETHMOIDECTOMY, BILATERAL MAXILLARY ANTROSTOMY, AND FUSION IMAGE GUIDANCE (Bilateral) TURBINATE REDUCTION/SUBMUCOSAL RESECTION (Bilateral)  SURGEON:  Surgeons and Role:    [name], MD - Primary  PHYSICIAN ASSISTANT:   ASSISTANTS: none   ANESTHESIA:   general  EBL:  50 mL   BLOOD ADMINISTERED:none  DRAINS: none   LOCAL MEDICATIONS USED:  LIDOCAINE   SPECIMEN:  No Specimen  DISPOSITION OF SPECIMEN:  N/A  COUNTS:  YES  TOURNIQUET:  * No tourniquets in log *  DICTATION: .Note written in EPIC  PLAN OF CARE: Discharge to home after PACU  PATIENT DISPOSITION:  PACU - hemodynamically stable.   Delay start of Pharmacological VTE agent (>24hrs) due to surgical blood loss or risk of bleeding: no

## 2023-03-30 NOTE — Transfer of Care (Signed)
Immediate Anesthesia Transfer of Care Note  Patient: [name] [name]  Procedure(s) Performed: BILATERAL ANTERIOR ETHMOIDECTOMY, BILATERAL MAXILLARY ANTROSTOMY, FUSION IMAGE GUIDANCE (Bilateral) TURBINATE REDUCTION/SUBMUCOSAL RESECTION (Bilateral)  Patient Location: PACU  Anesthesia Type:General  Level of Consciousness: drowsy and patient cooperative  Airway & Oxygen Therapy: Patient Spontanous Breathing and Patient connected to face mask oxygen  Post-op Assessment: Report given to RN and Post -op Vital signs reviewed and stable  Post vital signs: Reviewed and stable  Last Vitals:  Vitals Value Taken Time  BP 138/78 03/30/23 1554  Temp    Pulse 69 03/30/23 1557  Resp 7 03/30/23 1557  SpO2 94 % 03/30/23 1557  Vitals shown include unfiled device data.  Last Pain:  Vitals:   03/30/23 1102  TempSrc:   PainSc: 0-No pain         Complications: No notable events documented.

## 2023-03-30 NOTE — H&P (Signed)
[name] [name] is an 77 y.o. male.   Chief Complaint: Chronic sinusitis and turbinate hypertrophy HPI: 77 year old male with history of chronic sinusitis and nasal obstruction not responding to medical therapy.  Past Medical History:  Diagnosis Date   Arthritis    CKD (chronic kidney disease), stage III (HCC)    Coronary artery disease    GERD (gastroesophageal reflux disease)    Gout    Heart murmur    History of TIA (transient ischemic attack)    Hypertension    Inguinal hernia    Left   Kidney cysts    Left   LBBB (left bundle branch block)    Non-ischemic cardiomyopathy (HCC)    Paroxysmal atrial fibrillation (HCC)    Sleep apnea 2018   CPAP occasionally   Stroke (HCC)    TIA    Past Surgical History:  Procedure Laterality Date   COLONOSCOPY     HERNIA REPAIR Right    Inguinal Hernia   RIGHT/LEFT HEART CATH AND CORONARY ANGIOGRAPHY N/A 02/10/2018   Procedure: RIGHT/LEFT HEART CATH AND CORONARY ANGIOGRAPHY;  Surgeon: Yvonne Kendall, MD;  Location: [facility] INVASIVE CV LAB;  Service: Cardiovascular;  Laterality: N/A;   XI ROBOTIC ASSISTED INGUINAL HERNIA REPAIR WITH MESH Left 01/12/2022   Procedure: XI ROBOTIC ASSISTED LEFT INGUINAL HERNIA REPAIR WITH MESH;  Surgeon: Kinsinger, De Blanch, MD;  Location: [facility] ORS;  Service: General;  Laterality: Left;    Family History  Problem Relation Age of Onset   Cancer Mother    Hypertension Mother    Heart disease Father    Hypertension Father    Hypertension Sister    Hypertension Brother    Social History:  reports that he has never smoked. He has never used smokeless tobacco. He reports that he does not drink alcohol and does not use drugs.  Allergies:  Allergies  Allergen Reactions   Benazepril     Pt is unaware of allergy    Pravastatin     Pt is unaware of allergy    Prednisone Nausea Only    Able to tolerate low doses   Valsartan     Pt is unaware of allergy     Medications Prior to Admission  Medication  Sig Dispense Refill   acetaminophen (TYLENOL) 325 MG tablet Take 325 mg by mouth every 6 (six) hours as needed for mild pain or fever.     allopurinol (ZYLOPRIM) 300 MG tablet Take 300 mg by mouth daily.     amLODipine (NORVASC) 10 MG tablet Take 10 mg by mouth daily.     atorvastatin (LIPITOR) 10 MG tablet Take 10 mg by mouth every Sunday.     cetirizine (ZYRTEC ALLERGY) 10 MG tablet Take 1 tablet (10 mg total) by mouth daily. (Patient taking differently: Take 10 mg by mouth daily as needed for rhinitis.) 30 tablet 0   colchicine 0.6 MG tablet Take 0.6 mg by mouth daily as needed (for gout).      ezetimibe (ZETIA) 10 MG tablet Take 10 mg by mouth daily.     fluticasone (FLONASE) 50 MCG/ACT nasal spray Place 1 spray into both nostrils at bedtime.     gabapentin (NEURONTIN) 300 MG capsule Take 300 mg by mouth 3 (three) times daily.     hydrochlorothiazide (HYDRODIURIL) 25 MG tablet Take 25 mg by mouth daily.  0   hydrocortisone 2.5 % cream Apply 1 application. topically 2 (two) times daily as needed (irritation).  ipratropium (ATROVENT) 0.03 % nasal spray Place 2 sprays into both nostrils at bedtime as needed for rhinitis.     irbesartan (AVAPRO) 300 MG tablet Take 300 mg by mouth every morning.     metoprolol succinate (TOPROL-XL) 50 MG 24 hr tablet Take 1 tablet (50 mg total) by mouth daily. Take with or immediately following a meal. 90 tablet 3   Multiple Vitamin (MULTIVITAMIN WITH MINERALS) TABS tablet Take 1 tablet by mouth daily.     omeprazole (PRILOSEC) 40 MG capsule Take 40 mg by mouth daily.     Rivaroxaban (XARELTO) 15 MG TABS tablet Take 1 tablet (15 mg total) by mouth daily with supper. 30 tablet 5   tamsulosin (FLOMAX) 0.4 MG CAPS capsule Take 0.4 mg by mouth daily.     dextromethorphan-guaiFENesin (MUCINEX DM) 30-600 MG 12hr tablet Take 1 tablet by mouth 2 (two) times daily. (Patient not taking: Reported on 03/18/2023) 20 tablet 0   linaclotide (LINZESS) 72 MCG capsule Take 72 mcg  by mouth daily as needed (Constipation).     polyethylene glycol (MIRALAX / GLYCOLAX) 17 g packet Take 17 g by mouth daily as needed for moderate constipation.     promethazine (PHENERGAN) 25 MG tablet Take 25 mg by mouth every 6 (six) hours as needed for nausea or vomiting.     traMADol (ULTRAM) 50 MG tablet Take 50 mg by mouth every 6 (six) hours as needed for severe pain.      No results found for this or any previous visit (from the past 48 hour(s)). No results found.  Review of Systems  All other systems reviewed and are negative.   Blood pressure (!) 153/75, pulse 63, temperature 98.3 F (36.8 C), temperature source Oral, resp. rate 17, height 5\' 10"  (1.778 m), weight 113.4 kg, SpO2 97%. Physical Exam Constitutional:      Appearance: Normal appearance. He is normal weight.  HENT:     Head: Normocephalic and atraumatic.     Right Ear: External ear normal.     Left Ear: External ear normal.     Nose: Nose normal.     Mouth/Throat:     Mouth: Mucous membranes are moist.     Pharynx: Oropharynx is clear.  Eyes:     Extraocular Movements: Extraocular movements intact.     Conjunctiva/sclera: Conjunctivae normal.     Pupils: Pupils are equal, round, and reactive to light.  Cardiovascular:     Rate and Rhythm: Normal rate.  Pulmonary:     Effort: Pulmonary effort is normal.  Musculoskeletal:     Cervical back: Normal range of motion.  Skin:    General: Skin is warm and dry.  Neurological:     General: No focal deficit present.     Mental Status: He is alert and oriented to person, place, and time.  Psychiatric:        Mood and Affect: Mood normal.        Behavior: Behavior normal.        Thought Content: Thought content normal.        Judgment: Judgment normal.      Assessment/Plan Chronic sinusitis and turbinate hypertrophy  To OR for bilateral endoscopic sinus surgery and turbinate reduction.  [name], MD 03/30/2023, 1:57 PM

## 2023-03-30 NOTE — Op Note (Signed)
PREOPERATIVE DIAGNOSIS:  Chronic sinusitis and turbinate hypertrophy   POSTOPERATIVE DIAGNOSIS:  Chronic sinusitis and turbinate hypertrophy   PROCEDURE:  Bilateral anterior ethmoidectomy, bilateral maxillary antrostomy, Fusion image guidance, and bilateral turbinate reduction   SURGEON:  [name], MD   ANESTHESIA:  General endotracheal anesthesia   COMPLICATIONS:  None   INDICATIONS:  The patient is a 77 year old male with a history of chronic sinusitis and nasal obstruction with limited response to medical therapy.  He presents to the operating room for surgical management.   FINDINGS:  Edema through the anterior ethmoids.  Enlarged turbinates.  Septum deviated somewhat to the left.   DESCRIPTION OF PROCEDURE:  The patient was identified in the holding room, informed consent having been obtained including discussion of risks, benefits and alternatives, the patient was brought to the operative suite and put the operative table in the supine position.  Anesthesia was induced and the patient was intubated by the anesthesia team without difficulty.  The eyes were lubricated and the Fusion antenna was placed.  The patient was given intravenous antibiotics during the case.  The face was prepped and draped in sterile fashion.  The patient was registered to the Fusion system in the standard fashion.  The eyes were taped closed and Afrin-soaked pledgets were placed in both sides of the nose.  After registering instruments, the right-sided pledgets were removed and the nasal passage was inspected with a straight telescope.  The lateral nasal wall was injected with local anesthetic.  The middle turbinate was medialized and the uncinate process was elevated from the side wall.  The uncinate was divided and removed using the microdebrider.  An angled telescope was used to evaluate the maxillary opening that was widened posteriorly and inferiorly.  The straight telescope was then used to evaluate the  ethmoid.  The ethmoid bulla and anterior cells were then removed using the microdebrider.  Image guidance was used.  At this point, Afrin-soaked pledgets were placed in the sinuses.  Attention was then directed to the left side where the same procedures were performed including injection of local anesthetic, removal of the uncinate process, widening of the maxillary opening, removal of anterior ethmoid air cells, and placement of Afrin-soaked pledgets.  The inferior turbinates were injected with local anesthetic.  An incision was made at the anterior head of each inferior turbinate and soft tissues were elevated off of the underlying bone using a freer elevator.  The microdebrider was then used with the turbinate blade to remove submucosal tissue keeping the overlying mucosa and underlying bone intact.  Soft tissues were then redraped and the turbinates out-fractured.  The nasal passage was suctioned.  Pledgets were then removed and the sinuses inspected.  Some additional minor work was done to clear small fragments and to smooth surfaces.  Half of a Nasopore pack was then placed in each ethmoid sinus coated with mupirocin ointment.  Each pack was saturated with saline.   Pledgets were placed back in the nose for wake-up.  Drapes were removed and the patient was cleaned off.  The patient was returned to anesthesia for wakeup, extubated, and taken to the recovery room in stable condition.

## 2023-03-30 NOTE — Anesthesia Procedure Notes (Signed)
Procedure Name: Intubation Date/Time: 03/30/2023 2:20 PM  Performed by: Alphonse Guild, RNPre-anesthesia Checklist: Patient identified, Emergency Drugs available, Suction available and Patient being monitored Patient Re-evaluated:Patient Re-evaluated prior to induction Oxygen Delivery Method: Circle system utilized Preoxygenation: Pre-oxygenation with 100% oxygen Induction Type: IV induction Ventilation: Mask ventilation without difficulty and Oral airway inserted - appropriate to patient size Laryngoscope Size: Glidescope and 4 Grade View: Grade II Tube type: Oral Tube size: 7.5 mm Number of attempts: 2 (first attempt DL with MAC 4 grade III view) Airway Equipment and Method: Stylet and Oral airway Placement Confirmation: ETT inserted through vocal cords under direct vision, positive ETCO2 and breath sounds checked- equal and bilateral Secured at: 23 cm Tube secured with: Tape Dental Injury: Teeth and Oropharynx as per pre-operative assessment  Difficulty Due To: Difficult Airway- due to large tongue Comments: Intubation by Garnet Koyanagi, SRNA

## 2023-03-31 ENCOUNTER — Encounter (HOSPITAL_COMMUNITY): Payer: Self-pay | Admitting: Otolaryngology

## 2023-03-31 NOTE — Anesthesia Postprocedure Evaluation (Signed)
Anesthesia Post Note  Patient: [name] [name]  Procedure(s) Performed: BILATERAL ANTERIOR ETHMOIDECTOMY, BILATERAL MAXILLARY ANTROSTOMY, FUSION IMAGE GUIDANCE (Bilateral) TURBINATE REDUCTION/SUBMUCOSAL RESECTION (Bilateral)     Patient location during evaluation: PACU Anesthesia Type: General Level of consciousness: awake and alert Pain management: pain level controlled Vital Signs Assessment: post-procedure vital signs reviewed and stable Respiratory status: spontaneous breathing, nonlabored ventilation, respiratory function stable and patient connected to nasal cannula oxygen Cardiovascular status: blood pressure returned to baseline and stable Postop Assessment: no apparent nausea or vomiting Anesthetic complications: no   No notable events documented.  Last Vitals:  Vitals:   03/30/23 1630 03/30/23 1645  BP: (!) 143/79 (!) 140/79  Pulse: 64 (!) 59  Resp: 19 (!) 22  Temp:  36.6 C  SpO2: 95% 93%    Last Pain:  Vitals:   03/30/23 1645  TempSrc:   PainSc: 0-No pain                 Mariann Barter

## 2023-04-13 NOTE — Progress Notes (Signed)
Cardiology Office Note:  .   Date:  04/15/2023  ID:  [name] [name], DOB 1945-09-04, MRN [id number] PCP: [name] Joe, MD  [name] [name] Providers Cardiologist:  [name] Schultz, MD    Patient Profile: .      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  Noted LBBB.  Echo with LVEF 45 to 50% and grade 1 DD.  Follow-up nuclear stress test showed EF 41%, peri-infarct ischemia.  He underwent catheterization 12/2017 for further evaluation which showed mild nonobstructive CAD including 50% distal LCx stenosis.  His chronic [name] disease is followed by [name] [name].  He underwent cardiac risk evaluation for sinus surgery on 02/10/23 at which time he was asymptomatic from a cardiac standpoint.        History of Present Illness: .   [name] [name] is a very pleasant 77 y.o. male who is here today for follow-up. He reports he is feeling well following sinus surgery, is glad he did it. He denies chest pain, shortness of breath, lower extremity edema, fatigue, palpitations, melena, presyncope, syncope, orthopnea, and PND. Due to see nephrologist in January, [name] [name]. Admits to weight gain from eating peanuts and chocolate. No specific cardiac complaints. He does not like taking multiple medications.  We had a lengthy discussion about healthy diet, weight loss, and exercise.  ROS: See HPI       Studies Reviewed: Marland Kitchen   EKG Interpretation Date/Time:  Friday April 15 2023 15:43:56 EDT Ventricular Rate:  64 PR Interval:  178 QRS Duration:  144 QT Interval:  450 QTC Calculation: 464 R Axis:   124  Text Interpretation: Normal sinus rhythm Left Left bundle branch block Nonspecific ST abnormality Confirmed by [name] [name] [id number]) on 04/15/2023  3:52:20 PM     Risk Assessment/Calculations:    CHA2DS2-VASc Score = 7   This indicates a 11.2% annual risk of stroke. The patient's score is based upon: CHF History: 1 HTN History: 1 Diabetes History: 0 Stroke History: 2 Vascular Disease History: 1 Age Score: 2 Gender Score: 0            Physical Exam:   VS:  BP 110/63   Pulse 70   Ht 5\' 10"  (1.778 m)   Wt 255 lb (115.7 kg)   SpO2 96%   BMI 36.59 kg/m    Wt Readings from Last 3 Encounters:  04/15/23 255 lb (115.7 kg)  03/30/23 250 lb (113.4 kg)  03/23/23 250 lb (113.4 kg)    GEN: Well nourished, well developed in no acute distress NECK: No JVD; No carotid bruits CARDIAC: RRR, no murmurs, rubs, gallops RESPIRATORY:  Clear to auscultation without rales, wheezing or rhonchi  ABDOMEN: Soft, non-tender, non-distended, protuberant EXTREMITIES:  No edema; No deformity     ASSESSMENT AND PLAN: .    PAF on chronic anticoagulation: EKG reveals NSR at 70 bpm.  No tachy-palpitations. He is asymptomatic. No bleeding concerns. Xarelto dose was decreased 10/2022 due to CrCl 38 ml/min. His creatinine has fluctuated between 2.2 and 2.0. We will recheck renal function today to ensure adequate dose. Continue OAC for stroke prevention for CHA2DS2-VASc score of 7.  LBBB: EKG reveals left bundle branch block.  We will get echocardiogram to evaluate heart function.  Hypertension: BP is well controlled.  He asks if multiple blood pressure medications are necessary.  We reviewed guidelines for appropriate BP < 120/80. Encouraged him to work on diet, 150 minutes of moderate intensity exercise each week, and weight loss and that if BP continues to improve, we can consider reducing anti-hypertensive therapy.   CAD without angina: Nonobstructive coronary artery disease including 50% distal LCx stenosis on cath 01/2018. He denies chest pain, dyspnea, or other symptoms concerning for angina.  No indication for further ischemic evaluation at this time.  Emphasized the importance of secondary prevention including LDL goal less than 70, good blood pressure control, weight loss, regular exercise, and healthy diet.  Hyperlipidemia LDL goal < 70: LDL 94 on 06/23/22. He takes atorvastatin 10 mg once weekly, takes Zetia once daily. We will recheck fasting lipid panel along with apolipoprotein b and Lp(a) for further risk stratification.  He is agreeable to consider alternative cholesterol medications should lab results indicate high risk.   CKD: SCr 2.04 on 03/23/23.  He is due to see nephrologist in January.  We will recheck renal function today.       Dispo: 3 months with me  Signed, [name] Bridegroom, NP-C

## 2023-04-15 ENCOUNTER — Encounter: Payer: Self-pay | Admitting: Nurse Practitioner

## 2023-04-15 ENCOUNTER — Ambulatory Visit: Payer: Medicare Other | Admitting: Nurse Practitioner

## 2023-04-15 VITALS — BP 110/63 | HR 70 | Ht 70.0 in | Wt 255.0 lb

## 2023-04-15 DIAGNOSIS — I480 Paroxysmal atrial fibrillation: Secondary | ICD-10-CM | POA: Diagnosis not present

## 2023-04-15 DIAGNOSIS — I447 Left bundle-branch block, unspecified: Secondary | ICD-10-CM | POA: Diagnosis not present

## 2023-04-15 DIAGNOSIS — E785 Hyperlipidemia, unspecified: Secondary | ICD-10-CM

## 2023-04-15 DIAGNOSIS — Z7901 Long term (current) use of anticoagulants: Secondary | ICD-10-CM

## 2023-04-15 DIAGNOSIS — I428 Other cardiomyopathies: Secondary | ICD-10-CM | POA: Diagnosis not present

## 2023-04-15 DIAGNOSIS — I10 Essential (primary) hypertension: Secondary | ICD-10-CM

## 2023-04-15 DIAGNOSIS — N1832 Chronic kidney disease, stage 3b: Secondary | ICD-10-CM | POA: Diagnosis not present

## 2023-04-15 DIAGNOSIS — I2510 Atherosclerotic heart disease of native coronary artery without angina pectoris: Secondary | ICD-10-CM

## 2023-04-15 NOTE — Patient Instructions (Signed)
Medication Instructions:   Your physician recommends that you continue on your current medications as directed. Please refer to the Current Medication list given to you today.   *If you need a refill on your cardiac medications before your next appointment, please call your pharmacy*   Lab Work:  TODAY!!!!! LIPID/CMET/APO B/LPA  If you have labs (blood work) drawn today and your tests are completely normal, you will receive your results only by: MyChart Message (if you have MyChart) OR A paper copy in the mail If you have any lab test that is abnormal or we need to change your treatment, we will call you to review the results.   Testing/Procedures:  Your physician has requested that you have an echocardiogram. Echocardiography is a painless test that uses sound waves to create images of your heart. It provides your doctor with information about the size and shape of your heart and how well your heart's chambers and valves are working. This procedure takes approximately one hour. There are no restrictions for this procedure. Please do NOT wear cologne, aftershave, or lotions (deodorant is allowed). Please arrive 15 minutes prior to your appointment time.   Follow-Up: At [facility], you and your health needs are our priority.  As part of our continuing mission to provide you with exceptional heart care, we have created designated Provider Care Teams.  These Care Teams include your primary Cardiologist (physician) and Advanced Practice Providers (APPs -  Physician Assistants and Nurse Practitioners) who all work together to provide you with the care you need, when you need it.  We recommend signing up for the patient portal called "MyChart".  Sign up information is provided on this After Visit Summary.  MyChart is used to connect with patients for Virtual Visits (Telemedicine).  Patients are able to view lab/test results, encounter notes, upcoming appointments, etc.  Non-urgent  messages can be sent to your provider as well.   To learn more about what you can do with MyChart, go to [email or web address].    Your next appointment:   3 month(s)  Provider:   Eligha Bridegroom, NP

## 2023-04-19 ENCOUNTER — Ambulatory Visit: Payer: Medicare Other | Attending: Nurse Practitioner

## 2023-04-20 ENCOUNTER — Telehealth: Payer: Self-pay | Admitting: Nurse Practitioner

## 2023-04-20 ENCOUNTER — Other Ambulatory Visit (HOSPITAL_COMMUNITY): Payer: Self-pay

## 2023-04-20 LAB — COMPREHENSIVE METABOLIC PANEL
ALT: 8 IU/L (ref 0–44)
AST: 18 IU/L (ref 0–40)
Albumin: 4.1 g/dL (ref 3.8–4.8)
Alkaline Phosphatase: 83 IU/L (ref 44–121)
BUN/Creatinine Ratio: 9 — ABNORMAL LOW (ref 10–24)
BUN: 19 mg/dL (ref 8–27)
Bilirubin Total: 0.5 mg/dL (ref 0.0–1.2)
CO2: 26 mmol/L (ref 20–29)
Calcium: 9.3 mg/dL (ref 8.6–10.2)
Chloride: 100 mmol/L (ref 96–106)
Creatinine, Ser: 2.01 mg/dL — ABNORMAL HIGH (ref 0.76–1.27)
Globulin, Total: 3.1 g/dL (ref 1.5–4.5)
Glucose: 94 mg/dL (ref 70–99)
Potassium: 4.7 mmol/L (ref 3.5–5.2)
Sodium: 137 mmol/L (ref 134–144)
Total Protein: 7.2 g/dL (ref 6.0–8.5)
eGFR: 34 mL/min/{1.73_m2} — ABNORMAL LOW (ref >59)

## 2023-04-20 LAB — LIPID PANEL
Chol/HDL Ratio: 2.8 ratio (ref 0.0–5.0)
Cholesterol, Total: 157 mg/dL (ref 100–199)
HDL: 56 mg/dL (ref >39)
LDL Chol Calc (NIH): 87 mg/dL (ref 0–99)
Triglycerides: 73 mg/dL (ref 0–149)
VLDL Cholesterol Cal: 14 mg/dL (ref 5–40)

## 2023-04-20 LAB — LIPOPROTEIN A (LPA): Lipoprotein (a): 195.1 nmol/L — ABNORMAL HIGH (ref <75.0)

## 2023-04-20 LAB — APOLIPOPROTEIN B: Apolipoprotein B: 72 mg/dL (ref <90)

## 2023-04-20 LAB — SPECIMEN STATUS REPORT

## 2023-04-20 NOTE — Telephone Encounter (Signed)
PA request for Repatha has been Submitted. New Encounter created for follow up. For additional info see Pharmacy Prior Auth telephone encounter from 04/20/23.

## 2023-04-20 NOTE — Telephone Encounter (Signed)
Hi,  Could we please check to see if PCSK9i therapy is an option for him? He has hx CAD and elevated lipo a.  Thank you, Marcelino Duster

## 2023-04-21 ENCOUNTER — Telehealth: Payer: Self-pay

## 2023-04-21 ENCOUNTER — Other Ambulatory Visit (HOSPITAL_COMMUNITY): Payer: Self-pay

## 2023-04-21 NOTE — Telephone Encounter (Signed)
Pharmacy Patient Advocate Encounter  Received notification from [facility] that Prior Authorization for REPATHA has been APPROVED from 04/21/23 to 10/22/23. Ran test claim, Copay is $141.27 (PATIENT IN COVERAGE GAP (DONUT HOLE)). This test claim was processed through [facility]- copay amounts may vary at other pharmacies due to pharmacy/plan contracts, or as the patient moves through the different stages of their insurance plan.

## 2023-04-21 NOTE — Telephone Encounter (Signed)
Pharmacy Patient Advocate Encounter   Received notification from Physician's Office that prior authorization for REPATHA is required/requested.   Insurance verification completed.   The patient is insured through [facility] .   Per test claim: PA required; PA submitted to [facility] via CoverMyMeds Key/confirmation #/EOC [id number] Status is pending

## 2023-04-26 MED ORDER — ATORVASTATIN CALCIUM 10 MG PO TABS: 20.0000 mg | ORAL_TABLET | ORAL | Status: DC

## 2023-04-26 NOTE — Addendum Note (Signed)
Addended by: Levi Aland on: 04/26/2023 03:08 PM   Modules accepted: Orders

## 2023-04-26 NOTE — Telephone Encounter (Signed)
Discussed cost of Repatha with patient. He reports he already has 2 additional prescriptions that are a financial burden. He will increase atorvastatin to 20 mg once weekly and we will reconsider PCSK9i at a future visit if financial burden of current Rx improves.

## 2023-04-28 ENCOUNTER — Other Ambulatory Visit: Payer: Self-pay | Admitting: Cardiology

## 2023-04-28 DIAGNOSIS — I480 Paroxysmal atrial fibrillation: Secondary | ICD-10-CM

## 2023-04-29 NOTE — Telephone Encounter (Signed)
Prescription refill request for Xarelto received.  Indication:afib Last office visit:8/24 Weight:115.7  kg Age:77 Scr:2.01  8/24 CrCl:51.97  ml/min  Prescription refilled

## 2023-05-03 ENCOUNTER — Other Ambulatory Visit (HOSPITAL_COMMUNITY): Payer: Medicare Other

## 2023-05-04 ENCOUNTER — Telehealth: Payer: Self-pay | Admitting: Cardiology

## 2023-05-04 DIAGNOSIS — I480 Paroxysmal atrial fibrillation: Secondary | ICD-10-CM

## 2023-05-04 NOTE — Telephone Encounter (Signed)
I spoke with patient.  He reports refill for Xarelto was recently sent to a new pharmacy as [facility] has closed.  He is now in the donut hole and will not be able to afford refills.  He currently has medication for another week or so.  I gave patient phone number for Xarelto assistance program.  I let patient know we can give him samples while waiting to hear if he has been approved for assistance program.  I asked him to call us back with an update after he contacted assistance program.

## 2023-05-04 NOTE — Telephone Encounter (Signed)
Pt c/o medication issue:  1. Name of Medication:   XARELTO 15 MG TABS tablet   2. How are you currently taking this medication (dosage and times per day)?   As prescribed  3. Are you having a reaction (difficulty breathing--STAT)?  No  4. What is your medication issue?   Patient stated he is in the donut hole and wants to get samples of this medication or assistance getting this medication.

## 2023-05-05 MED ORDER — RIVAROXABAN 15 MG PO TABS
15.0000 mg | ORAL_TABLET | Freq: Every day | ORAL | Status: DC
Start: 2023-05-05 — End: 2024-04-27

## 2023-05-05 NOTE — Telephone Encounter (Signed)
**Note De-Identified [name] [name] Obfuscation** I called the pt and we discussed him getting his Xarelto from the Xarelto wthMe discount program and he states that he is interested in enrolling in the program and that he feels he can afford $89/30 day supply.  Per his request, I called his phone number back and left a VM message with the phone number so he can call Xarelto withme to enroll in their Xarelto program.  He is requesting Xarelto samples as he is almost out and will run out while waiting for his to arrive from the Xarelto withMe program. I advised him that we will leave him 2 weeks of Xarelto 15 mg samples in the font office for him to pick up.

## 2023-05-05 NOTE — Telephone Encounter (Signed)
Leaving pt 2 bottles of Xarelto 15 mg tablets at [facility] front desk for pt to pick up. FYI

## 2023-05-07 ENCOUNTER — Emergency Department (HOSPITAL_BASED_OUTPATIENT_CLINIC_OR_DEPARTMENT_OTHER)
Admission: EM | Admit: 2023-05-07 | Discharge: 2023-05-07 | Disposition: A | Discharge status: Home / Self Care | Payer: Medicare Other | Attending: Emergency Medicine | Admitting: Emergency Medicine

## 2023-05-07 ENCOUNTER — Encounter (HOSPITAL_BASED_OUTPATIENT_CLINIC_OR_DEPARTMENT_OTHER): Payer: Self-pay

## 2023-05-07 ENCOUNTER — Emergency Department (HOSPITAL_BASED_OUTPATIENT_CLINIC_OR_DEPARTMENT_OTHER): Payer: Medicare Other

## 2023-05-07 DIAGNOSIS — Z7901 Long term (current) use of anticoagulants: Secondary | ICD-10-CM | POA: Diagnosis not present

## 2023-05-07 DIAGNOSIS — K921 Melena: Secondary | ICD-10-CM | POA: Insufficient documentation

## 2023-05-07 DIAGNOSIS — R1032 Left lower quadrant pain: Secondary | ICD-10-CM | POA: Diagnosis present

## 2023-05-07 LAB — COMPREHENSIVE METABOLIC PANEL
ALT: 7 U/L (ref 0–44)
AST: 16 U/L (ref 15–41)
Albumin: 3.9 g/dL (ref 3.5–5.0)
Alkaline Phosphatase: 69 U/L (ref 38–126)
Anion gap: 8 (ref 5–15)
BUN: 23 mg/dL (ref 8–23)
CO2: 29 mmol/L (ref 22–32)
Calcium: 8.8 mg/dL — ABNORMAL LOW (ref 8.9–10.3)
Chloride: 102 mmol/L (ref 98–111)
Creatinine, Ser: 2.05 mg/dL — ABNORMAL HIGH (ref 0.61–1.24)
GFR, Estimated: 33 mL/min — ABNORMAL LOW (ref >60)
Glucose, Bld: 94 mg/dL (ref 70–99)
Potassium: 4 mmol/L (ref 3.5–5.1)
Sodium: 139 mmol/L (ref 135–145)
Total Bilirubin: 0.5 mg/dL (ref 0.3–1.2)
Total Protein: 7.5 g/dL (ref 6.5–8.1)

## 2023-05-07 LAB — CBC
HCT: 37.2 % — ABNORMAL LOW (ref 39.0–52.0)
Hemoglobin: 12.3 g/dL — ABNORMAL LOW (ref 13.0–17.0)
MCH: 29.4 pg (ref 26.0–34.0)
MCHC: 33.1 g/dL (ref 30.0–36.0)
MCV: 89 fL (ref 80.0–100.0)
Platelets: 255 10*3/uL (ref 150–400)
RBC: 4.18 MIL/uL — ABNORMAL LOW (ref 4.22–5.81)
RDW: 13.9 % (ref 11.5–15.5)
WBC: 5.9 10*3/uL (ref 4.0–10.5)
nRBC: 0 % (ref 0.0–0.2)

## 2023-05-07 LAB — URINALYSIS, ROUTINE W REFLEX MICROSCOPIC
Bilirubin Urine: NEGATIVE
Glucose, UA: NEGATIVE mg/dL
Hgb urine dipstick: NEGATIVE
Ketones, ur: NEGATIVE mg/dL
Leukocytes,Ua: NEGATIVE
Nitrite: NEGATIVE
Protein, ur: NEGATIVE mg/dL
Specific Gravity, Urine: 1.018 (ref 1.005–1.030)
pH: 6 (ref 5.0–8.0)

## 2023-05-07 LAB — OCCULT BLOOD X 1 CARD TO LAB, STOOL: Fecal Occult Bld: POSITIVE — AB

## 2023-05-07 LAB — LIPASE, BLOOD: Lipase: 27 U/L (ref 11–51)

## 2023-05-07 MED ORDER — HYDROCORTISONE ACETATE 25 MG RE SUPP: 25.0000 mg | Freq: Two times a day (BID) | RECTAL | 0 refills | Status: DC

## 2023-05-07 MED ORDER — WITCH HAZEL-GLYCERIN EX PADS: 1.0000 | MEDICATED_PAD | CUTANEOUS | 12 refills | Status: AC | PRN

## 2023-05-07 NOTE — ED Notes (Signed)
Provider at the bedside.  

## 2023-05-07 NOTE — Discharge Instructions (Signed)
It was a pleasure taking care of you here in the emergency department  We recommended admission for blood in your stool which she declined.  I have written a few medications to help with hemorrhoids  Make sure to follow-up with your gastroenterologist  Please return to [facility] or [facility] if you change your mind about admission or if your symptoms worsen

## 2023-05-07 NOTE — ED Provider Notes (Signed)
[place] EMERGENCY DEPARTMENT AT [facility] Provider Note   CSN: [id number] Arrival date & time: 05/07/23  1327    History  Chief Complaint  Patient presents with   Abdominal Pain    [name] [name] is a 77 y.o. male history of cardiomyopathy, A-fib on chronic anticoagulation, CVA here for evaluation of abdominal pain.  Over the last 5 days or so he has had left lower quadrant abdominal pain.  States his stools have been "harder" than normal.  He noted he has been straining with bowel movements and subsequently has developed some bright red blood in the toilet when he has a bowel movement.  He is going 1-2 times daily.  He was seen by Deboraha Sprang, gastroenterology on 05/05/23 and prescribed Linzess for his constipation.  He is taken 1 dose.  State he had a bowel movement this morning and there was blood so he subsequently came here.  He does note that he has history of hemorrhoids and to state he has some mild rectal pain, worsening with bowel movements.  He denies any prior history of diverticulitis however last colonoscopy in 22 did show polyps.  No fever, nausea, vomiting, lightheadedness, chest pain, shortness of breath, back pain, dysuria or hematuria.  He is compliant with his anticoagulation.  States he also has blood on the toilet paper when he wipes.  He shows me a picture of the toilet bowl full of blood.  Does not appear to have any clots.  HPI     Home Medications Prior to Admission medications   Medication Sig Start Date End Date Taking? Authorizing Provider  hydrocortisone (ANUSOL-HC) 25 MG suppository Place 1 suppository (25 mg total) rectally 2 (two) times daily. 05/07/23  Yes Caran Storck A, PA-C  witch hazel-glycerin (TUCKS) pad Apply 1 Application topically as needed for itching. 05/07/23  Yes Nicki Furlan A, PA-C  acetaminophen (TYLENOL) 325 MG tablet Take 325 mg by mouth every 6 (six) hours as needed for mild pain or fever.    [provider]   allopurinol (ZYLOPRIM) 300 MG tablet Take 300 mg by mouth daily.    [provider]  amLODipine (NORVASC) 10 MG tablet Take 10 mg by mouth daily. 09/13/14   [provider]  atorvastatin (LIPITOR) 10 MG tablet Take 2 tablets (20 mg total) by mouth once a week. 04/26/23   Swinyer, Zachary George, NP  cetirizine (ZYRTEC ALLERGY) 10 MG tablet Take 1 tablet (10 mg total) by mouth daily. Patient taking differently: Take 10 mg by mouth daily as needed for rhinitis. 08/02/20   Wallis Bamberg, PA-C  colchicine 0.6 MG tablet Take 0.6 mg by mouth daily as needed (for gout).     [provider]  dextromethorphan-guaiFENesin (MUCINEX DM) 30-600 MG 12hr tablet Take 1 tablet by mouth 2 (two) times daily. Patient not taking: Reported on 04/15/2023 10/22/22   Ellsworth Lennox, PA-C  ezetimibe (ZETIA) 10 MG tablet Take 10 mg by mouth daily. 12/07/21   [provider]  fluticasone (FLONASE) 50 MCG/ACT nasal spray Place 1 spray into both nostrils at bedtime.    [provider]  gabapentin (NEURONTIN) 300 MG capsule Take 300 mg by mouth 3 (three) times daily.    [provider]  hydrochlorothiazide (HYDRODIURIL) 25 MG tablet Take 25 mg by mouth daily. 12/30/17   [provider]  hydrocortisone 2.5 % cream Apply 1 application. topically 2 (two) times daily as needed (irritation).    [provider]  ipratropium (ATROVENT) 0.03 %  nasal spray Place 2 sprays into both nostrils at bedtime as needed for rhinitis. 02/15/23   [provider]  irbesartan (AVAPRO) 300 MG tablet Take 300 mg by mouth every morning. 09/01/18   [provider]  linaclotide (LINZESS) 72 MCG capsule Take 72 mcg by mouth daily as needed (Constipation).    [provider]  metoprolol succinate (TOPROL-XL) 50 MG 24 hr tablet Take 1 tablet (50 mg total) by mouth daily. Take with or immediately following a meal. 11/03/21   Jake Bathe, MD  Multiple Vitamin (MULTIVITAMIN WITH  MINERALS) TABS tablet Take 1 tablet by mouth daily.    [provider]  omeprazole (PRILOSEC) 40 MG capsule Take 40 mg by mouth daily.    [provider]  polyethylene glycol (MIRALAX / GLYCOLAX) 17 g packet Take 17 g by mouth daily as needed for moderate constipation.    [provider]  promethazine (PHENERGAN) 25 MG tablet Take 25 mg by mouth every 6 (six) hours as needed for nausea or vomiting.    [provider]  Rivaroxaban (XARELTO) 15 MG TABS tablet Take 1 tablet (15 mg total) by mouth daily with supper. 05/05/23   Jake Bathe, MD  tamsulosin (FLOMAX) 0.4 MG CAPS capsule Take 0.4 mg by mouth daily.    [provider]  traMADol (ULTRAM) 50 MG tablet Take 50 mg by mouth every 6 (six) hours as needed for severe pain.    [provider]      Allergies    Benazepril, Pravastatin, Prednisone, and Valsartan    Review of Systems   Review of Systems  Constitutional: Negative.   HENT: Negative.    Respiratory: Negative.    Cardiovascular: Negative.   Gastrointestinal:  Positive for abdominal pain, blood in stool and rectal pain. Negative for abdominal distention, anal bleeding, constipation, diarrhea, nausea and vomiting.  Genitourinary: Negative.   Musculoskeletal: Negative.   Skin: Negative.   Neurological: Negative.   All other systems reviewed and are negative.   Physical Exam Updated Vital Signs BP (!) 144/75   Pulse (!) 53   Temp 98.5 F (36.9 C) (Oral)   Resp 18   SpO2 100%  Physical Exam Vitals and nursing note reviewed. Exam conducted with a chaperone present.  Constitutional:      General: He is not in acute distress.    Appearance: He is well-developed. He is not ill-appearing, toxic-appearing or diaphoretic.  HENT:     Head: Normocephalic and atraumatic.  Eyes:     Pupils: Pupils are equal, round, and reactive to light.  Cardiovascular:     Rate and Rhythm: Normal rate and regular rhythm.     Pulses: Normal  pulses.          Radial pulses are 2+ on the right side and 2+ on the left side.       Dorsalis pedis pulses are 2+ on the right side and 2+ on the left side.     Heart sounds: Normal heart sounds.  Pulmonary:     Effort: Pulmonary effort is normal. No respiratory distress.     Breath sounds: Normal breath sounds.     Comments: Clear bilaterally, speaks in full sentences without difficulty Abdominal:     General: Bowel sounds are normal. There is no distension.     Palpations: Abdomen is soft.     Tenderness: There is abdominal tenderness in the left lower quadrant. There is no right CVA tenderness, left CVA tenderness, guarding  or rebound. Negative signs include Murphy's sign and McBurney's sign.     Comments: Soft, mild tenderness left lower quadrant, no rebound or guarding  Genitourinary:    Rectum: External hemorrhoid and internal hemorrhoid present.     Comments: Gloriajean Dell as chaperone.  External and internal hemorrhoids, light brown stool in rectal vault.  No perirectal, perineal fluctuance, induration.  No obvious fissure Musculoskeletal:        General: Normal range of motion.     Cervical back: Normal range of motion and neck supple.  Skin:    General: Skin is warm and dry.     Capillary Refill: Capillary refill takes less than 2 seconds.  Neurological:     General: No focal deficit present.     Mental Status: He is alert and oriented to person, place, and time.     Comments: Moves all 4 extremities Ambulatory    ED Results / Procedures / Treatments   Labs (all labs ordered are listed, but only abnormal results are displayed) Labs Reviewed  COMPREHENSIVE METABOLIC PANEL - Abnormal; Notable for the following components:      Result Value   Creatinine, Ser 2.05 (*)    Calcium 8.8 (*)    GFR, Estimated 33 (*)    All other components within normal limits  CBC - Abnormal; Notable for the following components:   RBC 4.18 (*)    Hemoglobin 12.3 (*)    HCT 37.2 (*)    All  other components within normal limits  OCCULT BLOOD X 1 CARD TO LAB, STOOL - Abnormal; Notable for the following components:   Fecal Occult Bld POSITIVE (*)    All other components within normal limits  LIPASE, BLOOD  URINALYSIS, ROUTINE W REFLEX MICROSCOPIC    EKG None  Radiology CT ABDOMEN PELVIS WO CONTRAST  Result Date: 05/07/2023 CLINICAL DATA:  Left lower quadrant pain.  Blood in stool. EXAM: CT ABDOMEN AND PELVIS WITHOUT CONTRAST TECHNIQUE: Multidetector CT imaging of the abdomen and pelvis was performed following the standard protocol without IV contrast. RADIATION DOSE REDUCTION: This exam was performed according to the departmental dose-optimization program which includes automated exposure control, adjustment of the mA and/or kV according to patient size and/or use of iterative reconstruction technique. COMPARISON:  01/02/2022 FINDINGS: Lower chest: No acute findings. Hepatobiliary: No mass visualized on this unenhanced exam. Gallbladder is unremarkable. No evidence of biliary ductal dilatation. Pancreas: No mass or inflammatory process visualized on this unenhanced exam. Spleen:  Within normal limits in size. Adrenals/Urinary tract: No evidence of urolithiasis or hydronephrosis. Fluid attenuation renal cysts again seen bilaterally. Unremarkable unopacified urinary bladder. Stomach/Bowel: No evidence of obstruction, inflammatory process, or abnormal fluid collections. Normal appendix visualized. Extensive colonic diverticulosis again noted, without signs of diverticulitis. Vascular/Lymphatic: No pathologically enlarged lymph nodes identified. No evidence of abdominal aortic aneurysm. Reproductive:  Stable mildly enlarged prostate. Other:  None. Musculoskeletal: No suspicious bone lesions identified. Advanced bilateral hip osteoarthritis again noted. IMPRESSION: No acute findings. Colonic diverticulosis, without radiographic evidence of diverticulitis. Stable mildly enlarged prostate.  Electronically Signed   By: Danae Orleans M.D.   On: 05/07/2023 16:15    Procedures Procedures    Medications Ordered in ED Medications - No data to display  ED Course/ Medical Decision Making/ A&P   77 year old with multiple medical comorbidities, chronic anticoagulation here for evaluation of left lower quadrant abdominal pain and blood in his stool.  Was seen by GI for this 2 days ago.  Started on Linzess  as thought likely due to constipation.  He admits to stools "harder" than normal as well as bright red blood in the toilet when he has a bowel movement and when he wipes.  He does admit to pressure of hemorrhoids.  No history of diverticulitis.  Has had some crampy left lower quadrant abdominal pain.  No fever, nausea, vomiting, back pain, lightheadedness or dizziness.  Denies any recent falls or injuries.  No urinary symptoms.  Colonoscopy in 22, polyps, no history of diverticulitis he is afebrile, nonseptic, not ill-appearing.  His heart and lungs are clear.  He has a mild tenderness to his left lower quadrant.  Will plan on labs, imaging, rectal exam and reassess  Labs and imaging personally viewed and interpreted:  CBC without leukocytosis, hemoglobin 12.3 baseline 13 Metabolic panel creatinine 2.05, similar to prior Lipase 27 UA neg for infection Occult positive CT AP without acute findings, does have severe diverticulosis without active diverticulitis  Patient reassessed.  Does not want a thing for pain or nausea. GU exam only brown stool in rectal vault, internal/external hemorrhoids present.  No perirectal, perineal fluctuance, induration or erythema.  Patient currently pending CT scan  Patient reassessed.  I recommended admission given his recurrent rectal bleeding and his anticoagulation use.  Unable to obtain CTA gi bleed study given his chronic kidney disease and creatinine greater than 2.  Patient declines admission.  We discussed risk versus benefit.  Patient continues to  decline admission.  He does have some hemorrhoids will write for some Anusol cream.  I encouraged him to follow-up with his gastroenterologist, return to the emergency department if his bleeding worsens or he changes his mind about admission.  Patient will be leaving AGAINST MEDICAL ADVICE  We discussed the nature and purpose, risks and benefits, as well as, the alternatives of treatment. Time was given to allow the opportunity to ask questions and consider their options, and after the discussion, the patient decided to refuse the offerred treatment. The patient was informed that refusal could lead to, but was not limited to, death, permanent disability, or severe pain. If present, I asked the relatives or significant others to dissuade them without success. Prior to refusing, I determined that the patient had the capacity to make their decision and understood the consequences of that decision. After refusal, I made every reasonable opportunity to treat them to the best of my ability.  The patient was notified that they may return to the emergency department at any time for further treatment.                                   Medical Decision Making Amount and/or Complexity of Data Reviewed External Data Reviewed: labs, radiology and notes. Labs: ordered. Decision-making details documented in ED Course. Radiology: ordered and independent interpretation performed. Decision-making details documented in ED Course.  Risk OTC drugs. Prescription drug management. Diagnosis or treatment significantly limited by social determinants of health.          Final Clinical Impression(s) / ED Diagnoses Final diagnoses:  Blood in stool  Chronic anticoagulation  Left lower quadrant abdominal pain    Rx / DC Orders ED Discharge Orders          Ordered    hydrocortisone (ANUSOL-HC) 25 MG suppository  2 times daily        05/07/23 1723    witch hazel-glycerin (TUCKS) pad  As needed  05/07/23 1723              Keiran Sias A, PA-C 05/07/23 1753    Terald Sleeper, MD 05/08/23 [phone]

## 2023-05-07 NOTE — ED Notes (Signed)
Pt expressed understanding that he is leaving AMA, and that he is welcome to return at anytime. Pt expressed understanding that shortness of breath, dizziness, weakness, could all be symptoms of low blood levels and to return to the ER immediatly or call 911 if he has any of these symptoms or concerns. Pt ambulated out with even steady gait, no apparent distress.

## 2023-05-07 NOTE — ED Triage Notes (Signed)
He reports mild llq area abd. Pain, plus he saw "a little blood in my stool". He is ambulatory and in no distress.

## 2023-05-07 NOTE — ED Notes (Signed)
Pt to Rad via w/c with tech.

## 2023-05-12 ENCOUNTER — Ambulatory Visit (HOSPITAL_COMMUNITY): Payer: Medicare Other | Attending: Nurse Practitioner

## 2023-05-12 DIAGNOSIS — I447 Left bundle-branch block, unspecified: Secondary | ICD-10-CM

## 2023-05-12 DIAGNOSIS — I428 Other cardiomyopathies: Secondary | ICD-10-CM | POA: Diagnosis not present

## 2023-05-12 DIAGNOSIS — N1832 Chronic kidney disease, stage 3b: Secondary | ICD-10-CM | POA: Diagnosis not present

## 2023-05-12 DIAGNOSIS — I480 Paroxysmal atrial fibrillation: Secondary | ICD-10-CM | POA: Diagnosis present

## 2023-05-12 DIAGNOSIS — I10 Essential (primary) hypertension: Secondary | ICD-10-CM

## 2023-05-12 LAB — ECHOCARDIOGRAM COMPLETE
Area-P 1/2: 4.19 cm2
Est EF: 55
S' Lateral: 2.9 cm

## 2023-05-25 ENCOUNTER — Telehealth (HOSPITAL_BASED_OUTPATIENT_CLINIC_OR_DEPARTMENT_OTHER): Payer: Self-pay | Admitting: *Deleted

## 2023-05-25 NOTE — Telephone Encounter (Signed)
Pre-operative Risk Assessment    Patient Name: AMEDEO SORIANO  DOB: Oct 18, 1945 MRN: [id number]      Request for Surgical Clearance    Procedure:   Colonoscopy/endoscopy  Date of Surgery:  Clearance 06/16/23                                 Surgeon:  Dr. Kathi Der Surgeon's Group or Practice Name:  [facility] Phone number:  [phone] Fax number:  [phone]    Type of Clearance Requested:   - Medical  - Pharmacy:  Hold Rivaroxaban (Xarelto) Not Indicated   Type of Anesthesia:   Propofol   Additional requests/questions:    Signed, Emmit Pomfret   05/25/2023, 3:10 PM

## 2023-05-26 NOTE — Telephone Encounter (Signed)
Patient with diagnosis of a fib on Xarelto for anticoagulation.    Procedure: Colonoscopy/endoscopy  Date of procedure: 06/16/23   CHA2DS2-VASc Score = 7  This indicates a 11.2% annual risk of stroke. The patient's score is based upon: CHF History: 1 HTN History: 1 Diabetes History: 0 Stroke History: 2 Vascular Disease History: 1 Age Score: 2 Gender Score: 0  CrCl 51 mL/min Platelet count 255K  Due to high risk score and history of TIA, recommend 1 day Xarelto hold before procedure  **This guidance is not considered finalized until pre-operative APP has relayed final recommendations.**

## 2023-05-26 NOTE — Telephone Encounter (Signed)
Name: [name] [name]  DOB: Oct 07, 1945  MRN: [id number]   Primary Cardiologist: Donato Schultz, MD  Chart reviewed as part of pre-operative protocol coverage. Patient was contacted 05/26/2023 in reference to pre-operative risk assessment for pending surgery as outlined below.  [name] [name] was last seen on 04/15/2023 by Eligha Bridegroom NP.  Since that day, [name] [name] has done well without exertional chest pain or worsening dyspnea.  Therefore, based on ACC/AHA guidelines, the patient would be at acceptable risk for the planned procedure without further cardiovascular testing.   Based on clinical recommendation by our clinical pharmacist, patient has been instructed to hold Xarelto for 1 day prior to the procedure and restart as soon as possible afterward at the surgeon's discretion.  The patient was advised that if he develops new symptoms prior to surgery to contact our office to arrange for a follow-up visit, and he verbalized understanding.  I will route this recommendation to the requesting party via Epic fax function and remove from pre-op pool. Please call with questions.  [place], [place] 05/26/2023, 11:22 AM

## 2023-07-11 NOTE — Progress Notes (Unsigned)
Cardiology Office Note:  .   Date:  07/12/2023  ID:  [name] [name], DOB [birth year], MRN [id number] PCP: Tally Joe, MD  [facility] Providers Cardiologist:  Donato Schultz, MD    Patient Profile: .      PMH: CAD Mild to moderate nonobstructive CAD 50% distal LCx stenosis on LHC 02/10/2018  NICM Hypertension TIA PAF On Xarelto CHA2DS2-VASc score of 7 Left bundle branch block CKD stage IIIb Hyperlipidemia Elevated lipoprotein a  Seen in A-fib clinic 12/2017 for new onset brief atrial fibrillation.  Noted LBBB.  Echo with LVEF 45 to 50% and grade 1 DD.  Follow-up nuclear stress test showed EF 41%, peri-infarct ischemia.  He underwent catheterization 12/2017 for further evaluation which showed mild nonobstructive CAD including 50% distal LCx stenosis.  His chronic kidney disease is followed by [facility].  He underwent cardiac risk evaluation for sinus surgery on 02/10/23 at which time he was asymptomatic from a cardiac standpoint.   Seen by me on 04/15/23 for follow-up. Reported feeling well following sinus surgery, is glad he did it. He denied chest pain, shortness of breath, lower extremity edema, fatigue, palpitations, melena, presyncope, syncope, orthopnea, and PND. Due to see nephrologist in January, Dr. Joanne Chars. Admitted to weight gain from eating peanuts and chocolate. No specific cardiac complaints. He does not like taking multiple medications.  We had a lengthy discussion about healthy diet, weight loss, and exercise. We considered PCSK9i therapy due to elevated lipoprotein a and LDL > 70. Repatha was cost-prohibitive so we elected to increase atorvastatin to 20 mg daily and continue ezetimibe. Echo 05/12/23 revealed normal LVEF 55%, no rwma, abnormal septal motion due to LBBB, mild LVH, G1DD, normal RV, trivial AI, trivial MR.        History of Present Illness: .   [name] [name] is a very pleasant 77 y.o. male who is here today for follow-up. He reports he is  feeling well. He remains active with helping his sister-in-law and going to church. His daughter lives close by and has been encouraging him to eat a more healthy diet.  He is planning to start going to [place] with his friend to exercise. He denies chest pain, shortness of breath, lower extremity edema, palpitations,  presyncope, syncope, orthopnea, and PND.  We had a lengthy discussion about secondary prevention of ASCVD. His sleep is generally disruptive - he sleeps for about 4-5 hours then is awake for about an hour and reads then falls back asleep. Would like to sleep later in the mornings but then people start calling to check on him. He is motivated to reduce pill burden.   ROS: See HPI       Studies Reviewed: .         Risk Assessment/Calculations:    CHA2DS2-VASc Score = 7   This indicates a 11.2% annual risk of stroke. The patient's score is based upon: CHF History: 1 HTN History: 1 Diabetes History: 0 Stroke History: 2 Vascular Disease History: 1 Age Score: 2 Gender Score: 0            Physical Exam:   VS:  BP 120/60 (BP Location: Left Arm, Patient Position: Sitting, Cuff Size: Normal)   Pulse 65   Ht 5\' 10"  (1.778 m)   Wt 259 lb (117.5 kg)   BMI 37.16 kg/m    Wt Readings from Last 3 Encounters:  07/12/23 259 lb (117.5 kg)  04/15/23 255 lb (115.7 kg)  03/30/23 250 lb (113.4  kg)    GEN: Well nourished, well developed in no acute distress NECK: No JVD; No carotid bruits CARDIAC: RRR, no murmurs, rubs, gallops RESPIRATORY:  Clear to auscultation without rales, wheezing or rhonchi  ABDOMEN: Soft, non-tender, non-distended, protuberant EXTREMITIES:  No edema; No deformity     ASSESSMENT AND PLAN: .    PAF on chronic anticoagulation: HR is well controlled. Clinically appears to be maintaining sinus rhythm. No tachy-palpitations. No bleeding concerns. Xarelto dose was decreased 10/2022 due to CrCl 38 ml/min. His creatinine has fluctuated between 2.2 and 2.0. He has  been having difficulty affording his medication while in the donut hole and was recently given samples of Xarelto 15 mg daily. Will advise him to notify us when he is ready for refill so we can ensure adequate dose. Continue OAC for stroke prevention for CHA2DS2-VASc score of 7.   LBBB: Known. EF 55%, abnormal septal wall motion consistent with LBBB on TTE 05/12/23. History of non-obstructive CAD on [facility] 01/2018.  No further testing indicated at this time.  Hypertension: BP is well controlled. He would like to reduce pill burden. Encouraged heart healthy mostly plant based diet avoiding saturated fat, processed foods, salt, simple carbohydrates, and sugar along with aiming for at least 150 minutes of moderate intensity exercise each week.   CAD without angina: Nonobstructive coronary artery disease including 50% distal LCx stenosis on cath 01/2018. He denies chest pain, dyspnea, or other symptoms concerning for angina.  No indication for further ischemic evaluation at this time. Emphasized the importance of secondary prevention including LDL goal less than 70, good blood pressure control, weight loss, regular exercise, and healthy diet.  Hyperlipidemia LDL goal < 70: Lipid panel completed 04/19/23 revealed total cholesterol 157, HDL 56, LDL 87, and triglycerides 73. 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 He felt that Repatha was cost-prohibitive. He increased his atorvastatin to 20 mg daily and continued ezetimibe.  We will repeat NMR within the next week when he can return fasting.  Will consider resubmitting Repatha prescription after the first of the year. Heart healthy mostly plant based diet avoiding saturated fat, processed foods, simple carbohydrates, and sugar along with aiming for at least 150 minutes of moderate intensity exercise each week encouraged.   CKD: Stable renal function on labs completed 05/07/2023. He is due to see nephrologist in January. No medication changes today.        Dispo: 6 months with Dr.  Anne Fu or me  Signed, Eligha Bridegroom, NP-C

## 2023-07-12 ENCOUNTER — Encounter: Payer: Self-pay | Admitting: Nurse Practitioner

## 2023-07-12 ENCOUNTER — Ambulatory Visit: Payer: Medicare Other | Attending: Nurse Practitioner | Admitting: Nurse Practitioner

## 2023-07-12 ENCOUNTER — Ambulatory Visit: Payer: Medicare Other | Admitting: Nurse Practitioner

## 2023-07-12 VITALS — BP 120/60 | HR 65 | Ht 70.0 in | Wt 259.0 lb

## 2023-07-12 DIAGNOSIS — Z7901 Long term (current) use of anticoagulants: Secondary | ICD-10-CM

## 2023-07-12 DIAGNOSIS — I2510 Atherosclerotic heart disease of native coronary artery without angina pectoris: Secondary | ICD-10-CM

## 2023-07-12 DIAGNOSIS — I447 Left bundle-branch block, unspecified: Secondary | ICD-10-CM

## 2023-07-12 DIAGNOSIS — I480 Paroxysmal atrial fibrillation: Secondary | ICD-10-CM | POA: Diagnosis not present

## 2023-07-12 DIAGNOSIS — N1832 Chronic kidney disease, stage 3b: Secondary | ICD-10-CM

## 2023-07-12 DIAGNOSIS — E785 Hyperlipidemia, unspecified: Secondary | ICD-10-CM | POA: Diagnosis not present

## 2023-07-12 DIAGNOSIS — I428 Other cardiomyopathies: Secondary | ICD-10-CM | POA: Diagnosis not present

## 2023-07-12 NOTE — Patient Instructions (Signed)
Medication Instructions:   Your physician recommends that you continue on your current medications as directed. Please refer to the Current Medication list given to you today.   *If you need a refill on your cardiac medications before your next appointment, please call your pharmacy*   Lab Work:  Come in at your convenience fasting after midnight. You can come anytime between 7:30 am -4:30 pm fasting from midnight the night before. Closed from 12:30-1:30 for lunch.     If you have labs (blood work) drawn today and your tests are completely normal, you will receive your results only by: MyChart Message (if you have MyChart) OR A paper copy in the mail If you have any lab test that is abnormal or we need to change your treatment, we will call you to review the results.   Testing/Procedures:  None ordered.   Follow-Up: At [facility], you and your health needs are our priority.  As part of our continuing mission to provide you with exceptional heart care, we have created designated Provider Care Teams.  These Care Teams include your primary Cardiologist (physician) and Advanced Practice Providers (APPs -  Physician Assistants and Nurse Practitioners) who all work together to provide you with the care you need, when you need it.  We recommend signing up for the patient portal called "MyChart".  Sign up information is provided on this After Visit Summary.  MyChart is used to connect with patients for Virtual Visits (Telemedicine).  Patients are able to view lab/test results, encounter notes, upcoming appointments, etc.  Non-urgent messages can be sent to your provider as well.   To learn more about what you can do with MyChart, go to [email or web address].    Your next appointment:   6 month(s)  Provider:   Donato Schultz, MD  or Lebron Conners   Other Instructions  Your physician wants you to follow-up in: 6 months.  You will receive a reminder letter in the mail two  months in advance. If you don't receive a letter, please call our office to schedule the follow-up appointment.      Tackling Obesity with Lifestyle Changes  Obesity- What is it? And What can we do about it?  Obesity is a chronic complex disease defined as excessive fat deposits that can have a negative effect on our health. It can lead to many other diseases including type 2 diabetes.  Weight gain occurs when the amount of energy (calories) we consume is greater than the amount we use.  When our energy output is greater than our energy input we lose weight. The basic concept is simple, but in reality, it's much more complicated.  Unfortunately, in some people, our bodies have many ways it can compensate when we try to eat less and move more which can prevent [place] from changing our weight. This can lead to some people having a much more difficult time losing weight even when they put healthy habits into practice. This can be frustrating. We want to focus on healthy habits, physical activity and how we feel, and less the number on the scale.  Food As Energy  Calories  Calories is just a unit of measurement for energy.  Counting calories is not required to lose weight but counting for a short period of time can:   help you learn good portion sizes   Learn what your true energy needs are.   Help you be more aware of your snacking or grazing habits  To help  calculate how many calories you should be eating, the NIH has a great body weight planner calculator at BeverageBuggy.si  Types of Energy Expenditure  Basal Metabolic Rate (BMR) Energy that our bodies use to preform everyday tasks. More muscle mass through resistance training can increase this a small amount  Thermic Effect of Food The amount of energy that it takes to breakdown the food we eat. This will be highest when we eat protein and fiber rich foods  Exercise Energy Expenditure The amount of energy used during formal  exercise (walking, biking, weightlifting)  Non-exercise activity thermogenesis (NEAT) The amount of energy spent on activities that are not formal exercise (standing, fidgeting). Therefore, it is not only important to do formal exercise but also move around throughout the day.  Managing The Meal  Macro nutrients (carbohydrates, fats and protein, fiber, water)  Micronutrients (vitamins, minerals)  Dietary Fiber  Benefits Examples Cautions  Soluble fiber  Decreases cholesterol  improve blood sugar control,  Feeds our gut bacteria  Allows [place] to feel fuller for longer so we eat less  fruits  oats  barley  legumes  peas  Beans  vegetables (broccoli) and root vegetables (carrots) Add fiber into your diet slowly and be sure to drink at least 8 cups of water a day. This will help limit gas, bloating, diarrhea, or constipation.  Insoluble Fiber  Improves digestive health by making stool easier to pass  Allows [place] to feel fuller for longer so we eat less  whole grains  nuts  seeds  skin of fruit  vegetables (green beans, zucchini, cauliflower)  Tricks to add more fiber to your diet   Add beans (pinto, kidney, lima, navy and garbanzo) to salads, ground meat or brown rice   Add nuts or seeds and or fresh/frozen fruit to yogurt, cottage cheese, salads or steel cut oats   Cut up vegetables and eat with hummus   Look for unsweetened whole grain cereals with at least 5g of fiber per serving   Switch to whole grain bread. Look for bread that has whole grain flour as the first ingredient and has more fiber than carbs if you were to multiple the fiber x 10.   Try bulgar, barely, quinoa, buckwheat, brown rice wild rice instead of white rice   Keep frozen vegetables on hand to add to dishes or soups  Meal Planning:  Meal planning is the key to setting you up for success. Here are some examples of healthy meal options.  Breakfast  Option 1: Omelette with vegetables (1 egg,  spinach, mushrooms, or other vegetable of your choice), 2 slices whole-grain toast, tip of thumb size butter or soft margarine,  cup low-fat milk or yogurt  Option 2: steel-cut rolled oats (? cup dry), 1 tbsp peanut butter added to cooked oats,  cup low-fat milk.  Option 3: 2 slices whole-grain or rye toast with avocado spread ( small avocado mased with herbs and pepper to taste), 1 poached egg or [place] up (cooked to your liking)  Option 4:  cup plain 0% [place] yogurt topped with  cup berries and  cup walnuts or almonds, 2 slices whole-grain or rye toast, tip of thumb size soft margarine/butter  Lunch:  Option 1: 2 cups red lentil soup, green salad with 1 tbsp homemade vinaigrette (extra virgin olive oil and vinegar of choice plus spices)  Option 2: 3 oz. roasted chicken, 2 slices whole-grain bread, 2 tsp mayonnaise, mustard, lettuce, tomato if desired, 1 fruit (example: medium-sized  apple or small pear)  Option 3: 3 oz. tuna packed in water, 1 whole-wheat pita (6 inch), 2 tsp mayonnaise, lettuce, tomato, or other non-starchy vegetable of your choice, 1 fruit (example: medium-sized apple or small pear)  Option 4: 1 serving of garden veggie buddha bowl with lentils and tahini sauce and 1 cup berries topped with  cup plain 0% Greek yogurt  Dinner:  Option 1: 1 serving roasted cauliflower salad, 3-4 oz. grilled or baked pork loin chop, 1/2 cup mashed potato, or brown rice or quinoa  Option 2: 1 serving fish (baked, grilled or air fried), green salad, 1 tbsp homemade vinaigrette,  cup cooked couscous  Option 3: 1 cup cooked whole grained pasta (example: spaghetti, spirals, macaroni),  cup favorite pasta sauce (preferably homemade), 3-4 oz. grilled or baked chicken, green salad, 1 tbsp homemade vinaigrette  Option 4: 1 serving oven roasted salmon,  cup mashed sweet potato or couscous or brown rice or quinoa, broccoli (steamed or roasted)  Healthy snacks:   Carrots or celery  with 1 tbsp of hummus   1 medium-sized fruit (apple or orange)   1 cup plain 0% [place] yogurt with  cup berries   Half apple, sliced, with 1 tbsp (15 mL) peanut or almond butter  Dining out:  Eating away from home has become a part of many people's lifestyle. Making healthy choices when you are eating out is important too. Portion size is an important part of healthy choices. Most branded fast-food places provide calories, sodium, and fat content for their menu items. www.calorieking.com would be great resource to find nutrition facts for your favorite brands and fast-food restaurants. Company specific website can be Chief Technology Officer for nutrition information for their items. (e.g. www.mcdonalds.com or www.nutritionix.com/[place]/menu/premium)  Here are some tips to help you make wise food choices when you are dining out.  Chose more often Avoid  Beverages  Water, low fat milk  Sugar-free/diet drinks  Unsweet tea or coffee  Milkshakes, fruit drinks, regular pop  Alcohol, specialty drinks (e.g. iced cappuccino)  Fast food  Garden salad  Mini subs, pita sandwiches ect with extra vegetables  plain burgers, grilled chicken  Vegetarian or cheese pizza with whole-grain crust  Burgers/sandwiches with bacon, cheese, and high-fat sauces  [place] fries, fried chicken, fried fish, poutine, hash browns  Pizza with processed meats  Starters  Raw vegetables, salads (garden, spinach, fruit)  clear or vegetable soups  Seafood cocktail  Whole-grain breads and rolls  Salads with high-fat dressings or toppings  Creamy soups  Wings, egg rolls  onion rings, nachos  White or garlic bread  Main courses Grains & Starches (amount equal to  of your plate)  Oatmeal, high-fiber/lower-sugar cereals  Whole-grain breads, rice, pasta, barley, couscous  Sweet potatoes  Sugary, low-fiber cereals  Large bagels, muffins, croissants, white bread  [place] fries, hash browns, fried rice Meat and  alternative (amount equal to  of your plate)  Lean meats, poultry, fish, eggs, low-fat cheese  Tofu, vegetable protein Legumes (e.g. lentils, chickpeas, beans)  High-salt and/or high-fat meats (e.g. ribs, wings, sausages, wieners, processed lunch meats, imposter meats) Vegetables (amount equal to  of your plate)  Salads ([place], garden, spinach), plain vegetables  Salads with creamy, high-fat dressings and   Vegetables on sandwiches ect toppings like bacon bits, croutons, cheese  Desserts  Fresh fruit, frozen yogourt, skim milk latte  Cakes, pies, pastries, ice cream, cheesecake

## 2023-07-12 NOTE — Addendum Note (Signed)
Addended by: Levi Aland on: 07/12/2023 04:38 PM   Modules accepted: Orders

## 2023-08-17 ENCOUNTER — Ambulatory Visit
Admission: EM | Admit: 2023-08-17 | Discharge: 2023-08-17 | Disposition: A | Discharge status: Home / Self Care | Payer: Medicare Other | Attending: Internal Medicine | Admitting: Internal Medicine

## 2023-08-17 ENCOUNTER — Encounter: Payer: Self-pay | Admitting: *Deleted

## 2023-08-17 ENCOUNTER — Other Ambulatory Visit: Payer: Self-pay

## 2023-08-17 DIAGNOSIS — R051 Acute cough: Secondary | ICD-10-CM | POA: Insufficient documentation

## 2023-08-17 MED ORDER — PROMETHAZINE-DM 6.25-15 MG/5ML PO SYRP
5.0000 mL | ORAL_SOLUTION | Freq: Four times a day (QID) | ORAL | 0 refills | Status: AC | PRN
Start: 2023-08-17 — End: 2023-08-22

## 2023-08-17 NOTE — Discharge Instructions (Signed)
 Test results will be released to your MyChart account We will contact you if anything is positive and requires treatment.

## 2023-08-17 NOTE — ED Triage Notes (Signed)
Pt reports cough, productive for "color" since last night. Reports pain at umbilical hernia repair site also

## 2023-08-17 NOTE — ED Provider Notes (Signed)
[facility]    CSN: [id number] Arrival date & time: 08/17/23  1522      History   Chief Complaint Chief Complaint  Patient presents with   Cough    HPI [name] [name] is a 77 y.o. male.    Cough Associated symptoms: no chest pain, no chills, no diaphoresis, no ear pain, no fever, no headaches, no rhinorrhea, no shortness of breath and no wheezing  Sore throat: Scratchy throat last p.m. treated with Cepacol spray. Cough since last p.m., occasionally coughs up a little "colored" sputum.  Denies rhinorrhea, nasal congestion, chest pain, shortness of breath, body aches, fatigue, headache, earache.  Admits soreness along surgical scars when he coughs, states had hernia repair earlier this year.  Nausea, vomiting, diarrhea.  Will send COVID PCR.  Rx cough medicine Signs and follow-up reviewed with patient  Past Medical History:  Diagnosis Date   Arthritis    CKD (chronic kidney disease), stage III (HCC)    Coronary artery disease    GERD (gastroesophageal reflux disease)    Gout    Heart murmur    History of TIA (transient ischemic attack)    Hypertension    Inguinal hernia    Left   Kidney cysts    Left   LBBB (left bundle branch block)    Non-ischemic cardiomyopathy (HCC)    Paroxysmal atrial fibrillation (HCC)    Sleep apnea 2018   CPAP occasionally   Stroke [facility])    TIA    Patient Active Problem List   Diagnosis Date Noted   Left inguinal hernia 11/03/2021   Left bundle branch block 11/03/2021   Chronic kidney disease, stage III (moderate) (HCC) 11/03/2021   Preop cardiovascular exam 11/03/2021   Shortness of breath 02/10/2018   Cardiomyopathy (HCC) 02/10/2018   Abnormal stress test 02/10/2018   Paroxysmal atrial fibrillation (HCC) 02/10/2018    Past Surgical History:  Procedure Laterality Date   COLONOSCOPY     HERNIA REPAIR Right    Inguinal Hernia   NASAL TURBINATE REDUCTION Bilateral 03/30/2023   Procedure: TURBINATE  REDUCTION/SUBMUCOSAL RESECTION;  Surgeon: [name], MD;  [facility] OR;  Service: ENT;  Laterality: Bilateral;   RIGHT/LEFT HEART CATH AND CORONARY ANGIOGRAPHY N/A 02/10/2018   Procedure: RIGHT/LEFT HEART CATH AND CORONARY ANGIOGRAPHY;  Surgeon: Yvonne Kendall, MD;  Location: [facility] INVASIVE CV LAB;  Service: Cardiovascular;  Laterality: N/A;   SINUS ENDO WITH FUSION Bilateral 03/30/2023   Procedure: BILATERAL ANTERIOR ETHMOIDECTOMY, BILATERAL MAXILLARY ANTROSTOMY, FUSION IMAGE GUIDANCE;  Surgeon: [name], MD;  Location: [facility] OR;  Service: ENT;  Laterality: Bilateral;   XI ROBOTIC ASSISTED INGUINAL HERNIA REPAIR WITH MESH Left 01/12/2022   Procedure: XI ROBOTIC ASSISTED LEFT INGUINAL HERNIA REPAIR WITH MESH;  Surgeon: Kinsinger, De Blanch, MD;  Location: [facility] ORS;  Service: General;  Laterality: Left;       Home Medications    Prior to Admission medications   Medication Sig Start Date End Date Taking? Authorizing Provider  allopurinol (ZYLOPRIM) 300 MG tablet Take 300 mg by mouth daily.   Yes [provider]  amLODipine (NORVASC) 10 MG tablet Take 10 mg by mouth daily. 09/13/14  Yes [provider]  atorvastatin (LIPITOR) 10 MG tablet Take 2 tablets (20 mg total) by mouth once a week. 04/26/23  Yes Swinyer, Zachary George, NP  cetirizine (ZYRTEC ALLERGY) 10 MG tablet Take 1 tablet (10 mg total) by mouth daily. 08/02/20  Yes Wallis Bamberg, PA-C  colchicine 0.6 MG tablet  Take 0.6 mg by mouth daily as needed (for gout).    Yes [provider]  ezetimibe (ZETIA) 10 MG tablet Take 10 mg by mouth daily. 12/07/21  Yes [provider]  fluticasone (FLONASE) 50 MCG/ACT nasal spray Place 1 spray into both nostrils at bedtime.   Yes [provider]  gabapentin (NEURONTIN) 300 MG capsule Take 300 mg by mouth 3 (three) times daily.   Yes [provider]  hydrocortisone (ANUSOL-HC) 25 MG suppository Place 1 suppository (25 mg total) rectally 2 (two) times  daily. 05/07/23  Yes Henderly, Britni A, PA-C  hydrocortisone 2.5 % cream Apply 1 application. topically 2 (two) times daily as needed (irritation).   Yes [provider]  ipratropium (ATROVENT) 0.03 % nasal spray Place 2 sprays into both nostrils at bedtime as needed for rhinitis. 02/15/23  Yes [provider]  irbesartan (AVAPRO) 300 MG tablet Take 300 mg by mouth every morning. 09/01/18  Yes [provider]  linaclotide (LINZESS) 72 MCG capsule Take 72 mcg by mouth daily as needed (Constipation).   Yes [provider]  metoprolol succinate (TOPROL-XL) 50 MG 24 hr tablet Take 1 tablet (50 mg total) by mouth daily. Take with or immediately following a meal. 11/03/21  Yes Skains, Veverly Fells, MD  Multiple Vitamin (MULTIVITAMIN WITH MINERALS) TABS tablet Take 1 tablet by mouth daily.   Yes [provider]  omeprazole (PRILOSEC) 40 MG capsule Take 40 mg by mouth daily.   Yes [provider]  polyethylene glycol (MIRALAX / GLYCOLAX) 17 g packet Take 17 g by mouth daily as needed for moderate constipation.   Yes [provider]  promethazine (PHENERGAN) 25 MG tablet Take 25 mg by mouth every 6 (six) hours as needed for nausea or vomiting.   Yes [provider]  Rivaroxaban (XARELTO) 15 MG TABS tablet Take 1 tablet (15 mg total) by mouth daily with supper. 05/05/23  Yes Jake Bathe, MD  tamsulosin (FLOMAX) 0.4 MG CAPS capsule Take 0.4 mg by mouth daily.   Yes [provider]  traMADol (ULTRAM) 50 MG tablet Take 50 mg by mouth every 6 (six) hours as needed for severe pain.   Yes [provider]  acetaminophen (TYLENOL) 325 MG tablet Take 325 mg by mouth every 6 (six) hours as needed for mild pain or fever.    [provider]  dextromethorphan-guaiFENesin (MUCINEX DM) 30-600 MG 12hr tablet Take 1 tablet by mouth 2 (two) times daily. 10/22/22   Ellsworth Lennox, PA-C  hydrochlorothiazide (HYDRODIURIL) 25 MG tablet Take 25 mg by  mouth daily. 12/30/17   [provider]  witch hazel-glycerin (TUCKS) pad Apply 1 Application topically as needed for itching. 05/07/23   Henderly, Britni A, PA-C    Family History Family History  Problem Relation Age of Onset   Cancer Mother    Hypertension Mother    Heart disease Father    Hypertension Father    Hypertension Sister    Hypertension Brother     Social History Social History   Tobacco Use   Smoking status: Never   Smokeless tobacco: Never   Tobacco comments:    Quit smoking 1999  Vaping Use   Vaping status: Never Used  Substance Use Topics   Alcohol use: No   Drug use: No     Allergies   Benazepril, Pravastatin, Prednisone, and Valsartan   Review of Systems Review of Systems  Constitutional:  Negative for chills, diaphoresis, fatigue and fever.  HENT:  Negative for congestion, ear pain and rhinorrhea. Sore throat: Scratchy throat last p.m. treated with Cepacol spray.  Respiratory:  Positive for cough. Negative for shortness of breath and wheezing.   Cardiovascular:  Negative for chest pain.  Gastrointestinal:  Negative for diarrhea, nausea and vomiting.  Neurological:  Negative for headaches.     Physical Exam Triage Vital Signs ED Triage Vitals  Encounter Vitals Group     BP 08/17/23 1539 134/76     Systolic BP Percentile --      Diastolic BP Percentile --      Pulse Rate 08/17/23 1539 70     Resp 08/17/23 1539 18     Temp 08/17/23 1539 98.8 F (37.1 C)     Temp Source 08/17/23 1539 Oral     SpO2 08/17/23 1539 95 %     Weight --      Height --      Head Circumference --      Peak Flow --      Pain Score 08/17/23 1534 5     Pain Loc --      Pain Education --      Exclude from Growth Chart --    No data found.  Updated Vital Signs BP 134/76 (BP Location: Left Arm)   Pulse 70   Temp 98.8 F (37.1 C) (Oral)   Resp 18   SpO2 95%   Visual Acuity Right Eye Distance:   Left Eye Distance:   Bilateral Distance:    Right  Eye Near:   Left Eye Near:    Bilateral Near:     Physical Exam Vitals and nursing note reviewed.  Constitutional:      Appearance: He is not ill-appearing.  HENT:     Head: Normocephalic and atraumatic.     Right Ear: Tympanic membrane and ear canal normal.     Left Ear: Tympanic membrane and ear canal normal.     Nose: No rhinorrhea.     Mouth/Throat:     Mouth: Mucous membranes are moist.     Pharynx: No oropharyngeal exudate or posterior oropharyngeal erythema.  Eyes:     Conjunctiva/sclera: Conjunctivae normal.  Cardiovascular:     Rate and Rhythm: Normal rate and regular rhythm.     Heart sounds: Normal heart sounds.  Pulmonary:     Effort: Pulmonary effort is normal. No respiratory distress.     Breath sounds: Normal breath sounds. No wheezing or rales.  Musculoskeletal:     Right lower leg: No edema.     Left lower leg: No edema.  Skin:    General: Skin is warm and dry.  Neurological:     Mental Status: He is alert and oriented to person, place, and time.      UC Treatments / Results  Labs (all labs ordered are listed, but only abnormal results are displayed) Labs Reviewed - No data to display  EKG   Radiology No results found.  Procedures Procedures (including critical care time)  Medications Ordered in UC Medications - No data to display  Initial Impression / Assessment and Plan / UC Course  I have reviewed the triage vital signs and the nursing notes.  Pertinent labs & imaging results that were available during my care of the patient were reviewed by me and considered in my medical decision making (see chart for details).  Clinical Course as of 08/17/23 1554  Wed Aug 17, 2023  1551 POC Covid19/Flu A&B Antigen [AA]  Clinical Course User Index [AA] Meliton Rattan, PA    77 year old male with cough since last p.m., productive at times, had scratchy throat resolved.  No fever.  Vital signs are stable he is well-appearing, lungs are clear to  auscultation.  Will check COVID and flu. Final Clinical Impressions(s) / UC Diagnoses   Final diagnoses:  None   Discharge Instructions   None    ED Prescriptions   None    PDMP not reviewed this encounter.   Meliton Rattan, [place] 08/17/23 1600

## 2023-08-18 LAB — SARS CORONAVIRUS 2 (TAT 6-24 HRS): SARS Coronavirus 2: NEGATIVE

## 2023-08-29 ENCOUNTER — Encounter: Payer: Self-pay | Admitting: Emergency Medicine

## 2023-08-29 ENCOUNTER — Ambulatory Visit
Admission: EM | Admit: 2023-08-29 | Discharge: 2023-08-29 | Disposition: A | Discharge status: Home / Self Care | Payer: Medicare Other

## 2023-08-29 ENCOUNTER — Ambulatory Visit: Payer: Medicare Other

## 2023-08-29 ENCOUNTER — Other Ambulatory Visit: Payer: Self-pay

## 2023-08-29 DIAGNOSIS — R052 Subacute cough: Secondary | ICD-10-CM

## 2023-08-29 MED ORDER — PROMETHAZINE-DM 6.25-15 MG/5ML PO SYRP: 5.0000 mL | ORAL_SOLUTION | Freq: Four times a day (QID) | ORAL | 0 refills | Status: DC | PRN

## 2023-08-29 NOTE — ED Triage Notes (Signed)
Pt was seen on 12/18 at [facility] for productive cough. Pt reports cough has improved but is still present. Pt has run out of promethazine cough syrup and asks if he can have another prescription as he feels it helped a lot. Cough has decreased and is mostly dry now per pt.

## 2023-09-07 ENCOUNTER — Telehealth: Payer: Self-pay | Admitting: Cardiology

## 2023-09-07 MED ORDER — METOPROLOL SUCCINATE ER 50 MG PO TB24: 50.0000 mg | ORAL_TABLET | Freq: Every day | ORAL | 3 refills | Status: DC

## 2023-09-07 NOTE — Telephone Encounter (Signed)
*  STAT* If patient is at the pharmacy, call can be transferred to refill team.   1. Which medications need to be refilled? (please list name of each medication and dose if known) metoprolol  succinate (TOPROL -XL) 50 MG 24 hr tablet   2. Which pharmacy/location (including street and city if local pharmacy) is medication to be sent to? [facility] [place]  - [place], [place] - [address]   3. Do they need a 30 day or 90 day supply? 90

## 2023-09-07 NOTE — Telephone Encounter (Signed)
 Pt's medication was sent to pt's pharmacy as requested. Confirmation received.

## 2023-10-18 ENCOUNTER — Emergency Department (HOSPITAL_COMMUNITY)
Admission: EM | Admit: 2023-10-18 | Discharge: 2023-10-19 | Disposition: A | Discharge status: Home / Self Care | Payer: HMO | Attending: Student | Admitting: Student

## 2023-10-18 ENCOUNTER — Other Ambulatory Visit: Payer: Self-pay

## 2023-10-18 ENCOUNTER — Encounter (HOSPITAL_COMMUNITY): Payer: Self-pay

## 2023-10-18 ENCOUNTER — Emergency Department (HOSPITAL_COMMUNITY): Payer: HMO

## 2023-10-18 DIAGNOSIS — M5441 Lumbago with sciatica, right side: Secondary | ICD-10-CM | POA: Diagnosis not present

## 2023-10-18 DIAGNOSIS — Z8673 Personal history of transient ischemic attack (TIA), and cerebral infarction without residual deficits: Secondary | ICD-10-CM | POA: Insufficient documentation

## 2023-10-18 DIAGNOSIS — M5431 Sciatica, right side: Secondary | ICD-10-CM

## 2023-10-18 DIAGNOSIS — I2510 Atherosclerotic heart disease of native coronary artery without angina pectoris: Secondary | ICD-10-CM | POA: Diagnosis not present

## 2023-10-18 DIAGNOSIS — N183 Chronic kidney disease, stage 3 unspecified: Secondary | ICD-10-CM | POA: Insufficient documentation

## 2023-10-18 DIAGNOSIS — M545 Low back pain, unspecified: Secondary | ICD-10-CM | POA: Diagnosis present

## 2023-10-18 DIAGNOSIS — Z79899 Other long term (current) drug therapy: Secondary | ICD-10-CM | POA: Diagnosis not present

## 2023-10-18 DIAGNOSIS — I129 Hypertensive chronic kidney disease with stage 1 through stage 4 chronic kidney disease, or unspecified chronic kidney disease: Secondary | ICD-10-CM | POA: Insufficient documentation

## 2023-10-18 LAB — CBC WITH DIFFERENTIAL/PLATELET
Abs Immature Granulocytes: 0.01 10*3/uL (ref 0.00–0.07)
Basophils Absolute: 0 10*3/uL (ref 0.0–0.1)
Basophils Relative: 1 %
Eosinophils Absolute: 0.3 10*3/uL (ref 0.0–0.5)
Eosinophils Relative: 5 %
HCT: 38.1 % — ABNORMAL LOW (ref 39.0–52.0)
Hemoglobin: 12.5 g/dL — ABNORMAL LOW (ref 13.0–17.0)
Immature Granulocytes: 0 %
Lymphocytes Relative: 40 %
Lymphs Abs: 2.2 10*3/uL (ref 0.7–4.0)
MCH: 29.9 pg (ref 26.0–34.0)
MCHC: 32.8 g/dL (ref 30.0–36.0)
MCV: 91.1 fL (ref 80.0–100.0)
Monocytes Absolute: 0.5 10*3/uL (ref 0.1–1.0)
Monocytes Relative: 9 %
Neutro Abs: 2.4 10*3/uL (ref 1.7–7.7)
Neutrophils Relative %: 45 %
Platelets: 233 10*3/uL (ref 150–400)
RBC: 4.18 MIL/uL — ABNORMAL LOW (ref 4.22–5.81)
RDW: 15.4 % (ref 11.5–15.5)
WBC: 5.3 10*3/uL (ref 4.0–10.5)
nRBC: 0 % (ref 0.0–0.2)

## 2023-10-18 LAB — LIPASE, BLOOD: Lipase: 31 U/L (ref 11–51)

## 2023-10-18 LAB — COMPREHENSIVE METABOLIC PANEL
ALT: 13 U/L (ref 0–44)
AST: 21 U/L (ref 15–41)
Albumin: 3.6 g/dL (ref 3.5–5.0)
Alkaline Phosphatase: 57 U/L (ref 38–126)
Anion gap: 12 (ref 5–15)
BUN: 22 mg/dL (ref 8–23)
CO2: 23 mmol/L (ref 22–32)
Calcium: 9 mg/dL (ref 8.9–10.3)
Chloride: 104 mmol/L (ref 98–111)
Creatinine, Ser: 2.2 mg/dL — ABNORMAL HIGH (ref 0.61–1.24)
GFR, Estimated: 30 mL/min — ABNORMAL LOW (ref >60)
Glucose, Bld: 91 mg/dL (ref 70–99)
Potassium: 4 mmol/L (ref 3.5–5.1)
Sodium: 139 mmol/L (ref 135–145)
Total Bilirubin: 0.7 mg/dL (ref 0.0–1.2)
Total Protein: 7.2 g/dL (ref 6.5–8.1)

## 2023-10-18 NOTE — ED Triage Notes (Signed)
Pt c/o right lower back painx4d. Pt denies injury. Pt c/o numbness in right lower back. Pt denies loss of bowel or bladder.

## 2023-10-18 NOTE — ED Provider Triage Note (Addendum)
Emergency Medicine Provider Triage Evaluation Note  [name] [name] , a 78 y.o. male  was evaluated in triage.  Pt complains of right lower back pain.  Started 4 days ago.  Denies trauma.  Denies urinary symptoms.  Denies fever.  States its numb in the area of his right lower back.  Denies lower extremity numbness, urinary or bowel changes. Denies fever.  Review of Systems  Positive: See above Negative: See above  Physical Exam  BP (!) 150/77   Pulse 60   Temp 98.7 F (37.1 C) (Oral)   Resp 19   Ht 5\' 10"  (1.778 m)   Wt 117.5 kg   SpO2 100%   BMI 37.17 kg/m  Gen:   Awake, no distress   Resp:  Normal effort  MSK:   Moves extremities without difficulty  Other:  Right CVA tenderness  Medical Decision Making  Medically screening exam initiated at 6:01 PM.  Appropriate orders placed.  [name] [name] was informed that the remainder of the evaluation will be completed by another provider, this initial triage assessment does not replace that evaluation, and the importance of remaining in the ED until their evaluation is complete.  Work up started     Gareth Eagle, [place] 10/18/23 [phone]

## 2023-10-19 LAB — URINALYSIS, ROUTINE W REFLEX MICROSCOPIC
Bilirubin Urine: NEGATIVE
Glucose, UA: NEGATIVE mg/dL
Hgb urine dipstick: NEGATIVE
Ketones, ur: NEGATIVE mg/dL
Leukocytes,Ua: NEGATIVE
Nitrite: NEGATIVE
Protein, ur: NEGATIVE mg/dL
Specific Gravity, Urine: 1.014 (ref 1.005–1.030)
pH: 5 (ref 5.0–8.0)

## 2023-10-19 MED ORDER — KETOROLAC TROMETHAMINE 15 MG/ML IJ SOLN
15.0000 mg | Freq: Once | INTRAMUSCULAR | Status: AC
  Administered 2023-10-19: 15 mg via INTRAVENOUS

## 2023-10-19 MED ORDER — DICLOFENAC SODIUM 1 % EX GEL: 4.0000 g | Freq: Four times a day (QID) | CUTANEOUS | 0 refills | Status: DC

## 2023-10-19 MED ORDER — LIDOCAINE 5 % EX PTCH: 1.0000 | MEDICATED_PATCH | CUTANEOUS | 0 refills | Status: AC

## 2023-10-19 MED ORDER — METHYLPREDNISOLONE 4 MG PO TBPK: ORAL_TABLET | ORAL | 0 refills | Status: DC

## 2023-10-19 MED ORDER — OXYCODONE HCL 5 MG PO TABS: 5.0000 mg | ORAL_TABLET | Freq: Four times a day (QID) | ORAL | 0 refills | Status: AC | PRN

## 2023-10-19 MED ORDER — LIDOCAINE 5 % EX PTCH
1.0000 | MEDICATED_PATCH | CUTANEOUS | Status: DC
  Administered 2023-10-19: 1 via TRANSDERMAL
  Filled 2023-10-19: qty 1

## 2023-10-19 MED ORDER — KETOROLAC TROMETHAMINE 15 MG/ML IJ SOLN
15.0000 mg | Freq: Once | INTRAMUSCULAR | Status: DC
  Filled 2023-10-19: qty 1

## 2023-10-19 MED ORDER — ONDANSETRON 4 MG PO TBDP: 4.0000 mg | ORAL_TABLET | Freq: Three times a day (TID) | ORAL | 0 refills | Status: AC | PRN

## 2023-10-19 MED ORDER — DEXAMETHASONE SODIUM PHOSPHATE 10 MG/ML IJ SOLN
10.0000 mg | Freq: Once | INTRAMUSCULAR | Status: AC
  Administered 2023-10-19: 10 mg via INTRAMUSCULAR
  Filled 2023-10-19: qty 1

## 2023-10-19 NOTE — ED Notes (Signed)
Pt verbalized understanding of discharge instructions. Pt ambulatory  at time of discharge. Pt wheeled from ed family to drive home

## 2023-10-19 NOTE — ED Provider Notes (Signed)
[place] EMERGENCY DEPARTMENT AT [facility] Provider Note  CSN: [id number] Arrival date & time: 10/18/23 1735  Chief Complaint(s) Back Pain  HPI [name] [name] is a 78 y.o. male with PMH CKD 3, GERD, TIA, paroxysmal A-fib who presents emergency room for evaluation of back pain.  States that symptoms began 4 days ago with sharp right-sided low back pain that radiates down the leg.  Denies associated neurologic complaints including numbness, tingling, weakness, saddle anesthesia or loss of bowel or bladder.  No trauma to the back.   Past Medical History Past Medical History:  Diagnosis Date   Arthritis    CKD (chronic kidney disease), stage III (HCC)    Coronary artery disease    GERD (gastroesophageal reflux disease)    Gout    Heart murmur    History of TIA (transient ischemic attack)    Hypertension    Inguinal hernia    Left   Kidney cysts    Left   LBBB (left bundle branch block)    Non-ischemic cardiomyopathy (HCC)    Paroxysmal atrial fibrillation (HCC)    Sleep apnea 2018   CPAP occasionally   Stroke [facility])    TIA   Patient Active Problem List   Diagnosis Date Noted   Left inguinal hernia 11/03/2021   Left bundle branch block 11/03/2021   Chronic kidney disease, stage III (moderate) (HCC) 11/03/2021   Preop cardiovascular exam 11/03/2021   Shortness of breath 02/10/2018   Cardiomyopathy (HCC) 02/10/2018   Abnormal stress test 02/10/2018   Paroxysmal atrial fibrillation (HCC) 02/10/2018   Home Medication(s) Prior to Admission medications   Medication Sig Start Date End Date Taking? Authorizing Provider  diclofenac Sodium (VOLTAREN) 1 % GEL Apply 4 g topically 4 (four) times daily. 10/19/23  Yes Karl Erway, MD  lidocaine (LIDODERM) 5 % Place 1 patch onto the skin daily. Remove & Discard patch within 12 hours or as directed by MD 10/19/23  Yes Rodarius Kichline, MD  methylPREDNISolone (MEDROL DOSEPAK) 4 MG TBPK tablet Take as prescribed 10/19/23   Yes Florabel Faulks, MD  ondansetron (ZOFRAN-ODT) 4 MG disintegrating tablet Take 1 tablet (4 mg total) by mouth every 8 (eight) hours as needed for nausea or vomiting. 10/19/23  Yes Liesl Simons, MD  oxyCODONE (ROXICODONE) 5 MG immediate release tablet Take 1 tablet (5 mg total) by mouth every 6 (six) hours as needed for breakthrough pain. 10/19/23  Yes Elmin Wiederholt, MD  acetaminophen (TYLENOL) 325 MG tablet Take 325 mg by mouth every 6 (six) hours as needed for mild pain or fever.    [provider]  allopurinol (ZYLOPRIM) 300 MG tablet Take 300 mg by mouth daily.    [provider]  amLODipine (NORVASC) 10 MG tablet Take 10 mg by mouth daily. 09/13/14   [provider]  atorvastatin (LIPITOR) 10 MG tablet Take 2 tablets (20 mg total) by mouth once a week. 04/26/23   Swinyer, Zachary George, NP  BISACODYL 5 MG EC tablet Take 20 mg by mouth as directed. 06/02/23   [provider]  cetirizine (ZYRTEC ALLERGY) 10 MG tablet Take 1 tablet (10 mg total) by mouth daily. 08/02/20   Wallis Bamberg, PA-C  colchicine 0.6 MG tablet Take 0.6 mg by mouth daily as needed (for gout).     [provider]  dextromethorphan-guaiFENesin (MUCINEX DM) 30-600 MG 12hr tablet Take 1 tablet by mouth 2 (two) times daily. 10/22/22   Ellsworth Lennox, PA-C  ezetimibe (ZETIA) 10 MG  tablet Take 10 mg by mouth daily. 12/07/21   [provider]  fluticasone (FLONASE) 50 MCG/ACT nasal spray Place 1 spray into both nostrils at bedtime.    [provider]  gabapentin (NEURONTIN) 300 MG capsule Take 300 mg by mouth 3 (three) times daily.    [provider]  hydrochlorothiazide (HYDRODIURIL) 25 MG tablet Take 25 mg by mouth daily. 12/30/17   [provider]  hydrocortisone (ANUSOL-HC) 2.5 % rectal cream Apply 1 Application topically 2 (two) times daily. Patient not taking: Reported on 08/29/2023 05/18/23   [provider]  hydrocortisone (ANUSOL-HC) 25 MG  suppository Place 1 suppository (25 mg total) rectally 2 (two) times daily. Patient not taking: Reported on 08/29/2023 05/07/23   Henderly, Britni A, PA-C  hydrocortisone 2.5 % cream Apply 1 application. topically 2 (two) times daily as needed (irritation). Patient not taking: Reported on 08/29/2023    [provider]  ipratropium (ATROVENT) 0.03 % nasal spray Place 2 sprays into both nostrils at bedtime as needed for rhinitis. 02/15/23   [provider]  irbesartan (AVAPRO) 300 MG tablet Take 300 mg by mouth every morning. 09/01/18   [provider]  linaclotide (LINZESS) 72 MCG capsule Take 72 mcg by mouth daily as needed (Constipation). Patient not taking: Reported on 08/29/2023    [provider]  metoprolol succinate (TOPROL-XL) 50 MG 24 hr tablet Take 1 tablet (50 mg total) by mouth daily. Take with or immediately following a meal. 09/07/23   Jake Bathe, MD  Multiple Vitamin (MULTIVITAMIN WITH MINERALS) TABS tablet Take 1 tablet by mouth daily.    [provider]  omeprazole (PRILOSEC) 40 MG capsule Take 40 mg by mouth daily. Patient not taking: Reported on 08/29/2023    [provider]  polyethylene glycol (MIRALAX / GLYCOLAX) 17 g packet Take 17 g by mouth daily as needed for moderate constipation.    [provider]  polyethylene glycol-electrolytes (NULYTELY) 420 g solution Take 4,000 mLs by mouth See admin instructions. Patient not taking: Reported on 08/29/2023 06/02/23   [provider]  promethazine (PHENERGAN) 25 MG tablet Take 25 mg by mouth every 6 (six) hours as needed for nausea or vomiting.    [provider]  promethazine-dextromethorphan (PROMETHAZINE-DM) 6.25-15 MG/5ML syrup Take 5 mLs by mouth 4 (four) times daily as needed for cough. 08/29/23   Tomi Bamberger, PA-C  Rivaroxaban (XARELTO) 15 MG TABS tablet Take 1 tablet (15 mg total) by mouth daily with supper. 05/05/23   Jake Bathe, MD   tadalafil (CIALIS) 5 MG tablet Take 5 mg by mouth daily as needed. 06/16/23   [provider]  tamsulosin (FLOMAX) 0.4 MG CAPS capsule Take 0.4 mg by mouth daily.    [provider]  traMADol (ULTRAM) 50 MG tablet Take 50 mg by mouth every 6 (six) hours as needed for severe pain.    [provider]  witch hazel-glycerin (TUCKS) pad Apply 1 Application topically as needed for itching. Patient not taking: Reported on 08/29/2023 05/07/23   Henderly, Britni A, PA-C  Past Surgical History Past Surgical History:  Procedure Laterality Date   COLONOSCOPY     HERNIA REPAIR Right    Inguinal Hernia   NASAL TURBINATE REDUCTION Bilateral 03/30/2023   Procedure: TURBINATE REDUCTION/SUBMUCOSAL RESECTION;  Surgeon: [name], MD;  Location: [facility] OR;  Service: ENT;  Laterality: Bilateral;   RIGHT/LEFT HEART CATH AND CORONARY ANGIOGRAPHY N/A 02/10/2018   Procedure: RIGHT/LEFT HEART CATH AND CORONARY ANGIOGRAPHY;  Surgeon: Yvonne Kendall, MD;  Location: [facility] INVASIVE CV LAB;  Service: Cardiovascular;  Laterality: N/A;   SINUS ENDO WITH FUSION Bilateral 03/30/2023   Procedure: BILATERAL ANTERIOR ETHMOIDECTOMY, BILATERAL MAXILLARY ANTROSTOMY, FUSION IMAGE GUIDANCE;  Surgeon: [name], MD;  Location: [facility] OR;  Service: ENT;  Laterality: Bilateral;   XI ROBOTIC ASSISTED INGUINAL HERNIA REPAIR WITH MESH Left 01/12/2022   Procedure: XI ROBOTIC ASSISTED LEFT INGUINAL HERNIA REPAIR WITH MESH;  Surgeon: Kinsinger, De Blanch, MD;  Location: [facility] ORS;  Service: General;  Laterality: Left;   Family History Family History  Problem Relation Age of Onset   Cancer Mother    Hypertension Mother    Heart disease Father    Hypertension Father    Hypertension Sister    Hypertension Brother     Social History Social History   Tobacco Use   Smoking status: Never    Smokeless tobacco: Never   Tobacco comments:    Quit smoking 1999  Vaping Use   Vaping status: Never Used  Substance Use Topics   Alcohol use: No   Drug use: No   Allergies Benazepril, Pravastatin, Prednisone, and Valsartan  Review of Systems Review of Systems  Musculoskeletal:  Positive for back pain.    Physical Exam Vital Signs  I have reviewed the triage vital signs BP (!) 150/77 (BP Location: Right Arm)   Pulse 63   Temp 98 F (36.7 C) (Oral)   Resp 16   Ht 5\' 10"  (1.778 m)   Wt 117.5 kg   SpO2 100%   BMI 37.17 kg/m   Physical Exam Constitutional:      General: He is not in acute distress.    Appearance: Normal appearance.  HENT:     Head: Normocephalic and atraumatic.     Nose: No congestion or rhinorrhea.  Eyes:     General:        Right eye: No discharge.        Left eye: No discharge.     Extraocular Movements: Extraocular movements intact.     Pupils: Pupils are equal, round, and reactive to light.  Cardiovascular:     Rate and Rhythm: Normal rate and regular rhythm.     Heart sounds: No murmur heard. Pulmonary:     Effort: No respiratory distress.     Breath sounds: No wheezing or rales.  Abdominal:     General: There is no distension.     Tenderness: There is no abdominal tenderness.  Musculoskeletal:        General: Tenderness present. Normal range of motion.     Cervical back: Normal range of motion.  Skin:    General: Skin is warm and dry.  Neurological:     General: No focal deficit present.     Mental Status: He is alert.     Cranial Nerves: No cranial nerve deficit.     Sensory: No sensory deficit.     Motor: No weakness.     ED Results and Treatments Labs (all labs ordered are listed, but only abnormal results are  displayed) Labs Reviewed  CBC WITH DIFFERENTIAL/PLATELET - Abnormal; Notable for the following components:      Result Value   RBC 4.18 (*)    Hemoglobin 12.5 (*)    HCT 38.1 (*)    All other components within  normal limits  COMPREHENSIVE METABOLIC PANEL - Abnormal; Notable for the following components:   Creatinine, Ser 2.20 (*)    GFR, Estimated 30 (*)    All other components within normal limits  LIPASE, BLOOD  URINALYSIS, ROUTINE W REFLEX MICROSCOPIC                                                                                                                          Radiology CT Renal Stone Study Result Date: 10/18/2023 CLINICAL DATA:  Flank and back pain, stone suspected EXAM: CT ABDOMEN AND PELVIS WITHOUT CONTRAST CT LUMBAR SPINE WITHOUT CONTRAST TECHNIQUE: Multidetector CT imaging of the abdomen and pelvis was performed following the standard protocol without IV contrast. Multidetector CT imaging of the lumbar spine was performed following the standard protocol without IV contrast. RADIATION DOSE REDUCTION: This exam was performed according to the departmental dose-optimization program which includes automated exposure control, adjustment of the mA and/or kV according to patient size and/or use of iterative reconstruction technique. COMPARISON:  CT abdomen pelvis, 05/07/2023 FINDINGS: CT ABDOMEN PELVIS FINDINGS Lower chest: No acute abnormality. Bibasilar scarring or atelectasis. Hepatobiliary: No solid liver abnormality is seen. No gallstones, gallbladder wall thickening, or biliary dilatation. Pancreas: Unremarkable. No pancreatic ductal dilatation or surrounding inflammatory changes. Spleen: Normal in size without significant abnormality. Adrenals/Urinary Tract: Adrenal glands are unremarkable. Multiple bilateral fluid attenuation renal cortical cysts, benign, requiring no further follow-up or characterization. Kidneys are otherwise normal, without renal calculi, solid lesion, or hydronephrosis. Bladder is unremarkable. Stomach/Bowel: Stomach is within normal limits. Appendix appears normal. No evidence of bowel wall thickening, distention, or inflammatory changes. Colonic diverticulosis.  Vascular/Lymphatic: Aortic atherosclerosis. No enlarged abdominal or pelvic lymph nodes. Reproductive: Prostatomegaly. Other: Inguinal hernia mesh repair.  No ascites. Musculoskeletal: No acute or significant osseous findings. Asymmetric partial ankylosis of the right sacroiliac joint. CT LUMBAR SPINE FINDINGS Alignment: Minimal degenerative anterolisthesis of L4 on L5. Otherwise normal lumbar lordosis. Vertebral bodies: Intact. No fracture or dislocation. Disc spaces: Minimal multilevel disc space height loss and endplate osteophytosis. Mild facet degenerative change. Paraspinous soft tissues: Unremarkable. IMPRESSION: 1. No acute noncontrast CT findings of the abdomen or pelvis to explain flank pain. No urinary tract calculi or hydronephrosis. 2. No fracture or dislocation of the lumbar spine. Minimal multilevel disc degenerative disease. 3. Prostatomegaly. 4. Colonic diverticulosis without evidence of acute diverticulitis. Aortic Atherosclerosis (ICD10-I70.0). Electronically Signed   By: Jearld Lesch M.D.   On: 10/18/2023 21:20   CT L-SPINE NO CHARGE Result Date: 10/18/2023 CLINICAL DATA:  Flank and back pain, stone suspected EXAM: CT ABDOMEN AND PELVIS WITHOUT CONTRAST CT LUMBAR SPINE WITHOUT CONTRAST TECHNIQUE: Multidetector CT imaging of the abdomen and pelvis was performed following the standard  protocol without IV contrast. Multidetector CT imaging of the lumbar spine was performed following the standard protocol without IV contrast. RADIATION DOSE REDUCTION: This exam was performed according to the departmental dose-optimization program which includes automated exposure control, adjustment of the mA and/or kV according to patient size and/or use of iterative reconstruction technique. COMPARISON:  CT abdomen pelvis, 05/07/2023 FINDINGS: CT ABDOMEN PELVIS FINDINGS Lower chest: No acute abnormality. Bibasilar scarring or atelectasis. Hepatobiliary: No solid liver abnormality is seen. No gallstones,  gallbladder wall thickening, or biliary dilatation. Pancreas: Unremarkable. No pancreatic ductal dilatation or surrounding inflammatory changes. Spleen: Normal in size without significant abnormality. Adrenals/Urinary Tract: Adrenal glands are unremarkable. Multiple bilateral fluid attenuation renal cortical cysts, benign, requiring no further follow-up or characterization. Kidneys are otherwise normal, without renal calculi, solid lesion, or hydronephrosis. Bladder is unremarkable. Stomach/Bowel: Stomach is within normal limits. Appendix appears normal. No evidence of bowel wall thickening, distention, or inflammatory changes. Colonic diverticulosis. Vascular/Lymphatic: Aortic atherosclerosis. No enlarged abdominal or pelvic lymph nodes. Reproductive: Prostatomegaly. Other: Inguinal hernia mesh repair.  No ascites. Musculoskeletal: No acute or significant osseous findings. Asymmetric partial ankylosis of the right sacroiliac joint. CT LUMBAR SPINE FINDINGS Alignment: Minimal degenerative anterolisthesis of L4 on L5. Otherwise normal lumbar lordosis. Vertebral bodies: Intact. No fracture or dislocation. Disc spaces: Minimal multilevel disc space height loss and endplate osteophytosis. Mild facet degenerative change. Paraspinous soft tissues: Unremarkable. IMPRESSION: 1. No acute noncontrast CT findings of the abdomen or pelvis to explain flank pain. No urinary tract calculi or hydronephrosis. 2. No fracture or dislocation of the lumbar spine. Minimal multilevel disc degenerative disease. 3. Prostatomegaly. 4. Colonic diverticulosis without evidence of acute diverticulitis. Aortic Atherosclerosis (ICD10-I70.0). Electronically Signed   By: Jearld Lesch M.D.   On: 10/18/2023 21:20    Pertinent labs & imaging results that were available during my care of the patient were reviewed by me and considered in my medical decision making (see MDM for details).  Medications Ordered in ED Medications  dexamethasone  (DECADRON) injection 10 mg (10 mg Intramuscular Given 10/19/23 0526)  ketorolac (TORADOL) 15 MG/ML injection 15 mg (15 mg Intravenous Given 10/19/23 0535)                                                                                                                                     Procedures Procedures  (including critical care time)  Medical Decision Making / ED Course   This patient presents to the ED for concern of back pain, this involves an extensive number of treatment options, and is a complaint that carries with it a high risk of complications and morbidity.  The differential diagnosis includes muscular strain/spasm, lumbago, disc herniation, spinal fracture, cauda equina, epidural abscess or hematoma, psoas abscess, pyelonephritis,   MDM: Patient seen emergency room for evaluation of back pain.  Physical exam with paraspinal muscle tenderness in the lumbar spine on the right with positive straight leg  test but neurologic exam otherwise unremarkable including no saddle anesthesia, no appreciable motor or sensory deficits.  Laboratory evaluation with hemoglobin 12.5, creatinine 2.20, CT stone study and CT L-spine reassuringly negative for acute pathology.  Patient pain controlled with Toradol, Decadron, Lidoderm.  On evaluation, patient pain significant proved and he is able to ambulate without difficulty.  Given patient's GFR he is not a candidate for longer-term NSAID use but in acute setting some of those Toradol is unlikely to significant damage patient's kidneys or interact with his Xarelto.  I did discourage NSAID use at home.  We will pain control with Lidoderm, Medrol, Tylenol with oxycodone for breakthrough pain.  At this time patient presentation consistent with sciatica and he will be discharged with outpatient follow-up.  Currently does not meet inpatient criteria for admission.   Additional history obtained: -Additional history obtained from family -External records from  outside source obtained and reviewed including: Chart review including previous notes, labs, imaging, consultation notes   Lab Tests: -I ordered, reviewed, and interpreted labs.   The pertinent results include:   Labs Reviewed  CBC WITH DIFFERENTIAL/PLATELET - Abnormal; Notable for the following components:      Result Value   RBC 4.18 (*)    Hemoglobin 12.5 (*)    HCT 38.1 (*)    All other components within normal limits  COMPREHENSIVE METABOLIC PANEL - Abnormal; Notable for the following components:   Creatinine, Ser 2.20 (*)    GFR, Estimated 30 (*)    All other components within normal limits  LIPASE, BLOOD  URINALYSIS, ROUTINE W REFLEX MICROSCOPIC      Imaging Studies ordered: I ordered imaging studies including CT stone study, CT L-spine I independently visualized and interpreted imaging. I agree with the radiologist interpretation   Medicines ordered and prescription drug management: Meds ordered this encounter  Medications   DISCONTD: ketorolac (TORADOL) 15 MG/ML injection 15 mg   dexamethasone (DECADRON) injection 10 mg   DISCONTD: lidocaine (LIDODERM) 5 % 1 patch   ketorolac (TORADOL) 15 MG/ML injection 15 mg   methylPREDNISolone (MEDROL DOSEPAK) 4 MG TBPK tablet    Sig: Take as prescribed    Dispense:  1 each    Refill:  0   ondansetron (ZOFRAN-ODT) 4 MG disintegrating tablet    Sig: Take 1 tablet (4 mg total) by mouth every 8 (eight) hours as needed for nausea or vomiting.    Dispense:  20 tablet    Refill:  0   diclofenac Sodium (VOLTAREN) 1 % GEL    Sig: Apply 4 g topically 4 (four) times daily.    Dispense:  50 g    Refill:  0   lidocaine (LIDODERM) 5 %    Sig: Place 1 patch onto the skin daily. Remove & Discard patch within 12 hours or as directed by MD    Dispense:  30 patch    Refill:  0   oxyCODONE (ROXICODONE) 5 MG immediate release tablet    Sig: Take 1 tablet (5 mg total) by mouth every 6 (six) hours as needed for breakthrough pain.     Dispense:  10 tablet    Refill:  0    -I have reviewed the patients home medicines and have made adjustments as needed  Critical interventions none    Social Determinants of Health:  Factors impacting patients care include: none   Reevaluation: After the interventions noted above, I reevaluated the patient and found that they have :improved  Co morbidities that  complicate the patient evaluation  Past Medical History:  Diagnosis Date   Arthritis    CKD (chronic kidney disease), stage III (HCC)    Coronary artery disease    GERD (gastroesophageal reflux disease)    Gout    Heart murmur    History of TIA (transient ischemic attack)    Hypertension    Inguinal hernia    Left   Kidney cysts    Left   LBBB (left bundle branch block)    Non-ischemic cardiomyopathy (HCC)    Paroxysmal atrial fibrillation (HCC)    Sleep apnea 2018   CPAP occasionally   Stroke [facility])    TIA      Dispostion: I considered admission for this patient, but at this time he does not meet inpatient criteria for admission and will be discharged with outpatient follow-up     Final Clinical Impression(s) / ED Diagnoses Final diagnoses:  Sciatica of right side     @PCDICTATION @    Glendora Score, MD 10/19/23 1929

## 2023-11-08 DIAGNOSIS — M545 Low back pain, unspecified: Secondary | ICD-10-CM | POA: Diagnosis not present

## 2023-11-18 DIAGNOSIS — M545 Low back pain, unspecified: Secondary | ICD-10-CM | POA: Diagnosis not present

## 2023-11-21 DIAGNOSIS — M47816 Spondylosis without myelopathy or radiculopathy, lumbar region: Secondary | ICD-10-CM | POA: Diagnosis not present

## 2023-11-29 DIAGNOSIS — N281 Cyst of kidney, acquired: Secondary | ICD-10-CM | POA: Diagnosis not present

## 2023-11-29 DIAGNOSIS — N1832 Chronic kidney disease, stage 3b: Secondary | ICD-10-CM | POA: Diagnosis not present

## 2023-11-29 DIAGNOSIS — M5416 Radiculopathy, lumbar region: Secondary | ICD-10-CM | POA: Diagnosis not present

## 2023-12-01 DIAGNOSIS — M47816 Spondylosis without myelopathy or radiculopathy, lumbar region: Secondary | ICD-10-CM | POA: Diagnosis not present

## 2023-12-06 DIAGNOSIS — I129 Hypertensive chronic kidney disease with stage 1 through stage 4 chronic kidney disease, or unspecified chronic kidney disease: Secondary | ICD-10-CM | POA: Diagnosis not present

## 2023-12-06 DIAGNOSIS — M1000 Idiopathic gout, unspecified site: Secondary | ICD-10-CM | POA: Diagnosis not present

## 2023-12-06 DIAGNOSIS — N1832 Chronic kidney disease, stage 3b: Secondary | ICD-10-CM | POA: Diagnosis not present

## 2023-12-06 DIAGNOSIS — I480 Paroxysmal atrial fibrillation: Secondary | ICD-10-CM | POA: Diagnosis not present

## 2023-12-06 DIAGNOSIS — E785 Hyperlipidemia, unspecified: Secondary | ICD-10-CM | POA: Diagnosis not present

## 2023-12-30 DIAGNOSIS — M47816 Spondylosis without myelopathy or radiculopathy, lumbar region: Secondary | ICD-10-CM | POA: Diagnosis not present

## 2024-01-26 ENCOUNTER — Other Ambulatory Visit (HOSPITAL_COMMUNITY): Payer: Self-pay

## 2024-01-27 DIAGNOSIS — N281 Cyst of kidney, acquired: Secondary | ICD-10-CM | POA: Diagnosis not present

## 2024-01-30 ENCOUNTER — Other Ambulatory Visit (HOSPITAL_COMMUNITY): Payer: Self-pay

## 2024-01-30 MED ORDER — HYDROCHLOROTHIAZIDE 25 MG PO TABS: 25.0000 mg | ORAL_TABLET | Freq: Every morning | ORAL | 5 refills | Status: DC

## 2024-01-30 MED ORDER — RIVAROXABAN 15 MG PO TABS: 15.0000 mg | ORAL_TABLET | Freq: Every evening | ORAL | 10 refills | Status: DC

## 2024-01-30 MED ORDER — METOPROLOL SUCCINATE ER 50 MG PO TB24: 50.0000 mg | ORAL_TABLET | Freq: Every day | ORAL | 1 refills | Status: DC

## 2024-01-30 MED ORDER — COLCHICINE 0.6 MG PO TABS: 1.2000 mg | ORAL_TABLET | Freq: Every day | ORAL | 1 refills | Status: AC

## 2024-01-30 MED ORDER — IRBESARTAN 300 MG PO TABS: 300.0000 mg | ORAL_TABLET | Freq: Every day | ORAL | 1 refills | Status: DC

## 2024-01-30 MED ORDER — ATORVASTATIN CALCIUM 10 MG PO TABS: 10.0000 mg | ORAL_TABLET | ORAL | 0 refills | Status: DC

## 2024-01-30 MED ORDER — METOPROLOL SUCCINATE ER 50 MG PO TB24: 50.0000 mg | ORAL_TABLET | Freq: Every day | ORAL | 3 refills | Status: DC

## 2024-01-30 MED ORDER — DICLOFENAC SODIUM 1 % EX GEL: CUTANEOUS | 1 refills | Status: AC

## 2024-01-30 MED ORDER — AMLODIPINE BESYLATE 10 MG PO TABS: 10.0000 mg | ORAL_TABLET | Freq: Every morning | ORAL | 1 refills | Status: DC

## 2024-01-30 MED ORDER — TAMSULOSIN HCL 0.4 MG PO CAPS: 0.4000 mg | ORAL_CAPSULE | Freq: Every day | ORAL | 0 refills | Status: DC

## 2024-01-30 MED ORDER — EZETIMIBE 10 MG PO TABS: 10.0000 mg | ORAL_TABLET | Freq: Every day | ORAL | 1 refills | Status: DC

## 2024-01-30 MED ORDER — OMEPRAZOLE 40 MG PO CPDR: 40.0000 mg | DELAYED_RELEASE_CAPSULE | Freq: Every day | ORAL | 0 refills | Status: DC

## 2024-01-30 MED ORDER — PROMETHAZINE HCL 25 MG PO TABS: 25.0000 mg | ORAL_TABLET | Freq: Two times a day (BID) | ORAL | 1 refills | Status: DC

## 2024-01-30 MED ORDER — ALLOPURINOL 300 MG PO TABS: 300.0000 mg | ORAL_TABLET | Freq: Every day | ORAL | 1 refills | Status: DC

## 2024-02-10 ENCOUNTER — Other Ambulatory Visit (HOSPITAL_COMMUNITY): Payer: Self-pay

## 2024-02-10 DIAGNOSIS — B0229 Other postherpetic nervous system involvement: Secondary | ICD-10-CM | POA: Diagnosis not present

## 2024-02-10 DIAGNOSIS — N281 Cyst of kidney, acquired: Secondary | ICD-10-CM | POA: Diagnosis not present

## 2024-02-10 DIAGNOSIS — N1832 Chronic kidney disease, stage 3b: Secondary | ICD-10-CM | POA: Diagnosis not present

## 2024-02-10 DIAGNOSIS — N400 Enlarged prostate without lower urinary tract symptoms: Secondary | ICD-10-CM | POA: Diagnosis not present

## 2024-02-10 DIAGNOSIS — I480 Paroxysmal atrial fibrillation: Secondary | ICD-10-CM | POA: Diagnosis not present

## 2024-02-10 DIAGNOSIS — I129 Hypertensive chronic kidney disease with stage 1 through stage 4 chronic kidney disease, or unspecified chronic kidney disease: Secondary | ICD-10-CM | POA: Diagnosis not present

## 2024-02-10 DIAGNOSIS — I428 Other cardiomyopathies: Secondary | ICD-10-CM | POA: Diagnosis not present

## 2024-02-10 DIAGNOSIS — K2950 Unspecified chronic gastritis without bleeding: Secondary | ICD-10-CM | POA: Diagnosis not present

## 2024-02-10 DIAGNOSIS — K5900 Constipation, unspecified: Secondary | ICD-10-CM | POA: Diagnosis not present

## 2024-02-10 DIAGNOSIS — M109 Gout, unspecified: Secondary | ICD-10-CM | POA: Diagnosis not present

## 2024-02-10 DIAGNOSIS — M48061 Spinal stenosis, lumbar region without neurogenic claudication: Secondary | ICD-10-CM | POA: Diagnosis not present

## 2024-02-10 DIAGNOSIS — E7800 Pure hypercholesterolemia, unspecified: Secondary | ICD-10-CM | POA: Diagnosis not present

## 2024-02-10 MED ORDER — GABAPENTIN 300 MG PO CAPS
300.0000 mg | ORAL_CAPSULE | Freq: Three times a day (TID) | ORAL | 1 refills | Status: DC
  Filled 2024-02-10 – 2024-02-17 (×2): qty 270, 90d supply, fill #0
  Filled 2024-03-19 – 2024-04-26 (×2): qty 270, 90d supply, fill #1
  Filled 2024-04-26 – 2024-05-03 (×2): qty 90, 30d supply, fill #1
  Filled 2024-05-31 – 2024-06-05 (×2): qty 90, 30d supply, fill #2
  Filled 2024-07-10: qty 90, 30d supply, fill #3

## 2024-02-10 MED ORDER — IRBESARTAN 300 MG PO TABS
300.0000 mg | ORAL_TABLET | Freq: Every day | ORAL | 1 refills | Status: DC
  Filled 2024-02-10 – 2024-02-17 (×2): qty 90, 90d supply, fill #0
  Filled 2024-03-19: qty 90, 90d supply, fill #1
  Filled 2024-04-26: qty 30, 30d supply, fill #1
  Filled 2024-04-26: qty 90, 90d supply, fill #1
  Filled 2024-05-03: qty 30, 30d supply, fill #1
  Filled 2024-05-31 – 2024-06-05 (×2): qty 30, 30d supply, fill #2
  Filled 2024-07-10: qty 30, 30d supply, fill #3

## 2024-02-10 MED ORDER — HYDROCHLOROTHIAZIDE 25 MG PO TABS
25.0000 mg | ORAL_TABLET | Freq: Every morning | ORAL | 1 refills | Status: DC
  Filled 2024-02-10 – 2024-02-17 (×2): qty 90, 90d supply, fill #0
  Filled 2024-03-19: qty 90, 90d supply, fill #1
  Filled 2024-04-26: qty 30, 30d supply, fill #1
  Filled 2024-04-26: qty 90, 90d supply, fill #1
  Filled 2024-05-03: qty 30, 30d supply, fill #1
  Filled 2024-05-31 – 2024-06-05 (×2): qty 30, 30d supply, fill #2
  Filled 2024-07-10: qty 30, 30d supply, fill #3

## 2024-02-10 MED ORDER — AMLODIPINE BESYLATE 10 MG PO TABS
10.0000 mg | ORAL_TABLET | Freq: Every morning | ORAL | 1 refills | Status: DC
  Filled 2024-02-10 – 2024-02-17 (×2): qty 90, 90d supply, fill #0
  Filled 2024-03-19 – 2024-04-26 (×2): qty 90, 90d supply, fill #1
  Filled 2024-04-26 – 2024-05-03 (×2): qty 30, 30d supply, fill #1
  Filled 2024-05-31 – 2024-06-05 (×2): qty 30, 30d supply, fill #2
  Filled 2024-07-10: qty 30, 30d supply, fill #3

## 2024-02-10 MED ORDER — ATORVASTATIN CALCIUM 10 MG PO TABS
10.0000 mg | ORAL_TABLET | ORAL | 1 refills | Status: DC
  Filled 2024-02-10 – 2024-02-17 (×2): qty 13, 91d supply, fill #0
  Filled 2024-03-19 – 2024-04-26 (×2): qty 13, 91d supply, fill #1
  Filled 2024-04-26: qty 4, 28d supply, fill #1
  Filled 2024-05-03: qty 13, 91d supply, fill #1

## 2024-02-10 MED ORDER — METOPROLOL SUCCINATE ER 50 MG PO TB24
50.0000 mg | ORAL_TABLET | Freq: Every day | ORAL | 1 refills | Status: DC
  Filled 2024-02-10 – 2024-02-17 (×2): qty 90, 90d supply, fill #0
  Filled 2024-03-19 – 2024-04-26 (×2): qty 90, 90d supply, fill #1

## 2024-02-10 MED ORDER — ALLOPURINOL 300 MG PO TABS
300.0000 mg | ORAL_TABLET | Freq: Every day | ORAL | 1 refills | Status: DC
  Filled 2024-02-10 – 2024-02-17 (×2): qty 90, 90d supply, fill #0
  Filled 2024-03-19: qty 90, 90d supply, fill #1
  Filled 2024-04-26: qty 30, 30d supply, fill #1
  Filled 2024-04-26: qty 90, 90d supply, fill #1
  Filled 2024-05-03: qty 30, 30d supply, fill #1
  Filled 2024-05-31 – 2024-06-05 (×2): qty 30, 30d supply, fill #2
  Filled 2024-07-10: qty 30, 30d supply, fill #3

## 2024-02-10 MED ORDER — EZETIMIBE 10 MG PO TABS
10.0000 mg | ORAL_TABLET | Freq: Every day | ORAL | 1 refills | Status: DC
  Filled 2024-02-10 – 2024-02-17 (×2): qty 90, 90d supply, fill #0
  Filled 2024-03-19: qty 90, 90d supply, fill #1
  Filled 2024-04-26: qty 30, 30d supply, fill #1
  Filled 2024-04-26: qty 90, 90d supply, fill #1
  Filled 2024-05-03: qty 30, 30d supply, fill #1
  Filled 2024-05-31 – 2024-06-05 (×2): qty 30, 30d supply, fill #2
  Filled 2024-07-10: qty 30, 30d supply, fill #3

## 2024-02-10 MED ORDER — OMEPRAZOLE 40 MG PO CPDR
40.0000 mg | DELAYED_RELEASE_CAPSULE | Freq: Every day | ORAL | 1 refills | Status: DC
  Filled 2024-02-10 – 2024-02-17 (×2): qty 90, 90d supply, fill #0
  Filled 2024-03-19 – 2024-04-26 (×2): qty 90, 90d supply, fill #1
  Filled 2024-04-26 – 2024-05-03 (×2): qty 30, 30d supply, fill #1
  Filled 2024-05-31 – 2024-06-05 (×2): qty 30, 30d supply, fill #2
  Filled 2024-07-10: qty 30, 30d supply, fill #3

## 2024-02-10 MED ORDER — TAMSULOSIN HCL 0.4 MG PO CAPS
0.4000 mg | ORAL_CAPSULE | Freq: Every day | ORAL | 1 refills | Status: DC
  Filled 2024-02-10 – 2024-02-17 (×2): qty 90, 90d supply, fill #0
  Filled 2024-03-19 – 2024-04-26 (×2): qty 90, 90d supply, fill #1
  Filled 2024-04-26 – 2024-05-03 (×2): qty 30, 30d supply, fill #1
  Filled 2024-05-31 – 2024-06-05 (×2): qty 30, 30d supply, fill #2
  Filled 2024-07-10: qty 30, 30d supply, fill #3

## 2024-02-14 ENCOUNTER — Other Ambulatory Visit (HOSPITAL_COMMUNITY): Payer: Self-pay

## 2024-02-14 ENCOUNTER — Telehealth: Payer: Self-pay | Admitting: Cardiology

## 2024-02-14 ENCOUNTER — Other Ambulatory Visit: Payer: Self-pay | Admitting: *Deleted

## 2024-02-14 DIAGNOSIS — I480 Paroxysmal atrial fibrillation: Secondary | ICD-10-CM

## 2024-02-14 MED ORDER — RIVAROXABAN 15 MG PO TABS
15.0000 mg | ORAL_TABLET | Freq: Every evening | ORAL | 5 refills | Status: DC
  Filled 2024-02-14 – 2024-02-17 (×2): qty 30, 30d supply, fill #0
  Filled 2024-03-19: qty 60, 60d supply, fill #1
  Filled 2024-04-26 – 2024-05-03 (×2): qty 30, 30d supply, fill #2
  Filled 2024-05-31 – 2024-06-05 (×2): qty 30, 30d supply, fill #3
  Filled 2024-07-10: qty 30, 30d supply, fill #4

## 2024-02-14 NOTE — Telephone Encounter (Signed)
 Xarelto  15mg  refill request received. Pt is 78 years old, weight-117.5kg, Crea-2.20 on 10/18/23, last seen by Slater Duncan on 07/12/23, Diagnosis-Afib, CrCl-47.47 mL/min; Dose is appropriate based on dosing criteria. Will send in refill to requested pharmacy.

## 2024-02-14 NOTE — Telephone Encounter (Signed)
*  STAT* If patient is at the pharmacy, call can be transferred to refill team.   1. Which medications need to be refilled? (please list name of each medication and dose if known)   Rivaroxaban  (XARELTO ) 15 MG TABS tablet    2. Which pharmacy/location (including street and city if local pharmacy) is medication to be sent to? [facility] Phone: [phone]  Fax: [phone]      3. Do they need a 30 day or 90 day supply? 30

## 2024-02-15 ENCOUNTER — Other Ambulatory Visit (HOSPITAL_COMMUNITY): Payer: Self-pay

## 2024-02-17 ENCOUNTER — Other Ambulatory Visit: Payer: Self-pay

## 2024-02-17 ENCOUNTER — Other Ambulatory Visit (HOSPITAL_COMMUNITY): Payer: Self-pay

## 2024-02-20 ENCOUNTER — Other Ambulatory Visit (HOSPITAL_COMMUNITY): Payer: Self-pay

## 2024-02-20 MED ORDER — AMOXICILLIN 500 MG PO CAPS
500.0000 mg | ORAL_CAPSULE | Freq: Three times a day (TID) | ORAL | 0 refills | Status: DC
  Filled 2024-02-20: qty 21, 7d supply, fill #0

## 2024-02-22 ENCOUNTER — Other Ambulatory Visit: Payer: Self-pay

## 2024-02-27 DIAGNOSIS — I129 Hypertensive chronic kidney disease with stage 1 through stage 4 chronic kidney disease, or unspecified chronic kidney disease: Secondary | ICD-10-CM | POA: Diagnosis not present

## 2024-02-27 DIAGNOSIS — I428 Other cardiomyopathies: Secondary | ICD-10-CM | POA: Diagnosis not present

## 2024-02-27 DIAGNOSIS — N1832 Chronic kidney disease, stage 3b: Secondary | ICD-10-CM | POA: Diagnosis not present

## 2024-03-06 DIAGNOSIS — H40033 Anatomical narrow angle, bilateral: Secondary | ICD-10-CM | POA: Diagnosis not present

## 2024-03-06 DIAGNOSIS — H2513 Age-related nuclear cataract, bilateral: Secondary | ICD-10-CM | POA: Diagnosis not present

## 2024-03-19 ENCOUNTER — Other Ambulatory Visit: Payer: Self-pay

## 2024-03-23 ENCOUNTER — Other Ambulatory Visit: Payer: Self-pay

## 2024-03-26 ENCOUNTER — Other Ambulatory Visit: Payer: Self-pay

## 2024-03-26 ENCOUNTER — Encounter: Payer: Self-pay | Admitting: Cardiology

## 2024-03-26 ENCOUNTER — Other Ambulatory Visit (HOSPITAL_COMMUNITY): Payer: Self-pay

## 2024-03-26 ENCOUNTER — Ambulatory Visit: Attending: Cardiology | Admitting: Cardiology

## 2024-03-26 VITALS — BP 126/60 | HR 49 | Ht 69.0 in | Wt 263.0 lb

## 2024-03-26 DIAGNOSIS — I10 Essential (primary) hypertension: Secondary | ICD-10-CM | POA: Diagnosis not present

## 2024-03-26 DIAGNOSIS — I480 Paroxysmal atrial fibrillation: Secondary | ICD-10-CM | POA: Diagnosis not present

## 2024-03-26 DIAGNOSIS — E785 Hyperlipidemia, unspecified: Secondary | ICD-10-CM | POA: Diagnosis not present

## 2024-03-26 DIAGNOSIS — N1832 Chronic kidney disease, stage 3b: Secondary | ICD-10-CM

## 2024-03-26 DIAGNOSIS — I428 Other cardiomyopathies: Secondary | ICD-10-CM

## 2024-03-26 DIAGNOSIS — I447 Left bundle-branch block, unspecified: Secondary | ICD-10-CM | POA: Diagnosis not present

## 2024-03-26 MED ORDER — METOPROLOL SUCCINATE ER 25 MG PO TB24
25.0000 mg | ORAL_TABLET | Freq: Every day | ORAL | 3 refills | Status: AC
  Filled 2024-03-26: qty 90, 90d supply, fill #0
  Filled 2024-05-31 – 2024-06-05 (×2): qty 30, 30d supply, fill #1
  Filled 2024-07-10: qty 30, 30d supply, fill #2

## 2024-03-26 NOTE — Patient Instructions (Signed)
 Medication Instructions:  DECREASE METOPROLOL  SUCCINATE TO 25 MG DAILY *If you need a refill on your cardiac medications before your next appointment, please call your pharmacy*  Lab Work: NO LABS If you have labs (blood work) drawn today and your tests are completely normal, you will receive your results only by: MyChart Message (if you have MyChart) OR A paper copy in the mail If you have any lab test that is abnormal or we need to change your treatment, we will call you to review the results.  Testing/Procedures: NO TESTING  Follow-Up: At [facility], you and your health needs are our priority.  As part of our continuing mission to provide you with exceptional heart care, our providers are all part of one team.  This team includes your primary Cardiologist (physician) and Advanced Practice Providers or APPs (Physician Assistants and Nurse Practitioners) who all work together to provide you with the care you need, when you need it.  Your next appointment:   6 month(s)  Provider:   Oneil Parchment, MD

## 2024-03-26 NOTE — Progress Notes (Signed)
 Cardiology Office Note:   Date:  03/26/2024  ID:  [name] [name], DOB 06-30-1946, MRN [id number] PCP: Seabron Lenis, MD  [facility] Providers Cardiologist:  Oneil Parchment, MD    History of Present Illness:   Discussed the use of AI scribe software for clinical note transcription with the patient, who gave verbal consent to proceed.  History of Present Illness [name] [name] is a 78 year old male with history of moderate non-obstructive coronary artery disease, prior TIA, LBBB, hyperlipidemia, and atrial fibrillation who presents for follow-up.  He has a history of coronary artery disease with mild to moderate nonobstructive disease, including a 50% distal left circumflex stenosis identified during a 2019 left heart catheterization. At last visit, his atorvastatin  was increased to 20 mg and Zetia  10mg  continued. His last cholesterol check in June showed a total cholesterol of 164 and LDL of 87, consistent with previous results. He has hyperlipidemia with elevated lipoprotein A, previously measured at 195 but last year was not able to financially managed PCSK9.  He has non-ischemic cardiomyopathy and a history of paroxysmal atrial fibrillation, for which he is on Xarelto . No recent palpitations or awareness of his Afib. His AFib was initially identified on an EKG without him experiencing symptoms. He is on 15 mg of Xarelto  once daily and reports feeling colder, which he associates with the medication.  He has a left bundle branch block, noted during his initial AFib diagnosis. He is currently on 50 mg of metoprolol , which has been stable dose for a while. No dizziness or lightheadedness despite a heart rate of 49 today.  He has chronic kidney disease, stage 3B, and was informed of incidental cysts noted on February CT. He reports no symptoms and is under monitoring.   He takes 25 mg of hydrochlorothiazide , 300 mg of irbesartan , and 10 mg of amlodipine  for blood pressure  management.   Today patient denies chest pain, shortness of breath, lower extremity edema, fatigue, palpitations, melena, hematuria, hemoptysis, diaphoresis, weakness, presyncope, syncope, orthopnea, and PND.   Studies Reviewed:    EKG:   EKG Interpretation Date/Time:  Monday March 26 2024 16:13:02 EDT Ventricular Rate:  48 PR Interval:  190 QRS Duration:  168 QT Interval:  484 QTC Calculation: 432 R Axis:   -56  Text Interpretation: Sinus bradycardia Left axis deviation Left bundle branch block When compared with ECG of 15-Apr-2023 15:43, QRS duration has increased T wave inversion no longer evident in Inferior leads Confirmed by Trudy Birmingham [phone]) on 03/26/2024 4:15:43 PM      Risk Assessment/Calculations:    CHA2DS2-VASc Score = 7   This indicates a 11.2% annual risk of stroke. The patient's score is based upon: CHF History: 1 HTN History: 1 Diabetes History: 0 Stroke History: 2 Vascular Disease History: 1 Age Score: 2 Gender Score: 0             Physical Exam:   VS:  BP 126/60   Pulse (!) 49   Ht 5' 9 (1.753 m)   Wt 263 lb (119.3 kg)   SpO2 98%   BMI 38.84 kg/m    Wt Readings from Last 3 Encounters:  03/26/24 263 lb (119.3 kg)  10/18/23 259 lb 0.7 oz (117.5 kg)  07/12/23 259 lb (117.5 kg)     Physical Exam Vitals reviewed.  Constitutional:      Appearance: Normal appearance.  HENT:     Head: Normocephalic.  Eyes:     Pupils: Pupils are equal, round,  and reactive to light.  Cardiovascular:     Rate and Rhythm: Regular rhythm. Bradycardia present.     Pulses: Normal pulses.     Heart sounds: Normal heart sounds.  Pulmonary:     Effort: Pulmonary effort is normal.     Breath sounds: Normal breath sounds.  Abdominal:     General: Abdomen is flat.     Palpations: Abdomen is soft.  Musculoskeletal:     Right lower leg: No edema.     Left lower leg: No edema.  Skin:    General: Skin is warm and dry.     Capillary Refill: Capillary refill  takes less than 2 seconds.  Neurological:     General: No focal deficit present.     Mental Status: He is alert and oriented to person, place, and time.  Psychiatric:        Mood and Affect: Mood normal.        Behavior: Behavior normal.        Thought Content: Thought content normal.        Judgment: Judgment normal.     ASSESSMENT AND PLAN:    Assessment & Plan Paroxysmal atrial fibrillation Regular rhythm today, sinus bradycardia. Has been asymptomatic with no palpitations or skipped beats. Heart rate is low at 49 bpm without dizziness or lightheadedness. Left bundle branch block noted, chronic. - Continue Xarelto  15 mg daily. - Decrease Metoprolol  Succinate to 25mg  daily given bradycardia on 50mg . Will need to continue monitoring with LBBB.  HFrecEF Patient previously with LVEF low normal in 2019. Found with non-obstructive CAD. EF now normal per 2024 TTE, WMA consistent with chronic LBBB. - Euvolemic today.  Nonobstructive coronary artery disease Nonobstructive coronary artery disease with 50% distal left circumflex stenosis noted in 2019. No recent ischemic symptoms. Emphasis on preventing progression to obstructive disease. - Continue current statin therapy with atorvastatin  20 mg and Zetia . - Explore eligibility for Repatha financial assistance through pharmacy team to address elevated lipoprotein A and LDL levels.  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 LDL remains above target at 87 despite atorvastatin  and Zetia . Repatha or inclisiran are potential options to lower lipoprotein A. - Reach out to pharmacy team to assess eligibility for Repatha financial assistance or similar therapies to lower lipoprotein A and LDL. - Continue atorvastatin  20 mg and Zetia .  Hypertension Hypertension is well-controlled with blood pressure at 126/60, within target range. - Continue current antihypertensive regimen including  hydrochlorothiazide , irbesartan , and amlodipine . - Encourage weight loss to aid in blood pressure management.  Chronic kidney disease, stage 3B Chronic kidney disease, stage 3B, with creatinine levels ranging from 1.9 to 2.2 over the past two years. Nephrologist is monitoring benign renal cortical cysts.  - Continue current medications including hydrochlorothiazide  25 mg, irbesartan  300 mg, and amlodipine  10 mg. - Monitor kidney function with periodic labs as per nephrologist's guidance. Could consider SGLT2 for renal protection, defer to nephrology.           Signed, Artist Pouch, PA-C

## 2024-03-28 ENCOUNTER — Other Ambulatory Visit (HOSPITAL_COMMUNITY): Payer: Self-pay

## 2024-03-28 ENCOUNTER — Telehealth: Payer: Self-pay | Admitting: Pharmacy Technician

## 2024-03-28 NOTE — Telephone Encounter (Signed)
 Patient Advocate Encounter   The patient was approved for a [employer] grant that will help cover the cost of repatha Total amount awarded, 2500.00.  Effective: 02/27/24 - 02/25/25   APW:[id number] ERW:[id number] Hmnle:[id number] PI:[id number]  [employer] ID: [id number]   Pharmacy provided with approval and processing information. Patient informed via mychart

## 2024-03-29 DIAGNOSIS — I428 Other cardiomyopathies: Secondary | ICD-10-CM | POA: Diagnosis not present

## 2024-03-29 DIAGNOSIS — I129 Hypertensive chronic kidney disease with stage 1 through stage 4 chronic kidney disease, or unspecified chronic kidney disease: Secondary | ICD-10-CM | POA: Diagnosis not present

## 2024-03-29 DIAGNOSIS — N1832 Chronic kidney disease, stage 3b: Secondary | ICD-10-CM | POA: Diagnosis not present

## 2024-04-26 ENCOUNTER — Other Ambulatory Visit: Payer: Self-pay

## 2024-04-26 ENCOUNTER — Observation Stay (HOSPITAL_COMMUNITY)
Admission: EM | Admit: 2024-04-26 | Discharge: 2024-04-27 | Disposition: A | Discharge status: Home / Self Care | Attending: Internal Medicine | Admitting: Internal Medicine

## 2024-04-26 ENCOUNTER — Encounter (HOSPITAL_COMMUNITY): Payer: Self-pay | Admitting: Internal Medicine

## 2024-04-26 ENCOUNTER — Emergency Department (HOSPITAL_COMMUNITY)

## 2024-04-26 DIAGNOSIS — Z6836 Body mass index (BMI) 36.0-36.9, adult: Secondary | ICD-10-CM | POA: Diagnosis not present

## 2024-04-26 DIAGNOSIS — R55 Syncope and collapse: Principal | ICD-10-CM | POA: Insufficient documentation

## 2024-04-26 DIAGNOSIS — I447 Left bundle-branch block, unspecified: Secondary | ICD-10-CM | POA: Insufficient documentation

## 2024-04-26 DIAGNOSIS — Z79899 Other long term (current) drug therapy: Secondary | ICD-10-CM | POA: Diagnosis not present

## 2024-04-26 DIAGNOSIS — D649 Anemia, unspecified: Secondary | ICD-10-CM | POA: Insufficient documentation

## 2024-04-26 DIAGNOSIS — I2510 Atherosclerotic heart disease of native coronary artery without angina pectoris: Secondary | ICD-10-CM | POA: Insufficient documentation

## 2024-04-26 DIAGNOSIS — N184 Chronic kidney disease, stage 4 (severe): Secondary | ICD-10-CM | POA: Diagnosis not present

## 2024-04-26 DIAGNOSIS — D631 Anemia in chronic kidney disease: Secondary | ICD-10-CM | POA: Diagnosis not present

## 2024-04-26 DIAGNOSIS — E66811 Obesity, class 1: Secondary | ICD-10-CM | POA: Insufficient documentation

## 2024-04-26 DIAGNOSIS — I517 Cardiomegaly: Secondary | ICD-10-CM | POA: Diagnosis not present

## 2024-04-26 DIAGNOSIS — N183 Chronic kidney disease, stage 3 unspecified: Secondary | ICD-10-CM | POA: Diagnosis present

## 2024-04-26 DIAGNOSIS — I4891 Unspecified atrial fibrillation: Secondary | ICD-10-CM | POA: Diagnosis present

## 2024-04-26 DIAGNOSIS — I129 Hypertensive chronic kidney disease with stage 1 through stage 4 chronic kidney disease, or unspecified chronic kidney disease: Secondary | ICD-10-CM | POA: Diagnosis not present

## 2024-04-26 DIAGNOSIS — Z8739 Personal history of other diseases of the musculoskeletal system and connective tissue: Secondary | ICD-10-CM | POA: Insufficient documentation

## 2024-04-26 DIAGNOSIS — Z8673 Personal history of transient ischemic attack (TIA), and cerebral infarction without residual deficits: Secondary | ICD-10-CM | POA: Insufficient documentation

## 2024-04-26 DIAGNOSIS — I10 Essential (primary) hypertension: Secondary | ICD-10-CM

## 2024-04-26 DIAGNOSIS — I480 Paroxysmal atrial fibrillation: Secondary | ICD-10-CM | POA: Diagnosis not present

## 2024-04-26 DIAGNOSIS — I429 Cardiomyopathy, unspecified: Secondary | ICD-10-CM | POA: Insufficient documentation

## 2024-04-26 DIAGNOSIS — N1832 Chronic kidney disease, stage 3b: Secondary | ICD-10-CM | POA: Diagnosis present

## 2024-04-26 DIAGNOSIS — N189 Chronic kidney disease, unspecified: Secondary | ICD-10-CM | POA: Diagnosis not present

## 2024-04-26 LAB — CBC WITH DIFFERENTIAL/PLATELET
Abs Immature Granulocytes: 0.02 K/uL (ref 0.00–0.07)
Basophils Absolute: 0 K/uL (ref 0.0–0.1)
Basophils Relative: 0 %
Eosinophils Absolute: 0.1 K/uL (ref 0.0–0.5)
Eosinophils Relative: 1 %
HCT: 38.2 % — ABNORMAL LOW (ref 39.0–52.0)
Hemoglobin: 12.4 g/dL — ABNORMAL LOW (ref 13.0–17.0)
Immature Granulocytes: 0 %
Lymphocytes Relative: 20 %
Lymphs Abs: 1.6 K/uL (ref 0.7–4.0)
MCH: 29.1 pg (ref 26.0–34.0)
MCHC: 32.5 g/dL (ref 30.0–36.0)
MCV: 89.7 fL (ref 80.0–100.0)
Monocytes Absolute: 0.7 K/uL (ref 0.1–1.0)
Monocytes Relative: 8 %
Neutro Abs: 5.8 K/uL (ref 1.7–7.7)
Neutrophils Relative %: 71 %
Platelets: 240 K/uL (ref 150–400)
RBC: 4.26 MIL/uL (ref 4.22–5.81)
RDW: 14.4 % (ref 11.5–15.5)
WBC: 8.2 K/uL (ref 4.0–10.5)
nRBC: 0 % (ref 0.0–0.2)

## 2024-04-26 LAB — CBG MONITORING, ED: Glucose-Capillary: 94 mg/dL (ref 70–99)

## 2024-04-26 LAB — COMPREHENSIVE METABOLIC PANEL WITH GFR
ALT: 13 U/L (ref 0–44)
AST: 22 U/L (ref 15–41)
Albumin: 3.4 g/dL — ABNORMAL LOW (ref 3.5–5.0)
Alkaline Phosphatase: 54 U/L (ref 38–126)
Anion gap: 11 (ref 5–15)
BUN: 20 mg/dL (ref 8–23)
CO2: 24 mmol/L (ref 22–32)
Calcium: 9 mg/dL (ref 8.9–10.3)
Chloride: 105 mmol/L (ref 98–111)
Creatinine, Ser: 2.35 mg/dL — ABNORMAL HIGH (ref 0.61–1.24)
GFR, Estimated: 28 mL/min — ABNORMAL LOW (ref >60)
Glucose, Bld: 94 mg/dL (ref 70–99)
Potassium: 4.3 mmol/L (ref 3.5–5.1)
Sodium: 140 mmol/L (ref 135–145)
Total Bilirubin: 0.8 mg/dL (ref 0.0–1.2)
Total Protein: 6.9 g/dL (ref 6.5–8.1)

## 2024-04-26 LAB — URINALYSIS, ROUTINE W REFLEX MICROSCOPIC
Bilirubin Urine: NEGATIVE
Glucose, UA: NEGATIVE mg/dL
Hgb urine dipstick: NEGATIVE
Ketones, ur: NEGATIVE mg/dL
Leukocytes,Ua: NEGATIVE
Nitrite: NEGATIVE
Protein, ur: NEGATIVE mg/dL
Specific Gravity, Urine: 1.013 (ref 1.005–1.030)
pH: 6 (ref 5.0–8.0)

## 2024-04-26 LAB — TROPONIN I (HIGH SENSITIVITY)
Troponin I (High Sensitivity): 9 ng/L (ref <18)
Troponin I (High Sensitivity): 10 ng/L (ref <18)

## 2024-04-26 MED ORDER — HYDROCHLOROTHIAZIDE 25 MG PO TABS
25.0000 mg | ORAL_TABLET | Freq: Every morning | ORAL | Status: DC
  Administered 2024-04-27: 25 mg via ORAL
  Filled 2024-04-26: qty 1

## 2024-04-26 MED ORDER — ATORVASTATIN CALCIUM 10 MG PO TABS: 10.0000 mg | ORAL_TABLET | ORAL | Status: DC

## 2024-04-26 MED ORDER — AMLODIPINE BESYLATE 10 MG PO TABS
10.0000 mg | ORAL_TABLET | Freq: Every morning | ORAL | Status: DC
  Administered 2024-04-27: 10 mg via ORAL
  Filled 2024-04-26: qty 1

## 2024-04-26 MED ORDER — EZETIMIBE 10 MG PO TABS
10.0000 mg | ORAL_TABLET | Freq: Every day | ORAL | Status: DC
  Administered 2024-04-27: 10 mg via ORAL
  Filled 2024-04-26: qty 1

## 2024-04-26 MED ORDER — METOPROLOL SUCCINATE ER 25 MG PO TB24
25.0000 mg | ORAL_TABLET | Freq: Every day | ORAL | Status: DC
  Administered 2024-04-27: 25 mg via ORAL
  Filled 2024-04-26: qty 1

## 2024-04-26 MED ORDER — ACETAMINOPHEN 325 MG PO TABS
650.0000 mg | ORAL_TABLET | Freq: Four times a day (QID) | ORAL | Status: DC | PRN
  Administered 2024-04-27: 650 mg via ORAL
  Filled 2024-04-26: qty 2

## 2024-04-26 MED ORDER — IRBESARTAN 300 MG PO TABS
300.0000 mg | ORAL_TABLET | Freq: Every day | ORAL | Status: DC
  Administered 2024-04-27: 300 mg via ORAL
  Filled 2024-04-26: qty 1

## 2024-04-26 MED ORDER — SODIUM CHLORIDE 0.9 % IV BOLUS
1000.0000 mL | Freq: Once | INTRAVENOUS | Status: AC
  Administered 2024-04-26: 1000 mL via INTRAVENOUS

## 2024-04-26 MED ORDER — ACETAMINOPHEN 650 MG RE SUPP: 650.0000 mg | Freq: Four times a day (QID) | RECTAL | Status: DC | PRN

## 2024-04-26 MED ORDER — GABAPENTIN 300 MG PO CAPS
300.0000 mg | ORAL_CAPSULE | Freq: Three times a day (TID) | ORAL | Status: DC
  Administered 2024-04-26 – 2024-04-27 (×2): 300 mg via ORAL
  Filled 2024-04-26 (×2): qty 1

## 2024-04-26 MED ORDER — TAMSULOSIN HCL 0.4 MG PO CAPS
0.4000 mg | ORAL_CAPSULE | Freq: Every day | ORAL | Status: DC
  Administered 2024-04-27: 0.4 mg via ORAL
  Filled 2024-04-26: qty 1

## 2024-04-26 MED ORDER — ALLOPURINOL 300 MG PO TABS
300.0000 mg | ORAL_TABLET | Freq: Every day | ORAL | Status: DC
  Administered 2024-04-27: 300 mg via ORAL
  Filled 2024-04-26: qty 1

## 2024-04-26 MED ORDER — RIVAROXABAN 15 MG PO TABS
15.0000 mg | ORAL_TABLET | Freq: Every evening | ORAL | Status: DC
  Administered 2024-04-26: 15 mg via ORAL
  Filled 2024-04-26 (×2): qty 1

## 2024-04-26 NOTE — ED Triage Notes (Signed)
 [facility]IB [facility]. Reported having a syncopal episode  Fall, did NOT hit head, on thinners  500 bolus L AC IV  Pressures 102/74 to 126/76 97% spO2 on RA  67bpm  113 cbg Denies N/V  Hx of Afib A&O

## 2024-04-26 NOTE — H&P (Signed)
 History and Physical    [name] [name] FMW:[id number] DOB: [birth year] DOA: 04/26/2024  Patient coming from: Home.  Chief Complaint: Loss of consciousness.  HPI: [name] [name] is a 78 y.o. male with history of paroxysmal atrial fibrillation, chronic LBBB, chronic kidney disease stage III, hypertension, BPH, gout was brought to the ER after patient briefly lost consciousness while getting his car cleaned.  Patient states when he lost consciousness he was in a sitting position.  Did not have any chest pain shortness of breath palpitation prior to the episode or after.  Patient had recently followed up with cardiology office when his metoprolol  dose was decreased due to bradycardia.  ED Course: In the ER EKG shows sinus rhythm with LBBB troponins were negative.  Hemoglobin 12.4 creatinine 2.3.  Patient admitted for further observation.  Patient appears nonfocal.  Review of Systems: As per HPI, rest all negative.   Past Medical History:  Diagnosis Date   Arthritis    CKD (chronic kidney disease), stage III (HCC)    Coronary artery disease    GERD (gastroesophageal reflux disease)    Gout    Heart murmur    History of TIA (transient ischemic attack)    Hypertension    Inguinal hernia    Left   Kidney cysts    Left   LBBB (left bundle branch block)    Non-ischemic cardiomyopathy (HCC)    Paroxysmal atrial fibrillation (HCC)    Sleep apnea 2018   CPAP occasionally   Stroke [facility])    TIA    Past Surgical History:  Procedure Laterality Date   COLONOSCOPY     HERNIA REPAIR Right    Inguinal Hernia   NASAL TURBINATE REDUCTION Bilateral 03/30/2023   Procedure: TURBINATE REDUCTION/SUBMUCOSAL RESECTION;  Surgeon: Carlie Clark, MD;  Location: [facility] OR;  Service: ENT;  Laterality: Bilateral;   RIGHT/LEFT HEART CATH AND CORONARY ANGIOGRAPHY N/A 02/10/2018   Procedure: RIGHT/LEFT HEART CATH AND CORONARY ANGIOGRAPHY;  Surgeon: Mady Bruckner, MD;  Location: [facility] INVASIVE CV LAB;   Service: Cardiovascular;  Laterality: N/A;   SINUS ENDO WITH FUSION Bilateral 03/30/2023   Procedure: BILATERAL ANTERIOR ETHMOIDECTOMY, BILATERAL MAXILLARY ANTROSTOMY, FUSION IMAGE GUIDANCE;  Surgeon: Carlie Clark, MD;  Location: [facility] OR;  Service: ENT;  Laterality: Bilateral;   XI ROBOTIC ASSISTED INGUINAL HERNIA REPAIR WITH MESH Left 01/12/2022   Procedure: XI ROBOTIC ASSISTED LEFT INGUINAL HERNIA REPAIR WITH MESH;  Surgeon: Kinsinger, Herlene Righter, MD;  Location: [facility] ORS;  Service: General;  Laterality: Left;     reports that he has never smoked. He has never used smokeless tobacco. He reports that he does not drink alcohol and does not use drugs.  Allergies  Allergen Reactions   Benazepril     Pt is unaware of allergy    Pravastatin     Pt is unaware of allergy    Prednisone  Nausea Only    Able to tolerate low doses   Valsartan     Pt is unaware of allergy     Family History  Problem Relation Age of Onset   Cancer Mother    Hypertension Mother    Heart disease Father    Hypertension Father    Hypertension Sister    Hypertension Brother     Prior to Admission medications   Medication Sig Start Date End Date Taking? Authorizing Provider  acetaminophen  (TYLENOL ) 325 MG tablet Take 325 mg by mouth every 6 (six) hours as needed for mild pain or fever.  [provider]  allopurinol  (ZYLOPRIM ) 300 MG tablet Take 300 mg by mouth daily. Patient not taking: Reported on 03/26/2024    [provider]  allopurinol  (ZYLOPRIM ) 300 MG tablet Take 1 tablet (300 mg total) by mouth daily. Patient not taking: Reported on 03/26/2024 10/17/23     allopurinol  (ZYLOPRIM ) 300 MG tablet Take 1 tablet (300 mg total) by mouth daily. 02/10/24     amLODipine  (NORVASC ) 10 MG tablet Take 10 mg by mouth daily. Patient not taking: Reported on 03/26/2024 09/13/14   [provider]  amLODipine  (NORVASC ) 10 MG tablet Take 1 tablet (10 mg total) by mouth in the morning. Patient not taking:  Reported on 03/26/2024 09/30/23     amLODipine  (NORVASC ) 10 MG tablet Take 1 tablet (10 mg total) by mouth in the morning. 02/10/24     amoxicillin  (AMOXIL ) 500 MG capsule Take 1 capsule (500 mg total) by mouth 3 (three) times daily for 7 days 02/20/24     atorvastatin  (LIPITOR) 10 MG tablet Take 2 tablets (20 mg total) by mouth once a week. Patient not taking: Reported on 03/26/2024 04/26/23   Swinyer, Rosaline HERO, NP  atorvastatin  (LIPITOR) 10 MG tablet Take 1 tablet (10 mg total) by mouth once a week. Patient not taking: Reported on 03/26/2024 01/16/24     atorvastatin  (LIPITOR) 10 MG tablet Take 1 tablet (10 mg total) by mouth once a week. 02/10/24     BISACODYL 5 MG EC tablet Take 20 mg by mouth as directed. 06/02/23   [provider]  cetirizine  (ZYRTEC  ALLERGY) 10 MG tablet Take 1 tablet (10 mg total) by mouth daily. 08/02/20   Christopher Savannah, PA-C  colchicine  0.6 MG tablet Take 0.6 mg by mouth daily as needed (for gout).  Patient not taking: Reported on 03/26/2024    [provider]  colchicine  0.6 MG tablet Take 2 tablets (1.2 mg total) by mouth immediately at onset of gout attack. May repeat dose in 1 hour if needed, then 2 tabs once a day as needed. 08/18/23     dextromethorphan-guaiFENesin  (MUCINEX  DM) 30-600 MG 12hr tablet Take 1 tablet by mouth 2 (two) times daily. 10/22/22   Lynwood Lenis, PA-C  diclofenac  Sodium (VOLTAREN ) 1 % GEL Apply 4 g topically 4 (four) times daily. Patient not taking: Reported on 03/26/2024 10/19/23   Kommor, Lum, MD  diclofenac  Sodium (VOLTAREN ) 1 % GEL Apply topically twice daily as directed 09/26/23     ezetimibe  (ZETIA ) 10 MG tablet Take 10 mg by mouth daily. Patient not taking: Reported on 03/26/2024 12/07/21   [provider]  ezetimibe  (ZETIA ) 10 MG tablet Take 1 tablet (10 mg total) by mouth daily. Patient not taking: Reported on 03/26/2024 10/17/23     ezetimibe  (ZETIA ) 10 MG tablet Take 1 tablet (10 mg total) by mouth daily. 02/10/24      fluticasone (FLONASE) 50 MCG/ACT nasal spray Place 1 spray into both nostrils at bedtime.    [provider]  gabapentin  (NEURONTIN ) 300 MG capsule Take 300 mg by mouth 3 (three) times daily. Patient not taking: Reported on 03/26/2024    [provider]  gabapentin  (NEURONTIN ) 300 MG capsule Take 1 capsule (300 mg total) by mouth 3 (three) times daily. 02/10/24     hydrochlorothiazide  (HYDRODIURIL ) 25 MG tablet Take 25 mg by mouth daily. Patient not taking: Reported on 03/26/2024 12/30/17   [provider]  hydrochlorothiazide  (HYDRODIURIL ) 25 MG tablet Take 1 tablet (25 mg total) by mouth every  morning. Patient not taking: Reported on 03/26/2024 09/16/23     hydrochlorothiazide  (HYDRODIURIL ) 25 MG tablet Take 1 tablet (25 mg total) by mouth in the morning. 02/10/24     hydrocortisone  (ANUSOL -HC) 2.5 % rectal cream Apply 1 Application topically 2 (two) times daily. Patient not taking: Reported on 03/26/2024 05/18/23   [provider]  hydrocortisone  (ANUSOL -HC) 25 MG suppository Place 1 suppository (25 mg total) rectally 2 (two) times daily. Patient not taking: Reported on 03/26/2024 05/07/23   Henderly, Britni A, PA-C  hydrocortisone  2.5 % cream Apply 1 application. topically 2 (two) times daily as needed (irritation). Patient not taking: Reported on 03/26/2024    [provider]  ipratropium (ATROVENT) 0.03 % nasal spray Place 2 sprays into both nostrils at bedtime as needed for rhinitis. 02/15/23   [provider]  irbesartan  (AVAPRO ) 300 MG tablet Take 300 mg by mouth every morning. Patient not taking: Reported on 03/26/2024 09/01/18   [provider]  irbesartan  (AVAPRO ) 300 MG tablet Take 1 tablet (300 mg total) by mouth daily. Patient not taking: Reported on 03/26/2024 09/30/23     irbesartan  (AVAPRO ) 300 MG tablet Take 1 tablet (300 mg total) by mouth daily. 02/10/24     lidocaine  (LIDODERM ) 5 % Place 1 patch onto the skin daily. Remove &  Discard patch within 12 hours or as directed by MD 10/19/23   Kommor, Lum, MD  linaclotide [facility]) 72 MCG capsule Take 72 mcg by mouth daily as needed (Constipation).    [provider]  methylPREDNISolone  (MEDROL  DOSEPAK) 4 MG TBPK tablet Take as prescribed 10/19/23   Kommor, Madison, MD  metoprolol  succinate (TOPROL  XL) 25 MG 24 hr tablet Take 1 tablet (25 mg total) by mouth daily. 03/26/24   Williams, Evan, PA-C  Multiple Vitamin (MULTIVITAMIN WITH MINERALS) TABS tablet Take 1 tablet by mouth daily.    [provider]  omeprazole  (PRILOSEC) 40 MG capsule Take 40 mg by mouth daily.    [provider]  omeprazole  (PRILOSEC) 40 MG capsule Take 1 capsule (40 mg total) by mouth daily. Patient not taking: Reported on 03/26/2024 01/16/24     omeprazole  (PRILOSEC) 40 MG capsule Take 1 capsule (40 mg total) by mouth daily. 02/10/24     ondansetron  (ZOFRAN -ODT) 4 MG disintegrating tablet Take 1 tablet (4 mg total) by mouth every 8 (eight) hours as needed for nausea or vomiting. 10/19/23   Kommor, Madison, MD  oxyCODONE  (ROXICODONE ) 5 MG immediate release tablet Take 1 tablet (5 mg total) by mouth every 6 (six) hours as needed for breakthrough pain. 10/19/23   Kommor, Madison, MD  polyethylene glycol (MIRALAX  / GLYCOLAX ) 17 g packet Take 17 g by mouth daily as needed for moderate constipation. Patient not taking: Reported on 03/26/2024    [provider]  polyethylene glycol-electrolytes (NULYTELY ) 420 g solution Take 4,000 mLs by mouth See admin instructions. Patient not taking: Reported on 03/26/2024 06/02/23   [provider]  promethazine  (PHENERGAN ) 25 MG tablet Take 25 mg by mouth every 6 (six) hours as needed for nausea or vomiting. Patient not taking: Reported on 03/26/2024    [provider]  promethazine  (PHENERGAN ) 25 MG tablet Take 1 tablet (25 mg total) by mouth every 12 (twelve) hours for 5 days. Patient not taking: Reported on 03/26/2024 01/16/24      promethazine -dextromethorphan (PROMETHAZINE -DM) 6.25-15 MG/5ML syrup Take 5 mLs by mouth 4 (four) times daily as needed for cough. 08/29/23   Billy Asberry FALCON, PA-C  Rivaroxaban  (XARELTO ) 15 MG TABS tablet Take 1 tablet (15 mg total) by mouth daily with supper. Patient not taking: Reported on 03/26/2024 05/05/23   Jeffrie Oneil BROCKS, MD  Rivaroxaban  (XARELTO ) 15 MG TABS tablet Take 1 tablet (15 mg total) by mouth every evening with supper 02/14/24   Jeffrie Oneil BROCKS, MD  tadalafil (CIALIS) 5 MG tablet Take 5 mg by mouth daily as needed. 06/16/23   [provider]  tamsulosin  (FLOMAX ) 0.4 MG CAPS capsule Take 0.4 mg by mouth daily. Patient not taking: Reported on 03/26/2024    [provider]  tamsulosin  (FLOMAX ) 0.4 MG CAPS capsule Take 1 capsule (0.4 mg total) by mouth daily. Patient not taking: Reported on 03/26/2024 01/16/24     tamsulosin  (FLOMAX ) 0.4 MG CAPS capsule Take 1 capsule (0.4 mg total) by mouth daily. 02/10/24     traMADol (ULTRAM) 50 MG tablet Take 50 mg by mouth every 6 (six) hours as needed for severe pain.    [provider]  witch hazel-glycerin  (TUCKS) pad Apply 1 Application topically as needed for itching. 05/07/23   Henderly, Britni A, PA-C    Physical Exam: Constitutional: Moderately built and nourished. Vitals:   04/26/24 1631 04/26/24 1636 04/26/24 2015 04/26/24 2059  BP: 122/69  136/77 (!) 150/71  Pulse: 70 64 65 70  Resp: (!) 22 16 15 20   Temp: 97.6 F (36.4 C) 98.1 F (36.7 C)  98.2 F (36.8 C)  TempSrc:  Oral    SpO2: 100% 100% 100% 98%  Weight:  113.4 kg    Height:  5' 9 (1.753 m)     Eyes: Anicteric no pallor. ENMT: No discharge from the ears eyes nose or mouth. Neck: No mass felt.  No neck rigidity. Respiratory: No rhonchi or crepitations. Cardiovascular: S1-S2 heard. Abdomen: Soft nontender bowel sounds present. Musculoskeletal: No edema. Skin: No rash. Neurologic: Alert awake oriented to time place and person.  Moves all  extremities. Psychiatric: Appears normal.  Normal affect.   Labs on Admission: I have personally reviewed following labs and imaging studies  CBC: Recent Labs  Lab 04/26/24 1823  WBC 8.2  NEUTROABS 5.8  HGB 12.4*  HCT 38.2*  MCV 89.7  PLT 240   Basic Metabolic Panel: Recent Labs  Lab 04/26/24 1823  NA 140  K 4.3  CL 105  CO2 24  GLUCOSE 94  BUN 20  CREATININE 2.35*  CALCIUM  9.0   GFR: Estimated Creatinine Clearance: 33.2 mL/min (A) (by C-G formula based on SCr of 2.35 mg/dL (H)). Liver Function Tests: Recent Labs  Lab 04/26/24 1823  AST 22  ALT 13  ALKPHOS 54  BILITOT 0.8  PROT 6.9  ALBUMIN 3.4*   No results for input(s): LIPASE, AMYLASE in the last 168 hours. No results for input(s): AMMONIA in the last 168 hours. Coagulation Profile: No results for input(s): INR, PROTIME in the last 168 hours. Cardiac Enzymes: No results for input(s): CKTOTAL, CKMB, CKMBINDEX, TROPONINI in the last 168 hours. BNP (last 3 results) No results for input(s): PROBNP in the last 8760 hours. HbA1C: No results for input(s): HGBA1C in the last 72 hours. CBG: Recent Labs  Lab 04/26/24 1629  GLUCAP 94   Lipid Profile: No results for input(s): CHOL, HDL, LDLCALC, TRIG, CHOLHDL, LDLDIRECT in the last 72 hours. Thyroid Function Tests: No results for input(s): TSH, T4TOTAL, FREET4, T3FREE, THYROIDAB in the last 72 hours. Anemia Panel: No results for input(s): VITAMINB12, FOLATE, FERRITIN, TIBC, IRON, RETICCTPCT in the last 72  hours. Urine analysis:    Component Value Date/Time   COLORURINE YELLOW 04/26/2024 1918   APPEARANCEUR CLEAR 04/26/2024 1918   LABSPEC 1.013 04/26/2024 1918   PHURINE 6.0 04/26/2024 1918   GLUCOSEU NEGATIVE 04/26/2024 1918   HGBUR NEGATIVE 04/26/2024 1918   BILIRUBINUR NEGATIVE 04/26/2024 1918   BILIRUBINUR negative 04/22/2016 1458   KETONESUR NEGATIVE 04/26/2024 1918   PROTEINUR NEGATIVE  04/26/2024 1918   UROBILINOGEN 0.2 04/22/2016 1458   UROBILINOGEN 0.2 12/27/2014 2315   NITRITE NEGATIVE 04/26/2024 1918   LEUKOCYTESUR NEGATIVE 04/26/2024 1918   Sepsis Labs: @LABRCNTIP (procalcitonin:4,lacticidven:4) )No results found for this or any previous visit (from the past 240 hours).   Radiological Exams on Admission: DG Chest Port 1 View Result Date: 04/26/2024 CLINICAL DATA:  Syncope.  Clemens due to syncopal episode. EXAM: PORTABLE CHEST 1 VIEW COMPARISON:  10/08/2021 FINDINGS: Cardiac enlargement. No vascular congestion, edema, or consolidation. No pleural effusion or pneumothorax. Mediastinal contours appear intact. Degenerative changes in the spine and shoulders. IMPRESSION: No active disease. Electronically Signed   By: Elsie Gravely M.D.   On: 04/26/2024 16:56    EKG: Independently reviewed.  Normal sinus rhythm LBBB.  Assessment/Plan Principal Problem:   Syncope Active Problems:   Cardiomyopathy (HCC)   Paroxysmal atrial fibrillation (HCC)   Left bundle branch block   Chronic kidney disease, stage III (moderate) (HCC)   Anemia   Essential hypertension   History of gout    Syncope -    with no prodrome.  Continue to monitor telemetry will consult cardiology.  Check 2D echo. Paroxysmal atrial fibrillation presently in sinus rhythm on Eliquis for anticoagulation and beta-blockers for rate control. Hypertension on amlodipine  ARB and HCTZ and beta-blockers. Chronic kidney disease stage III creatinine at around baseline. 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 History of gout on allopurinol . Chronic LBBB.  Since patient has syncope with no prodrome will need close monitoring further workup and more than 2 midnight stay.   DVT prophylaxis: Eliquis. Code Status: Full code. Family Communication: Patient's daughter at the bedside. Disposition Plan: Monitored bed. Consults called: Cardiology. Admission status: Observation.

## 2024-04-26 NOTE — ED Notes (Signed)
 CCMD called.

## 2024-04-26 NOTE — ED Provider Notes (Signed)
 [place] EMERGENCY DEPARTMENT AT [facility] Provider Note   CSN: [id number] Arrival date & time: 04/26/24  1620     Patient presents with: Loss of Consciousness   [name] [name] is a 78 y.o. male.   Pt is a 78 yo male with pmhx significant for HTN, sleep apnea, arthritis, afib (on Xarelto ), CAD, CKD, CVA, and nonischemic CM.  Pt [place] the car wash and pt was inside the building sitting on a bench.  He had a syncopal event that was witnessed by the car Secretary/administrator.  He denies hitting his head or hurting himself.  He did eat breakfast (Cheerios), but has not had lunch.  He had a bp of 102 upon EMS arrival.  He was given 500 cc NS bolus en route.  Pt feels well now.  He denies any prodromal sx.    I spoke with the man he was talking to.  He said the patient was sitting on a bench talking to him and all of the sudden, he passed out.  He said there was no pause, pt did not say he felt bad, he just passed out.       Prior to Admission medications   Medication Sig Start Date End Date Taking? Authorizing Provider  acetaminophen  (TYLENOL ) 325 MG tablet Take 325 mg by mouth every 6 (six) hours as needed for mild pain or fever.    [provider]  allopurinol  (ZYLOPRIM ) 300 MG tablet Take 300 mg by mouth daily. Patient not taking: Reported on 03/26/2024    [provider]  allopurinol  (ZYLOPRIM ) 300 MG tablet Take 1 tablet (300 mg total) by mouth daily. Patient not taking: Reported on 03/26/2024 10/17/23     allopurinol  (ZYLOPRIM ) 300 MG tablet Take 1 tablet (300 mg total) by mouth daily. 02/10/24     amLODipine  (NORVASC ) 10 MG tablet Take 10 mg by mouth daily. Patient not taking: Reported on 03/26/2024 09/13/14   [provider]  amLODipine  (NORVASC ) 10 MG tablet Take 1 tablet (10 mg total) by mouth in the morning. Patient not taking: Reported on 03/26/2024 09/30/23     amLODipine  (NORVASC ) 10 MG tablet Take 1 tablet (10 mg total) by mouth in the  morning. 02/10/24     amoxicillin  (AMOXIL ) 500 MG capsule Take 1 capsule (500 mg total) by mouth 3 (three) times daily for 7 days 02/20/24     atorvastatin  (LIPITOR) 10 MG tablet Take 2 tablets (20 mg total) by mouth once a week. Patient not taking: Reported on 03/26/2024 04/26/23   Swinyer, Rosaline HERO, NP  atorvastatin  (LIPITOR) 10 MG tablet Take 1 tablet (10 mg total) by mouth once a week. Patient not taking: Reported on 03/26/2024 01/16/24     atorvastatin  (LIPITOR) 10 MG tablet Take 1 tablet (10 mg total) by mouth once a week. 02/10/24     BISACODYL 5 MG EC tablet Take 20 mg by mouth as directed. 06/02/23   [provider]  cetirizine  (ZYRTEC  ALLERGY) 10 MG tablet Take 1 tablet (10 mg total) by mouth daily. 08/02/20   Christopher Savannah, PA-C  colchicine  0.6 MG tablet Take 0.6 mg by mouth daily as needed (for gout).  Patient not taking: Reported on 03/26/2024    [provider]  colchicine  0.6 MG tablet Take 2 tablets (1.2 mg total) by mouth immediately at onset of gout attack. May repeat dose in 1 hour if needed, then 2 tabs once a day as needed. 08/18/23  dextromethorphan-guaiFENesin  (MUCINEX  DM) 30-600 MG 12hr tablet Take 1 tablet by mouth 2 (two) times daily. 10/22/22   Lynwood Lenis, PA-C  diclofenac  Sodium (VOLTAREN ) 1 % GEL Apply 4 g topically 4 (four) times daily. Patient not taking: Reported on 03/26/2024 10/19/23   Kommor, Lum, MD  diclofenac  Sodium (VOLTAREN ) 1 % GEL Apply topically twice daily as directed 09/26/23     ezetimibe  (ZETIA ) 10 MG tablet Take 10 mg by mouth daily. Patient not taking: Reported on 03/26/2024 12/07/21   [provider]  ezetimibe  (ZETIA ) 10 MG tablet Take 1 tablet (10 mg total) by mouth daily. Patient not taking: Reported on 03/26/2024 10/17/23     ezetimibe  (ZETIA ) 10 MG tablet Take 1 tablet (10 mg total) by mouth daily. 02/10/24     fluticasone (FLONASE) 50 MCG/ACT nasal spray Place 1 spray into both nostrils at bedtime.    [provider]  gabapentin  (NEURONTIN ) 300 MG capsule Take 300 mg by mouth 3 (three) times daily. Patient not taking: Reported on 03/26/2024    [provider]  gabapentin  (NEURONTIN ) 300 MG capsule Take 1 capsule (300 mg total) by mouth 3 (three) times daily. 02/10/24     hydrochlorothiazide  (HYDRODIURIL ) 25 MG tablet Take 25 mg by mouth daily. Patient not taking: Reported on 03/26/2024 12/30/17   [provider]  hydrochlorothiazide  (HYDRODIURIL ) 25 MG tablet Take 1 tablet (25 mg total) by mouth every morning. Patient not taking: Reported on 03/26/2024 09/16/23     hydrochlorothiazide  (HYDRODIURIL ) 25 MG tablet Take 1 tablet (25 mg total) by mouth in the morning. 02/10/24     hydrocortisone  (ANUSOL -HC) 2.5 % rectal cream Apply 1 Application topically 2 (two) times daily. Patient not taking: Reported on 03/26/2024 05/18/23   [provider]  hydrocortisone  (ANUSOL -HC) 25 MG suppository Place 1 suppository (25 mg total) rectally 2 (two) times daily. Patient not taking: Reported on 03/26/2024 05/07/23   Henderly, Britni A, PA-C  hydrocortisone  2.5 % cream Apply 1 application. topically 2 (two) times daily as needed (irritation). Patient not taking: Reported on 03/26/2024    [provider]  ipratropium (ATROVENT) 0.03 % nasal spray Place 2 sprays into both nostrils at bedtime as needed for rhinitis. 02/15/23   [provider]  irbesartan  (AVAPRO ) 300 MG tablet Take 300 mg by mouth every morning. Patient not taking: Reported on 03/26/2024 09/01/18   [provider]  irbesartan  (AVAPRO ) 300 MG tablet Take 1 tablet (300 mg total) by mouth daily. Patient not taking: Reported on 03/26/2024 09/30/23     irbesartan  (AVAPRO ) 300 MG tablet Take 1 tablet (300 mg total) by mouth daily. 02/10/24     lidocaine  (LIDODERM ) 5 % Place 1 patch onto the skin daily. Remove & Discard patch within 12 hours or as directed by MD 10/19/23   Kommor, Lum, MD  linaclotide [facility]) 72 MCG capsule  Take 72 mcg by mouth daily as needed (Constipation).    [provider]  methylPREDNISolone  (MEDROL  DOSEPAK) 4 MG TBPK tablet Take as prescribed 10/19/23   Kommor, Madison, MD  metoprolol  succinate (TOPROL  XL) 25 MG 24 hr tablet Take 1 tablet (25 mg total) by mouth daily. 03/26/24   Williams, Evan, PA-C  Multiple Vitamin (MULTIVITAMIN WITH MINERALS) TABS tablet Take 1 tablet by mouth daily.    [provider]  omeprazole  (PRILOSEC) 40 MG capsule Take 40 mg by mouth daily.    [provider]  omeprazole  (PRILOSEC) 40 MG capsule Take 1 capsule (40 mg total) by  mouth daily. Patient not taking: Reported on 03/26/2024 01/16/24     omeprazole  (PRILOSEC) 40 MG capsule Take 1 capsule (40 mg total) by mouth daily. 02/10/24     ondansetron  (ZOFRAN -ODT) 4 MG disintegrating tablet Take 1 tablet (4 mg total) by mouth every 8 (eight) hours as needed for nausea or vomiting. 10/19/23   Kommor, Madison, MD  oxyCODONE  (ROXICODONE ) 5 MG immediate release tablet Take 1 tablet (5 mg total) by mouth every 6 (six) hours as needed for breakthrough pain. 10/19/23   Kommor, Madison, MD  polyethylene glycol (MIRALAX  / GLYCOLAX ) 17 g packet Take 17 g by mouth daily as needed for moderate constipation. Patient not taking: Reported on 03/26/2024    [provider]  polyethylene glycol-electrolytes (NULYTELY ) 420 g solution Take 4,000 mLs by mouth See admin instructions. Patient not taking: Reported on 03/26/2024 06/02/23   [provider]  promethazine  (PHENERGAN ) 25 MG tablet Take 25 mg by mouth every 6 (six) hours as needed for nausea or vomiting. Patient not taking: Reported on 03/26/2024    [provider]  promethazine  (PHENERGAN ) 25 MG tablet Take 1 tablet (25 mg total) by mouth every 12 (twelve) hours for 5 days. Patient not taking: Reported on 03/26/2024 01/16/24     promethazine -dextromethorphan (PROMETHAZINE -DM) 6.25-15 MG/5ML syrup Take 5 mLs by mouth 4 (four) times daily as  needed for cough. 08/29/23   Billy Asberry FALCON, PA-C  Rivaroxaban  (XARELTO ) 15 MG TABS tablet Take 1 tablet (15 mg total) by mouth daily with supper. Patient not taking: Reported on 03/26/2024 05/05/23   Jeffrie Oneil BROCKS, MD  Rivaroxaban  (XARELTO ) 15 MG TABS tablet Take 1 tablet (15 mg total) by mouth every evening with supper 02/14/24   Jeffrie Oneil BROCKS, MD  tadalafil (CIALIS) 5 MG tablet Take 5 mg by mouth daily as needed. 06/16/23   [provider]  tamsulosin  (FLOMAX ) 0.4 MG CAPS capsule Take 0.4 mg by mouth daily. Patient not taking: Reported on 03/26/2024    [provider]  tamsulosin  (FLOMAX ) 0.4 MG CAPS capsule Take 1 capsule (0.4 mg total) by mouth daily. Patient not taking: Reported on 03/26/2024 01/16/24     tamsulosin  (FLOMAX ) 0.4 MG CAPS capsule Take 1 capsule (0.4 mg total) by mouth daily. 02/10/24     traMADol (ULTRAM) 50 MG tablet Take 50 mg by mouth every 6 (six) hours as needed for severe pain.    [provider]  witch hazel-glycerin  (TUCKS) pad Apply 1 Application topically as needed for itching. 05/07/23   Henderly, Britni A, PA-C    Allergies: Benazepril, Pravastatin, Prednisone , and Valsartan    Review of Systems  Neurological:  Positive for syncope.  All other systems reviewed and are negative.   Updated Vital Signs BP 122/69   Pulse 64   Temp 98.1 F (36.7 C) (Oral)   Resp 16   Ht 5' 9 (1.753 m)   Wt 113.4 kg   SpO2 100%   BMI 36.92 kg/m   Physical Exam Vitals and nursing note reviewed.  Constitutional:      Appearance: Normal appearance.  HENT:     Head: Normocephalic and atraumatic.     Right Ear: External ear normal.     Left Ear: External ear normal.     Nose: Nose normal.     Mouth/Throat:     Mouth: Mucous membranes are moist.     Pharynx: Oropharynx is clear.  Eyes:     Extraocular Movements: Extraocular movements intact.  Conjunctiva/sclera: Conjunctivae normal.     Pupils: Pupils are equal, round, and reactive to  light.  Cardiovascular:     Rate and Rhythm: Normal rate and regular rhythm.     Pulses: Normal pulses.     Heart sounds: Normal heart sounds.  Pulmonary:     Effort: Pulmonary effort is normal.     Breath sounds: Normal breath sounds.  Abdominal:     General: Abdomen is flat. Bowel sounds are normal.     Palpations: Abdomen is soft.  Musculoskeletal:        General: Normal range of motion.     Cervical back: Normal range of motion and neck supple.  Skin:    General: Skin is warm.     Capillary Refill: Capillary refill takes less than 2 seconds.  Neurological:     General: No focal deficit present.     Mental Status: He is alert and oriented to person, place, and time.  Psychiatric:        Mood and Affect: Mood normal.        Behavior: Behavior normal.     (all labs ordered are listed, but only abnormal results are displayed) Labs Reviewed  CBC WITH DIFFERENTIAL/PLATELET - Abnormal; Notable for the following components:      Result Value   Hemoglobin 12.4 (*)    HCT 38.2 (*)    All other components within normal limits  COMPREHENSIVE METABOLIC PANEL WITH GFR - Abnormal; Notable for the following components:   Creatinine, Ser 2.35 (*)    Albumin 3.4 (*)    GFR, Estimated 28 (*)    All other components within normal limits  URINALYSIS, ROUTINE W REFLEX MICROSCOPIC  CBG MONITORING, ED  TROPONIN I (HIGH SENSITIVITY)  TROPONIN I (HIGH SENSITIVITY)    EKG: EKG Interpretation Date/Time:  Thursday April 26 2024 16:28:42 EDT Ventricular Rate:  66 PR Interval:  170 QRS Duration:  160 QT Interval:  454 QTC Calculation: 476 R Axis:   -44  Text Interpretation: Sinus rhythm Nonspecific IVCD with LAD Consider anterolateral infarct No significant change since last tracing Confirmed by Dean Clarity [phone]) on 04/26/2024 4:35:23 PM  Radiology: [facility] Chest Port 1 View Result Date: 04/26/2024 CLINICAL DATA:  Syncope.  Clemens due to syncopal episode. EXAM: PORTABLE CHEST 1 VIEW  COMPARISON:  10/08/2021 FINDINGS: Cardiac enlargement. No vascular congestion, edema, or consolidation. No pleural effusion or pneumothorax. Mediastinal contours appear intact. Degenerative changes in the spine and shoulders. IMPRESSION: No active disease. Electronically Signed   By: Elsie Gravely M.D.   On: 04/26/2024 16:56     Procedures   Medications Ordered in the ED  sodium chloride  0.9 % bolus 1,000 mL (1,000 mLs Intravenous New Bag/Given 04/26/24 1824)                                    Medical Decision Making Amount and/or Complexity of Data Reviewed Labs: ordered. Radiology: ordered.  Risk Decision regarding hospitalization.   This patient presents to the ED for concern of syncope, this involves an extensive number of treatment options, and is a complaint that carries with it a high risk of complications and morbidity.  The differential diagnosis includes orthostatic, cardiogenic, vasovagal   Co morbidities that complicate the patient evaluation  HTN, sleep apnea, arthritis, afib (on Xarelto ), CAD, CKD, CVA, and nonischemic CM   Additional history obtained:  Additional history obtained from epic chart  review External records from outside source obtained and reviewed including EMS report   Lab Tests:  I Ordered, and personally interpreted labs.  The pertinent results include:  cbc with hgb 12.4 (stable); cr elevated at 2.35 (stable); trop nl   Imaging Studies ordered:  I ordered imaging studies including cxr  I independently visualized and interpreted imaging which showed No active disease.  I agree with the radiologist interpretation   Cardiac Monitoring:  The patient was maintained on a cardiac monitor.  I personally viewed and interpreted the cardiac monitored which showed an underlying rhythm of: nsr   Medicines ordered and prescription drug management:  I ordered medication including ivfs  for sx  Reevaluation of the patient after these medicines  showed that the patient improved I have reviewed the patients home medicines and have made adjustments as needed   Test Considered:  ct   Critical Interventions:  ivfs   Consultations Obtained:  I requested consultation with the hospitalist (Dr. Franky),  and discussed lab and imaging findings as well as pertinent plan -he will admit   Problem List / ED Course:  Syncope:  high risk syncopal event.  He will need adm for syncope.   Reevaluation:  After the interventions noted above, I reevaluated the patient and found that they have :improved   Social Determinants of Health:  Lives at home   Dispostion:  After consideration of the diagnostic results and the patients response to treatment, I feel that the patent would benefit from admission.       Final diagnoses:  Syncope, unspecified syncope type  Stage 4 chronic kidney disease [facility])    ED Discharge Orders     None          Dean Clarity, MD 04/26/24 [phone]

## 2024-04-27 ENCOUNTER — Observation Stay (HOSPITAL_COMMUNITY)

## 2024-04-27 ENCOUNTER — Other Ambulatory Visit: Payer: Self-pay

## 2024-04-27 DIAGNOSIS — R001 Bradycardia, unspecified: Secondary | ICD-10-CM

## 2024-04-27 DIAGNOSIS — I509 Heart failure, unspecified: Secondary | ICD-10-CM | POA: Diagnosis not present

## 2024-04-27 DIAGNOSIS — R55 Syncope and collapse: Secondary | ICD-10-CM | POA: Diagnosis not present

## 2024-04-27 DIAGNOSIS — I447 Left bundle-branch block, unspecified: Secondary | ICD-10-CM

## 2024-04-27 DIAGNOSIS — I10 Essential (primary) hypertension: Secondary | ICD-10-CM | POA: Diagnosis not present

## 2024-04-27 DIAGNOSIS — I5030 Unspecified diastolic (congestive) heart failure: Secondary | ICD-10-CM

## 2024-04-27 DIAGNOSIS — I2510 Atherosclerotic heart disease of native coronary artery without angina pectoris: Secondary | ICD-10-CM

## 2024-04-27 DIAGNOSIS — E785 Hyperlipidemia, unspecified: Secondary | ICD-10-CM | POA: Diagnosis not present

## 2024-04-27 DIAGNOSIS — K5730 Diverticulosis of large intestine without perforation or abscess without bleeding: Secondary | ICD-10-CM | POA: Diagnosis not present

## 2024-04-27 DIAGNOSIS — R109 Unspecified abdominal pain: Secondary | ICD-10-CM | POA: Diagnosis not present

## 2024-04-27 DIAGNOSIS — I480 Paroxysmal atrial fibrillation: Secondary | ICD-10-CM | POA: Diagnosis not present

## 2024-04-27 LAB — CBC WITH DIFFERENTIAL/PLATELET
Abs Immature Granulocytes: 0.03 K/uL (ref 0.00–0.07)
Basophils Absolute: 0 K/uL (ref 0.0–0.1)
Basophils Relative: 0 %
Eosinophils Absolute: 0.2 K/uL (ref 0.0–0.5)
Eosinophils Relative: 2 %
HCT: 35.9 % — ABNORMAL LOW (ref 39.0–52.0)
Hemoglobin: 11.8 g/dL — ABNORMAL LOW (ref 13.0–17.0)
Immature Granulocytes: 0 %
Lymphocytes Relative: 29 %
Lymphs Abs: 2.6 K/uL (ref 0.7–4.0)
MCH: 29.3 pg (ref 26.0–34.0)
MCHC: 32.9 g/dL (ref 30.0–36.0)
MCV: 89.1 fL (ref 80.0–100.0)
Monocytes Absolute: 1 K/uL (ref 0.1–1.0)
Monocytes Relative: 11 %
Neutro Abs: 5 K/uL (ref 1.7–7.7)
Neutrophils Relative %: 58 %
Platelets: 241 K/uL (ref 150–400)
RBC: 4.03 MIL/uL — ABNORMAL LOW (ref 4.22–5.81)
RDW: 14.3 % (ref 11.5–15.5)
WBC: 8.9 K/uL (ref 4.0–10.5)
nRBC: 0 % (ref 0.0–0.2)

## 2024-04-27 LAB — MAGNESIUM: Magnesium: 1.5 mg/dL — ABNORMAL LOW (ref 1.7–2.4)

## 2024-04-27 LAB — BASIC METABOLIC PANEL WITH GFR
Anion gap: 8 (ref 5–15)
BUN: 19 mg/dL (ref 8–23)
CO2: 26 mmol/L (ref 22–32)
Calcium: 8.7 mg/dL — ABNORMAL LOW (ref 8.9–10.3)
Chloride: 105 mmol/L (ref 98–111)
Creatinine, Ser: 2.05 mg/dL — ABNORMAL HIGH (ref 0.61–1.24)
GFR, Estimated: 33 mL/min — ABNORMAL LOW (ref >60)
Glucose, Bld: 100 mg/dL — ABNORMAL HIGH (ref 70–99)
Potassium: 4 mmol/L (ref 3.5–5.1)
Sodium: 139 mmol/L (ref 135–145)

## 2024-04-27 LAB — GLUCOSE, CAPILLARY
Glucose-Capillary: 91 mg/dL (ref 70–99)
Glucose-Capillary: 155 mg/dL — ABNORMAL HIGH (ref 70–99)

## 2024-04-27 LAB — TSH: TSH: 1.158 u[IU]/mL (ref 0.350–4.500)

## 2024-04-27 LAB — ECHOCARDIOGRAM COMPLETE
Area-P 1/2: 3.85 cm2
Height: 69 in
S' Lateral: 3.6 cm
Weight: 4000 [oz_av]

## 2024-04-27 LAB — HEPATIC FUNCTION PANEL
ALT: 12 U/L (ref 0–44)
AST: 22 U/L (ref 15–41)
Albumin: 3 g/dL — ABNORMAL LOW (ref 3.5–5.0)
Alkaline Phosphatase: 49 U/L (ref 38–126)
Bilirubin, Direct: <0.1 mg/dL (ref 0.0–0.2)
Total Bilirubin: 0.7 mg/dL (ref 0.0–1.2)
Total Protein: 6.1 g/dL — ABNORMAL LOW (ref 6.5–8.1)

## 2024-04-27 MED ORDER — HYDROMORPHONE HCL 1 MG/ML IJ SOLN
0.2000 mg | Freq: Once | INTRAMUSCULAR | Status: AC
  Administered 2024-04-27: 0.2 mg via INTRAVENOUS
  Filled 2024-04-27: qty 1

## 2024-04-27 MED ORDER — MAGNESIUM 400 MG PO CAPS: 1.0000 | ORAL_CAPSULE | Freq: Every day | ORAL | Status: AC

## 2024-04-27 MED ORDER — MAGNESIUM SULFATE 2 GM/50ML IV SOLN
2.0000 g | Freq: Once | INTRAVENOUS | Status: AC
  Administered 2024-04-27: 2 g via INTRAVENOUS
  Filled 2024-04-27: qty 50

## 2024-04-27 MED ORDER — ATORVASTATIN CALCIUM 10 MG PO TABS: 20.0000 mg | ORAL_TABLET | ORAL | Status: DC

## 2024-04-27 MED ORDER — IBUPROFEN 200 MG PO TABS
600.0000 mg | ORAL_TABLET | Freq: Once | ORAL | Status: AC
  Administered 2024-04-27: 600 mg via ORAL
  Filled 2024-04-27: qty 3

## 2024-04-27 NOTE — Progress Notes (Addendum)
 Cardiology Consultation   Patient ID: [name] [name] MRN: [id number]; DOB: 14-Jul-1946  Admit date: 04/26/2024 Date of Consult: 04/27/2024  PCP:  [name] Lenis, MD   [facility] Providers Cardiologist:  [name] Parchment, MD        Patient Profile: [name] [name] is a 78 y.o. 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  History of Present Illness: [name] [name] was diagnosed with paroxysmal AF and LBBB in 2019 after he presented to his PCP for palpitations. He was referred to the AF clinic. No intervention was needed to obtain sinus rhythm, though was noted he have a tendency to be in sinus bradycardia when not in AF. An echocardiogram was obtained at that time which showed LVEF 45-50% with diffuse hypokinesis and moderate LVH. G1 DD.  He had a stress test that suggested a prior infarct and LHC was pursued. This showed non-obstructive CAD with 50% distal Lcx stenosis. Etiology of cardiomyopathy was thought to be related to hypertensive disease, he was placed on GDMT. Most recent echocardiogram 2024 showed LVEF 55% with no RWMA. Abnormal septal motion 2/2 LBBB. G1 DD. His most recent outpatient visit with [name] Pouch PA-C 02/2024, patient was noted to be in sinus bradycardia (HR 49). Patient was asymptomatic at that time. His metoprolol  was decreased from 50 mg to 25 mg. Efforts to get patient on repatha were also pursued due to patient not meeting LDL goal on current regimen.   Presented to the ED on 8/28 for a witnessed syncopal event. Patient was at a car wash. He was leaning against the counter talking to the employee when he synopsized. Patient remembers waking up on the ground in employees arms, then promptly passing out again. He was able to hear  his name being called. Patient denied prodromal symptoms. Patient felt fine once awake. Patient denied hitting head, though remembers very little of event.  On EMS arrival patient was on the ground and BP 104/50, given 500 cc NS bolus. Received 1L of NS in ED.  In the ED HR 64  BP 122/69 Glucose 94  K 4.3  Cr 2.35 Troponin negative ECG sinus rhythm with LBBB VR 66; artifact present CXR showed cardiomegaly, no other acute process CT renal study showing renal cysts, diverticulosis, and aortic atherosclerosis [indication: patient noted new left sided back pain last night] Echocardiogram pending   On interview today, patient denied recent illness or systemic symptoms. He ate breakfast ~9am that morning, did not have lunch. Only had water /beverages at breakfast with medications. Syncope occurred ~3pm. He does note recently having dyspnea on exertion which is relatively new for him. Last week he had to stop multiple times walking up the stairs at church. Reports he is trying to get back into going to the gym with a friend.  Denied lightheadedness/dizziness, chest pain, palpitations,, orthopnea, PND,change in appetite, and peripheral edema Patient reported having a remote history of mini strokes decades ago. He does not remember what they did to diagnose it.   .    Past Medical History:  Diagnosis Date   Arthritis    CKD (chronic kidney disease), stage III (HCC)    Coronary artery disease    GERD (gastroesophageal reflux disease)    Gout    Heart murmur  History of TIA (transient ischemic attack)    Hypertension    Inguinal hernia    Left   Kidney cysts    Left   LBBB (left bundle branch block)    Non-ischemic cardiomyopathy (HCC)    Paroxysmal atrial fibrillation (HCC)    Sleep apnea 2018   CPAP occasionally   Stroke [facility])    TIA    Past Surgical History:  Procedure Laterality Date   COLONOSCOPY     HERNIA REPAIR Right    Inguinal Hernia   NASAL TURBINATE REDUCTION Bilateral  03/30/2023   Procedure: TURBINATE REDUCTION/SUBMUCOSAL RESECTION;  Surgeon: [name] Clark, MD;  Location: [facility] OR;  Service: ENT;  Laterality: Bilateral;   RIGHT/LEFT HEART CATH AND CORONARY ANGIOGRAPHY N/A 02/10/2018   Procedure: RIGHT/LEFT HEART CATH AND CORONARY ANGIOGRAPHY;  Surgeon: [name] Bruckner, MD;  Location: [facility] INVASIVE CV LAB;  Service: Cardiovascular;  Laterality: N/A;   SINUS ENDO WITH FUSION Bilateral 03/30/2023   Procedure: BILATERAL ANTERIOR ETHMOIDECTOMY, BILATERAL MAXILLARY ANTROSTOMY, FUSION IMAGE GUIDANCE;  Surgeon: [name] Clark, MD;  Location: [facility] OR;  Service: ENT;  Laterality: Bilateral;   XI ROBOTIC ASSISTED INGUINAL HERNIA REPAIR WITH MESH Left 01/12/2022   Procedure: XI ROBOTIC ASSISTED LEFT INGUINAL HERNIA REPAIR WITH MESH;  Surgeon: [name], [name] Righter, MD;  Location: [facility] ORS;  Service: General;  Laterality: Left;       Scheduled Meds:  allopurinol   300 mg Oral Daily   amLODipine   10 mg Oral q AM   [START ON 05/02/2024] atorvastatin   10 mg Oral Weekly   ezetimibe   10 mg Oral Daily   gabapentin   300 mg Oral TID   hydrochlorothiazide   25 mg Oral q AM   irbesartan   300 mg Oral Daily   metoprolol  succinate  25 mg Oral Daily   Rivaroxaban   15 mg Oral QPM   tamsulosin   0.4 mg Oral Daily   Continuous Infusions:  PRN Meds: acetaminophen  **OR** acetaminophen   Allergies:    Allergies  Allergen Reactions   Benazepril     Pt is unaware of allergy    Pravastatin     Pt is unaware of allergy    Prednisone  Nausea Only    Able to tolerate low doses   Valsartan     Pt is unaware of allergy     Social History:   Social History   Socioeconomic History   Marital status: Single    Spouse name: Not on file   Number of children: Not on file   Years of education: Not on file   Highest education level: Not on file  Occupational History   Not on file  Tobacco Use   Smoking status: Never   Smokeless tobacco: Never   Tobacco comments:    Quit smoking 1999   Vaping Use   Vaping status: Never Used  Substance and Sexual Activity   Alcohol use: No   Drug use: No   Sexual activity: Not Currently  Other Topics Concern   Not on file  Social History Narrative   Not on file   Social Drivers of Health   Financial Resource Strain: Not on file  Food Insecurity: No Food Insecurity (04/26/2024)   Hunger Vital Sign    Worried About Running Out of Food in the Last Year: Never true    Ran Out of Food in the Last Year: Never true  Transportation Needs: No Transportation Needs (04/26/2024)   PRAPARE - Administrator, Civil Service (Medical): No  Lack of Transportation (Non-Medical): No  Physical Activity: Not on file  Stress: Not on file  Social Connections: Moderately Integrated (04/26/2024)   Social Connection and Isolation Panel    Frequency of Communication with Friends and Family: More than three times a week    Frequency of Social Gatherings with Friends and Family: Three times a week    Attends Religious Services: More than 4 times per year    Active Member of Clubs or Organizations: Yes    Attends Banker Meetings: More than 4 times per year    Marital Status: Widowed  Intimate Partner Violence: Not At Risk (04/26/2024)   Humiliation, Afraid, Rape, and Kick questionnaire    Fear of Current or Ex-Partner: No    Emotionally Abused: No    Physically Abused: No    Sexually Abused: No    Family History:   Family History  Problem Relation Age of Onset   Cancer Mother    Hypertension Mother    Heart disease Father    Hypertension Father    Hypertension Sister    Hypertension Brother      ROS:  Please see the history of present illness.  All other ROS reviewed and negative.     Physical Exam/Data: Vitals:   04/26/24 2059 04/26/24 2346 04/27/24 0425 04/27/24 0822  BP: (!) 150/71 126/71 123/72 135/64  Pulse: 70 66 64 62  Resp: 20  18 18   Temp: 98.2 F (36.8 C) 98.1 F (36.7 C) 98.5 F (36.9 C) 97.7 F  (36.5 C)  TempSrc:   Oral Oral  SpO2: 98% 96% 98% 100%  Weight:      Height:        Intake/Output Summary (Last 24 hours) at 04/27/2024 1033 Last data filed at 04/27/2024 0640 Gross per 24 hour  Intake 790 ml  Output 425 ml  Net 365 ml      04/26/2024    4:36 PM 03/26/2024    3:15 PM 10/18/2023    5:48 PM  Last 3 Weights  Weight (lbs) 250 lb 263 lb 259 lb 0.7 oz  Weight (kg) 113.399 kg 119.296 kg 117.5 kg     Body mass index is 36.92 kg/m.  General:  Well nourished, well developed, in no acute distress HEENT: normal Vascular: No carotid bruits; Distal pulses 2+ bilaterally Cardiac:  normal S1, S2; RRR; no murmur  Lungs:  clear to auscultation bilaterally, no wheezing, rhonchi or rales   Ext: no edema Musculoskeletal:  No deformities. Tender to palpation of the left paraspinal area in the lumbar region.  Skin: warm and dry  Neuro:  CNs 2-12 intact, no focal abnormalities noted Psych:  Normal affect   EKG:  The EKG was personally reviewed and demonstrates:  see hpi Telemetry:  Telemetry was personally reviewed and demonstrates:  sinus bradycardia at night avg Hr 55, normal sinus rhythm during day avg hr 70. LBBB  Relevant CV Studies: Echocardiogram 05/12/23 IMPRESSIONS     1. Left ventricular ejection fraction, by estimation, is 55%. Left  ventricular ejection fraction by 3D volume is 59 %. The left ventricle has  low normal function. The left ventricle has no regional wall motion  abnormalities. Abnormal septal motion due  to LBBB. There is mild left ventricular hypertrophy. Left ventricular  diastolic parameters are consistent with Grade I diastolic dysfunction  (impaired relaxation).   2. Right ventricular systolic function is normal. The right ventricular  size is normal. Tricuspid regurgitation signal is inadequate for assessing  PA pressure.   3. The mitral valve is grossly normal. Trivial mitral valve  regurgitation. No evidence of mitral stenosis.   4. The  aortic valve is tricuspid. Aortic valve regurgitation is trivial.  No aortic stenosis is present.   5. The inferior vena cava is normal in size with greater than 50%  respiratory variability, suggesting right atrial pressure of 3 mmHg.   LHC 2019 Conclusions: Non-obstructive coronary artery disease, including 50% distal LCx stenosis. Normal left and right heart filling pressures. Upper normal pulmonary artery pressure. Normal Fick cardiac output/index.   Recommendations: Medical therapy and risk factor modification to prevent progression of coronary artery disease. Continue medical management of non-ischemic cardiomyopathy.  Escalation of antihypertensive regimen will need to be considered. If no evidence of bleeding or vascular injury, rivaroxaban  can be restarted tomorrow evening. Diagnostic Dominance: Co-dominant   Laboratory Data: High Sensitivity Troponin:   Recent Labs  Lab 04/26/24 1823 04/26/24 2135  TROPONINIHS 9 10     Chemistry Recent Labs  Lab 04/26/24 1823 04/27/24 0130  NA 140 139  K 4.3 4.0  CL 105 105  CO2 24 26  GLUCOSE 94 100*  BUN 20 19  CREATININE 2.35* 2.05*  CALCIUM  9.0 8.7*  MG  --  1.5*  GFRNONAA 28* 33*  ANIONGAP 11 8    Recent Labs  Lab 04/26/24 1823 04/27/24 0130  PROT 6.9 6.1*  ALBUMIN 3.4* 3.0*  AST 22 22  ALT 13 12  ALKPHOS 54 49  BILITOT 0.8 0.7   Lipids No results for input(s): CHOL, TRIG, HDL, LABVLDL, LDLCALC, CHOLHDL in the last 168 hours.  Hematology Recent Labs  Lab 04/26/24 1823 04/27/24 0130  WBC 8.2 8.9  RBC 4.26 4.03*  HGB 12.4* 11.8*  HCT 38.2* 35.9*  MCV 89.7 89.1  MCH 29.1 29.3  MCHC 32.5 32.9  RDW 14.4 14.3  PLT 240 241   Thyroid  Recent Labs  Lab 04/26/24 2135  TSH 1.158    BNPNo results for input(s): BNP, PROBNP in the last 168 hours.  DDimer No results for input(s): DDIMER in the last 168 hours.  Radiology/Studies:  CT RENAL STONE STUDY Result Date: 04/27/2024 CLINICAL  DATA:  Abdominal pain EXAM: CT ABDOMEN AND PELVIS WITHOUT CONTRAST TECHNIQUE: Multidetector CT imaging of the abdomen and pelvis was performed following the standard protocol without IV contrast. RADIATION DOSE REDUCTION: This exam was performed according to the departmental dose-optimization program which includes automated exposure control, adjustment of the mA and/or kV according to patient size and/or use of iterative reconstruction technique. COMPARISON:  10/18/2023 FINDINGS: Lower chest: No acute abnormality. Hepatobiliary: No focal liver abnormality is seen. No gallstones, gallbladder wall thickening, or biliary dilatation. Pancreas: Unremarkable. No pancreatic ductal dilatation or surrounding inflammatory changes. Spleen: Normal in size without focal abnormality. Adrenals/Urinary Tract: Adrenal glands are within normal limits bilaterally. Bilateral renal cystic change is noted left greater than right. The largest of these measures 9.1 cm in dimension. No follow-up is recommended. No renal calculi are noted. The bladder is decompressed. Stomach/Bowel: Scattered diverticular change of the colon is noted without evidence of diverticulitis. The appendix is within normal limits. The small bowel is within normal limits. Stomach is decompressed. Vascular/Lymphatic: Aortic atherosclerosis. No enlarged abdominal or pelvic lymph nodes. Reproductive: Prostate is unremarkable. Other: No abdominal wall hernia or abnormality. No abdominopelvic ascites. Musculoskeletal: Degenerative changes of lumbar spine are noted. IMPRESSION: Diverticulosis without diverticulitis. No acute abnormality noted. Electronically Signed   By: [name] Evelyne HERO.D.  On: 04/27/2024 03:49   DG Chest Port 1 View Result Date: 04/26/2024 CLINICAL DATA:  Syncope.  Clemens due to syncopal episode. EXAM: PORTABLE CHEST 1 VIEW COMPARISON:  10/08/2021 FINDINGS: Cardiac enlargement. No vascular congestion, edema, or consolidation. No pleural effusion or  pneumothorax. Mediastinal contours appear intact. Degenerative changes in the spine and shoulders. IMPRESSION: No active disease. Electronically Signed   By: Elsie Gravely M.D.   On: 04/26/2024 16:56     Assessment and Plan: Syncope Patient describing sudden onset of LOC without prodromal symptoms. He reported decreased PO intake prior to episode. Soft BP and mildly elevated Cr that improved with IV hydration. Most likely etiology of syncope was due to dehydration.Though, given remote history of small CVA vs TIA and patient being found on ground without full recollection while on xarelto , would recommend head imaging to rule out intracranial pathology.  Do not think this is due to ACS or an arrhyhtmia, will defer heart monitor.  Echocardiogram pending. Will assess for RWMA.  DOE Multifactorial, could be due to worsening cardiomyopathy or anginal equivalent though most likely etiology is deconditioning.  CXR shows no evidence of volume overload, and patient is euvolemic on physical exam.  Denied chest pain, ECG without acute changes and troponin negative.  Will assess for RWMA on echocardiogram, may benefit from stress imaging outpatient as he is intermediate risk, would defer CCTA 2/2 renal function.   Paroxysmal Atrial Fibrillation Sinus bradycardia Chronic LBBB Hypomagnesemia (1.5) being repleted  Has remained in NSR this admission with some episodes of bradycardia during sleep.  Do not suspect etiology of syncope, chronic issues and patient has remained asymptomatic. No palpitations since 2019. Continue metoprolol  succinate 25 mg Continue xarelto  15 mg, reduced 2/2 CKD  HFrecEF -NICM History of HFrEF in 2019 thought to be due to hypertensive disease. See HPI for more information. Most recent echocardiogram in 2024 showed recovered EF (55% in 2024) See DOE above. Echo pending, will follow up on results.  Euvolemic on exam  Continue metoprolol  succinate 25 mg Continue irbesartan   300 mg Defer SGLT2i/MRA 2/2 renal function  If EF is reduced could consider BiDil  Hypertension PTA hydrochlorothiazide  25 mg, irbesartan  300 mg, and amlodipine  10 mg BP: 135/64 Continue home medications  CAD- non obstructive  See DOE above. Echo pending, will follow up on results.  Not on ASA 2/2 chronic anticoagulation with xarelto   Hyperlipidemia Outpatient efforts to get him on repatha, patient recently received approval for grant Continue Lipitor 20 mg    Continue Zetia  10 mg  CKD  Cr 2.35 -> 2.05 with IV hydration  OSA not on CPAP Patient stopped using CPAP about a year ago due to sinus issues requiring intervention with ENT. He has not restarted.  Risk Assessment/Risk Scores:       New [place]  Heart Association (NYHA) Functional Class NYHA Class II  CHA2DS2-VASc Score = 7   This indicates a 11.2% annual risk of stroke. The patient's score is based upon: CHF History: 1 HTN History: 1 Diabetes History: 0 Stroke History: 2 Vascular Disease History: 1 Age Score: 2 Gender Score: 0   For questions or updates, please contact [facility] Please consult [email or web address] for contact info under   Signed, Leontine LOISE Garrick DEVONNA  04/27/2024 10:33 AM  Patient seen and examined, note reviewed with the signed Advanced Practice Provider. I personally reviewed laboratory data, imaging studies and relevant notes. I independently examined the patient and formulated the important aspects of the plan. I have personally  discussed the plan with the patient and/or family. Comments or changes to the note/plan are indicated below.  Syncope Dyspnea on exertion - chronic suspect decondition may be playing a role here PAF in sinus rhythm Nonischemic cardiomyopathy with improved ejection  Chronic LBBB  Suspect vasovagal syncope versus diuretics induced volume depletion - his blood pressure was marginal immediately after his episode but improved with fluid. No evidence arrhythmia  at the time.  I reviewed his echo normal EF with no wall motion abnormalities appreciated - final report pending  Blood pressure is appropriate.  Encourage hydration  HLN - Lipitor 20 mg daily and Zetia  10 mg daily Cr has improve    Tayshaun Kroh DO, MS [facility] Attending Cardiologist [facility]  [address] [place], [place] [address] [phone] Website: [email or web address]

## 2024-04-27 NOTE — Progress Notes (Signed)
 Mobility Specialist: Progress Note   04/27/24 1000  Orthostatic Lying   BP- Lying 137/73  Pulse- Lying 61  Orthostatic Sitting  BP- Sitting 140/77  Pulse- Sitting 68  Orthostatic Standing at 0 minutes  BP- Standing at 0 minutes 149/76  Pulse- Standing at 0 minutes 71  Mobility  Activity Stood at bedside  Level of Assistance Standby assist, set-up cues, supervision of patient - no hands on  Assistive Device None  Activity Response Tolerated well  Mobility Referral Yes  Mobility visit 1 Mobility  Mobility Specialist Start Time (ACUTE ONLY) 0912  Mobility Specialist Stop Time (ACUTE ONLY) [id number]  Mobility Specialist Time Calculation (min) (ACUTE ONLY) 12 min    Orthostatic Standing - 3 min: BP 153/76, HR 72  Pt received in bed, pleasant and agreeable to mobility session. Completed orthostatics (see attached flowsheet). Denies any dizziness or lightheadedness. C/o low back pain and L hip pain, declined ambulation at this time. Left on EOB with all needs met, call bell in reach.    Ileana Lute Mobility Specialist Please contact via SecureChat or Rehab office at [phone]

## 2024-04-27 NOTE — Progress Notes (Signed)
 Transition of Care [facility]) - Inpatient Brief Assessment   Patient Details  Name: DEGAN HANSER MRN: [id number] Date of Birth: 1945-11-12  Transition of [facility]) CM/SW Contact:    Rosaline JONELLE Joe, RN Phone Number: 04/27/2024, 10:36 AM   Clinical Narrative: CM met with the patient at the bedside to discuss IP Care management needs.  Patient admitted for Syncope and plans to return home when stable.  Patient is independent.    DME at the home includes cane, RW.  Patient normally drives.  PCP - Dr. Mick.  No other IP Care management needs at this time.  Patient will discharge home when stable.   Transition of Care Asessment: Insurance and Status: (P) Insurance coverage has been reviewed Patient has primary care physician: (P) Yes Home environment has been reviewed: (P) from home with daughter Prior level of function:: (P) self Prior/Current Home Services: (P) No current home services Social Drivers of Health Review: (P) SDOH reviewed interventions complete Readmission risk has been reviewed: (P) Yes Transition of care needs: (P) no transition of care needs at this time

## 2024-04-27 NOTE — Discharge Summary (Signed)
 Physician Discharge Summary  PIUS BYROM FMW:[id number] DOB: 04/23/1946 DOA: 04/26/2024  PCP: Seabron Lenis, MD  Admit date: 04/26/2024 Discharge date: 04/27/2024  Admitted From: Home  Discharge disposition: Home   Recommendations for Outpatient Follow-Up:   Follow up with your primary care provider in one week.  Check CBC, BMP, magnesium  in the next visit   Discharge Diagnosis:   Principal Problem:   Syncope and collapse Active Problems:   Cardiomyopathy (HCC)   Paroxysmal atrial fibrillation (HCC)   Left bundle branch block   Chronic kidney disease, stage III (moderate) (HCC)   Anemia   Essential hypertension   History of gout   Discharge Condition: Improved.  Diet recommendation: Low sodium, heart healthy.   Wound care: None.  Code status: Full.   History of Present Illness:    [name] [name] is a 78 y.o. male with history of paroxysmal atrial fibrillation, chronic LBBB, chronic kidney disease stage III, hypertension, BPH, gout was brought into the hospital after losing consciousness while cleaning her car.  No chest pain, dizziness lightheadedness prior to the episode.  Patient had recently seen at cardiology office and metoprolol  dose was decreased due to bradycardia.  In the ED, CBC with hemoglobin of 12.4.  Chemistry notable for creatinine elevation at 2.3.  Urinalysis was negative for infection.  Patient had normal sinus rhythm with LBBB.  Troponins were negative.   Patient was then placed in for observation in the hospital.   Hospital Course:   Following conditions were addressed during hospitalization as listed below,  Syncope -likely vasovagal.  2D echocardiogram with preserved LV function with left ventricular ejection fraction of 50 to 55% with no regional wall motion abnormality and LVH with grade 1 diastolic dysfunction.   Seen by cardiology during hospitalization.  Patient is on multiple medications so was advised adequate hydration and  orthostatic precautions on discharge.  Hypomagnesemia.  Magnesium  of 1.5 today.  Received 2 g of IV magnesium  sulfate and will continue magnesium  supplementation on discharge.  Paroxysmal atrial fibrillation  Currently in normal sinus rhythm.  Continue Eliquis and beta-blockers.  Was recently decreased on beta-blocker dose as outpatient for bradycardia.  Currently rate controlled and heart rate in the 60s.  Essential hypertension on amlodipine  ARB and HCTZ and beta-blockers.  Will continue on discharge.  Chronic kidney disease stage III creatinine is around baseline.  Creatinine today at 2.0.  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  Follow-up as outpatient.  History of gout on allopurinol .  Class 1 obesity.  Body mass index is 36.92 kg/m. Would benefit from lifestyle modification.  Chronic LBBB. Nonacute  Disposition.  At this time, patient is stable for disposition home with outpatient PCP follow-up.  Medical Consultants:   Cardiology  Procedures:    2D echocardiogram Subjective:   Today, patient was seen and examined at bedside.  Denies any dizziness lightheadedness shortness of breath chest pain.  Wants to go home.  Seen by cardiology.  Discharge Exam:   Vitals:   04/27/24 0425 04/27/24 0822  BP: 123/72 135/64  Pulse: 64 62  Resp: 18 18  Temp: 98.5 F (36.9 C) 97.7 F (36.5 C)  SpO2: 98% 100%   Vitals:   04/26/24 2059 04/26/24 2346 04/27/24 0425 04/27/24 0822  BP: (!) 150/71 126/71 123/72 135/64  Pulse: 70 66 64 62  Resp: 20  18 18   Temp: 98.2 F (36.8 C) 98.1 F (36.7 C) 98.5 F (36.9 C) 97.7 F (36.5 C)  TempSrc:   Oral Oral  SpO2: 98% 96% 98% 100%  Weight:      Height:       Body mass index is 36.92 kg/m.   General: Alert awake, not in obvious distress, obese built HENT: pupils equally reacting to light,  No scleral pallor or icterus noted. Oral mucosa is moist.  Chest: Diminished breath sounds bilaterally no crackles or wheezes.  CVS: S1 &S2  heard. No murmur.  Regular rate and rhythm. Abdomen: Soft, nontender, nondistended.  Bowel sounds are heard.   Extremities: No cyanosis, clubbing or edema.  Peripheral pulses are palpable. Psych: Alert, awake and oriented, normal mood CNS:  No cranial nerve deficits.  Power equal in all extremities.   Skin: Warm and dry.  No rashes noted.  The results of significant diagnostics from this hospitalization (including imaging, microbiology, ancillary and laboratory) are listed below for reference.     Diagnostic Studies:   ECHOCARDIOGRAM COMPLETE Result Date: 04/27/2024    ECHOCARDIOGRAM REPORT   Patient Name:   [name] [name] Date of Exam: 04/27/2024 Medical Rec #:  [id number]           Height:       69.0 in Accession #:    [id number]          Weight:       250.0 lb Date of Birth:  [birth year]           BSA:          2.272 m Patient Age:    78 years            BP:           123/72 mmHg Patient Gender: M                   HR:           61 bpm. Exam Location:  Inpatient Procedure: 2D Echo, Cardiac Doppler and Color Doppler (Both Spectral and Color            Flow Doppler were utilized during procedure). Indications:    Syncope  History:        Patient has prior history of Echocardiogram examinations, most                 recent 05/12/2023. CAD, Arrythmias:LBBB, Signs/Symptoms:Shortness                 of Breath; Risk Factors:Hypertension. CKD STAGE 3.  Sonographer:    Philomena Daring Referring Phys: [id number] ARSHAD N KAKRAKANDY IMPRESSIONS  1. Left ventricular ejection fraction, by estimation, is 50 to 55%. The left ventricle has low normal function. The left ventricle has no regional wall motion abnormalities. There is moderate left ventricular hypertrophy. Left ventricular diastolic parameters are consistent with Grade I diastolic dysfunction (impaired relaxation).  2. Right ventricular systolic function is normal. The right ventricular size is normal. Tricuspid regurgitation signal is inadequate for assessing  PA pressure.  3. The mitral valve is grossly normal. Trivial mitral valve regurgitation.  4. The aortic valve is tricuspid. Aortic valve regurgitation is not visualized. Aortic valve sclerosis/calcification is present, without any evidence of aortic stenosis.  5. Rhythm strip during this exam demonstrates with LBBB and normal sinus rhythm. Comparison(s): Changes from prior study are noted. 05/12/2023: LVEF 55%. FINDINGS  Left Ventricle: Left ventricular ejection fraction, by estimation, is 50 to 55%. The left ventricle has low normal function. The left ventricle has no regional wall motion abnormalities. The left ventricular internal cavity size was normal  in size. There is moderate left ventricular hypertrophy. Left ventricular diastolic parameters are consistent with Grade I diastolic dysfunction (impaired relaxation). Indeterminate filling pressures. Right Ventricle: The right ventricular size is normal. No increase in right ventricular wall thickness. Right ventricular systolic function is normal. Tricuspid regurgitation signal is inadequate for assessing PA pressure. Left Atrium: Left atrial size was normal in size. Right Atrium: Right atrial size was normal in size. Pericardium: There is no evidence of pericardial effusion. Mitral Valve: The mitral valve is grossly normal. Trivial mitral valve regurgitation. Tricuspid Valve: The tricuspid valve is normal in structure. Tricuspid valve regurgitation is not demonstrated. Aortic Valve: The aortic valve is tricuspid. Aortic valve regurgitation is not visualized. Aortic valve sclerosis/calcification is present, without any evidence of aortic stenosis. Pulmonic Valve: The pulmonic valve was normal in structure. Pulmonic valve regurgitation is not visualized. Aorta: The aortic root and ascending aorta are structurally normal, with no evidence of dilitation. IAS/Shunts: No atrial level shunt detected by color flow Doppler. EKG: Rhythm strip during this exam demonstrates  with LBBB and normal sinus rhythm.  LEFT VENTRICLE PLAX 2D LVIDd:         4.90 cm   Diastology LVIDs:         3.60 cm   LV e' medial:    6.74 cm/s LV PW:         1.30 cm   LV E/e' medial:  9.6 LV IVS:        1.30 cm   LV e' lateral:   8.49 cm/s LVOT diam:     2.50 cm   LV E/e' lateral: 7.6 LV SV:         104 LV SV Index:   46 LVOT Area:     4.91 cm  RIGHT VENTRICLE RV S prime:     13.60 cm/s TAPSE (M-mode): 2.0 cm LEFT ATRIUM             Index        RIGHT ATRIUM           Index LA diam:        4.40 cm 1.94 cm/m   RA Area:     13.30 cm LA Vol (A2C):   56.8 ml 25.01 ml/m  RA Volume:   29.10 ml  12.81 ml/m LA Vol (A4C):   46.6 ml 20.51 ml/m LA Biplane Vol: 55.0 ml 24.21 ml/m  AORTIC VALVE LVOT Vmax:   87.50 cm/s LVOT Vmean:  59.700 cm/s LVOT VTI:    0.211 m  AORTA Ao Root diam: 3.20 cm Ao Asc diam:  3.50 cm MITRAL VALVE MV Area (PHT): 3.85 cm    SHUNTS MV Decel Time: 197 msec    Systemic VTI:  0.21 m MV E velocity: 64.50 cm/s  Systemic Diam: 2.50 cm MV A velocity: 92.80 cm/s MV E/A ratio:  0.70 Vinie Maxcy MD Electronically signed by Vinie Maxcy MD Signature Date/Time: 04/27/2024/11:25:31 AM    Final    CT RENAL STONE STUDY Result Date: 04/27/2024 CLINICAL DATA:  Abdominal pain EXAM: CT ABDOMEN AND PELVIS WITHOUT CONTRAST TECHNIQUE: Multidetector CT imaging of the abdomen and pelvis was performed following the standard protocol without IV contrast. RADIATION DOSE REDUCTION: This exam was performed according to the departmental dose-optimization program which includes automated exposure control, adjustment of the mA and/or kV according to patient size and/or use of iterative reconstruction technique. COMPARISON:  10/18/2023 FINDINGS: Lower chest: No acute abnormality. Hepatobiliary: No focal liver abnormality is seen. No gallstones, gallbladder  wall thickening, or biliary dilatation. Pancreas: Unremarkable. No pancreatic ductal dilatation or surrounding inflammatory changes. Spleen: Normal in size without  focal abnormality. Adrenals/Urinary Tract: Adrenal glands are within normal limits bilaterally. Bilateral renal cystic change is noted left greater than right. The largest of these measures 9.1 cm in dimension. No follow-up is recommended. No renal calculi are noted. The bladder is decompressed. Stomach/Bowel: Scattered diverticular change of the colon is noted without evidence of diverticulitis. The appendix is within normal limits. The small bowel is within normal limits. Stomach is decompressed. Vascular/Lymphatic: Aortic atherosclerosis. No enlarged abdominal or pelvic lymph nodes. Reproductive: Prostate is unremarkable. Other: No abdominal wall hernia or abnormality. No abdominopelvic ascites. Musculoskeletal: Degenerative changes of lumbar spine are noted. IMPRESSION: Diverticulosis without diverticulitis. No acute abnormality noted. Electronically Signed   By: Oneil Devonshire M.D.   On: 04/27/2024 03:49   DG Chest Port 1 View Result Date: 04/26/2024 CLINICAL DATA:  Syncope.  Clemens due to syncopal episode. EXAM: PORTABLE CHEST 1 VIEW COMPARISON:  10/08/2021 FINDINGS: Cardiac enlargement. No vascular congestion, edema, or consolidation. No pleural effusion or pneumothorax. Mediastinal contours appear intact. Degenerative changes in the spine and shoulders. IMPRESSION: No active disease. Electronically Signed   By: Elsie Gravely M.D.   On: 04/26/2024 16:56     Labs:   Basic Metabolic Panel: Recent Labs  Lab 04/26/24 1823 04/27/24 0130  NA 140 139  K 4.3 4.0  CL 105 105  CO2 24 26  GLUCOSE 94 100*  BUN 20 19  CREATININE 2.35* 2.05*  CALCIUM  9.0 8.7*  MG  --  1.5*   GFR Estimated Creatinine Clearance: 38.1 mL/min (A) (by C-G formula based on SCr of 2.05 mg/dL (H)). Liver Function Tests: Recent Labs  Lab 04/26/24 1823 04/27/24 0130  AST 22 22  ALT 13 12  ALKPHOS 54 49  BILITOT 0.8 0.7  PROT 6.9 6.1*  ALBUMIN 3.4* 3.0*   No results for input(s): LIPASE, AMYLASE in the last  168 hours. No results for input(s): AMMONIA in the last 168 hours. Coagulation profile No results for input(s): INR, PROTIME in the last 168 hours.  CBC: Recent Labs  Lab 04/26/24 1823 04/27/24 0130  WBC 8.2 8.9  NEUTROABS 5.8 5.0  HGB 12.4* 11.8*  HCT 38.2* 35.9*  MCV 89.7 89.1  PLT 240 241   Cardiac Enzymes: No results for input(s): CKTOTAL, CKMB, CKMBINDEX, TROPONINI in the last 168 hours. BNP: Invalid input(s): POCBNP CBG: Recent Labs  Lab 04/26/24 1629 04/27/24 0821 04/27/24 1222  GLUCAP 94 91 155*   D-Dimer No results for input(s): DDIMER in the last 72 hours. Hgb A1c No results for input(s): HGBA1C in the last 72 hours. Lipid Profile No results for input(s): CHOL, HDL, LDLCALC, TRIG, CHOLHDL, LDLDIRECT in the last 72 hours. Thyroid function studies Recent Labs    04/26/24 2135  TSH 1.158   Anemia work up No results for input(s): VITAMINB12, FOLATE, FERRITIN, TIBC, IRON, RETICCTPCT in the last 72 hours. Microbiology No results found for this or any previous visit (from the past 240 hours).   Discharge Instructions:   Discharge Instructions     Diet - low sodium heart healthy   Complete by: As directed    Discharge instructions   Complete by: As directed    Follow-up with your primary care provider in 1 week.  Check blood work at that time.  Seek medical attention for worsening symptoms.   Increase activity slowly   Complete by: As directed  Allergies as of 04/27/2024       Reactions   Benazepril    Pt is unaware of allergy    Pravastatin    Pt is unaware of allergy    Prednisone  Nausea Only   Able to tolerate low doses   Valsartan    Pt is unaware of allergy         Medication List     STOP taking these medications    amoxicillin  500 MG capsule Commonly known as: AMOXIL    hydrocortisone  2.5 % cream   hydrocortisone  2.5 % rectal cream Commonly known as: ANUSOL -HC    hydrocortisone  25 MG suppository Commonly known as: ANUSOL -HC   methylPREDNISolone  4 MG Tbpk tablet Commonly known as: MEDROL  DOSEPAK   polyethylene glycol-electrolytes 420 g solution Commonly known as: NuLYTELY    promethazine  25 MG tablet Commonly known as: PHENERGAN    promethazine -dextromethorphan 6.25-15 MG/5ML syrup Commonly known as: PROMETHAZINE -DM       TAKE these medications    acetaminophen  325 MG tablet Commonly known as: TYLENOL  Take 325 mg by mouth every 6 (six) hours as needed for mild pain or fever.   allopurinol  300 MG tablet Commonly known as: ZYLOPRIM  Take 1 tablet (300 mg total) by mouth daily. What changed: Another medication with the same name was removed. Continue taking this medication, and follow the directions you see here.   amLODipine  10 MG tablet Commonly known as: NORVASC  Take 1 tablet (10 mg total) by mouth in the morning. What changed: Another medication with the same name was removed. Continue taking this medication, and follow the directions you see here.   atorvastatin  10 MG tablet Commonly known as: LIPITOR Take 1 tablet (10 mg total) by mouth once a week. What changed: Another medication with the same name was removed. Continue taking this medication, and follow the directions you see here.   bisacodyl 5 MG EC tablet Generic drug: bisacodyl Take 20 mg by mouth as directed.   cetirizine  10 MG tablet Commonly known as: ZyrTEC  Allergy Take 1 tablet (10 mg total) by mouth daily.   colchicine  0.6 MG tablet Take 2 tablets (1.2 mg total) by mouth immediately at onset of gout attack. May repeat dose in 1 hour if needed, then 2 tabs once a day as needed. What changed: Another medication with the same name was removed. Continue taking this medication, and follow the directions you see here.   dextromethorphan-guaiFENesin  30-600 MG 12hr tablet Commonly known as: MUCINEX  DM Take 1 tablet by mouth 2 (two) times daily.   diclofenac  Sodium  1 % Gel Commonly known as: VOLTAREN  Apply topically twice daily as directed What changed: Another medication with the same name was removed. Continue taking this medication, and follow the directions you see here.   ezetimibe  10 MG tablet Commonly known as: ZETIA  Take 1 tablet (10 mg total) by mouth daily. What changed: Another medication with the same name was removed. Continue taking this medication, and follow the directions you see here.   fluticasone 50 MCG/ACT nasal spray Commonly known as: FLONASE Place 1 spray into both nostrils at bedtime.   gabapentin  300 MG capsule Commonly known as: NEURONTIN  Take 1 capsule (300 mg total) by mouth 3 (three) times daily. What changed: Another medication with the same name was removed. Continue taking this medication, and follow the directions you see here.   hydrochlorothiazide  25 MG tablet Commonly known as: HYDRODIURIL  Take 1 tablet (25 mg total) by mouth in the morning. What changed: Another medication with  the same name was removed. Continue taking this medication, and follow the directions you see here.   ipratropium 0.03 % nasal spray Commonly known as: ATROVENT Place 2 sprays into both nostrils at bedtime as needed for rhinitis.   irbesartan  300 MG tablet Commonly known as: AVAPRO  Take 1 tablet (300 mg total) by mouth daily. What changed: Another medication with the same name was removed. Continue taking this medication, and follow the directions you see here.   lidocaine  5 % Commonly known as: Lidoderm  Place 1 patch onto the skin daily. Remove & Discard patch within 12 hours or as directed by MD   Linzess 72 MCG capsule Generic drug: linaclotide Take 72 mcg by mouth daily as needed (Constipation).   Magnesium  400 MG Caps Take 1 capsule by mouth daily.   metoprolol  succinate 25 MG 24 hr tablet Commonly known as: Toprol  XL Take 1 tablet (25 mg total) by mouth daily.   multivitamin with minerals Tabs tablet Take 1  tablet by mouth daily.   omeprazole  40 MG capsule Commonly known as: PRILOSEC Take 1 capsule (40 mg total) by mouth daily. What changed: Another medication with the same name was removed. Continue taking this medication, and follow the directions you see here.   ondansetron  4 MG disintegrating tablet Commonly known as: ZOFRAN -ODT Take 1 tablet (4 mg total) by mouth every 8 (eight) hours as needed for nausea or vomiting.   oxyCODONE  5 MG immediate release tablet Commonly known as: Roxicodone  Take 1 tablet (5 mg total) by mouth every 6 (six) hours as needed for breakthrough pain.   polyethylene glycol 17 g packet Commonly known as: MIRALAX  / GLYCOLAX  Take 17 g by mouth daily as needed for moderate constipation.   tadalafil 5 MG tablet Commonly known as: CIALIS Take 5 mg by mouth daily as needed.   tamsulosin  0.4 MG Caps capsule Commonly known as: FLOMAX  Take 1 capsule (0.4 mg total) by mouth daily. What changed: Another medication with the same name was removed. Continue taking this medication, and follow the directions you see here.   traMADol 50 MG tablet Commonly known as: ULTRAM Take 50 mg by mouth every 6 (six) hours as needed for severe pain.   witch hazel-glycerin  pad Commonly known as: TUCKS Apply 1 Application topically as needed for itching.   Xarelto  15 MG Tabs tablet Generic drug: Rivaroxaban  Take 1 tablet (15 mg total) by mouth every evening with supper What changed: Another medication with the same name was removed. Continue taking this medication, and follow the directions you see here.        Follow-up Information     Seabron Lenis, MD Follow up.   Specialty: Family Medicine Contact information: [phone] [address] [place] [address] [phone]                  Time coordinating discharge: 39 minutes  Signed:  Sisto Granillo  [facility] 04/27/2024, 1:40 PM

## 2024-04-27 NOTE — Plan of Care (Signed)
  Problem: Education: Goal: Knowledge of General Education information will improve Description: Including pain rating scale, medication(s)/side effects and non-pharmacologic comfort measures Outcome: Adequate for Discharge   Problem: Health Behavior/Discharge Planning: Goal: Ability to manage health-related needs will improve Outcome: Adequate for Discharge   Problem: Clinical Measurements: Goal: Ability to maintain clinical measurements within normal limits will improve Outcome: Adequate for Discharge Goal: Will remain free from infection Outcome: Adequate for Discharge Goal: Diagnostic test results will improve Outcome: Adequate for Discharge Goal: Respiratory complications will improve Outcome: Adequate for Discharge Goal: Cardiovascular complication will be avoided Outcome: Adequate for Discharge   Problem: Activity: Goal: Risk for activity intolerance will decrease Outcome: Adequate for Discharge   Problem: Nutrition: Goal: Adequate nutrition will be maintained Outcome: Adequate for Discharge   Problem: Coping: Goal: Level of anxiety will decrease Outcome: Adequate for Discharge   Problem: Elimination: Goal: Will not experience complications related to bowel motility Outcome: Adequate for Discharge Goal: Will not experience complications related to urinary retention Outcome: Adequate for Discharge   Problem: Pain Managment: Goal: General experience of comfort will improve and/or be controlled Outcome: Adequate for Discharge

## 2024-04-27 NOTE — Progress Notes (Signed)
 DC order noted per MD. DC RN at bedside. PIVs removed. AVS printed/reviewed. Patient agreeable with discharge plans anticipating family to arrive soon. All belongings accounted for. Skin intact. No telemonitor on the patient. Patient agreeable to go to the [facility]. Patient wheeled downstairs to await transportation.

## 2024-04-27 NOTE — Progress Notes (Signed)
 Patient transported down to CT via wheelchair

## 2024-04-27 NOTE — Plan of Care (Signed)

## 2024-04-29 DIAGNOSIS — I428 Other cardiomyopathies: Secondary | ICD-10-CM | POA: Diagnosis not present

## 2024-04-29 DIAGNOSIS — I129 Hypertensive chronic kidney disease with stage 1 through stage 4 chronic kidney disease, or unspecified chronic kidney disease: Secondary | ICD-10-CM | POA: Diagnosis not present

## 2024-04-29 DIAGNOSIS — N1832 Chronic kidney disease, stage 3b: Secondary | ICD-10-CM | POA: Diagnosis not present

## 2024-05-03 ENCOUNTER — Other Ambulatory Visit: Payer: Self-pay

## 2024-05-03 DIAGNOSIS — D649 Anemia, unspecified: Secondary | ICD-10-CM | POA: Diagnosis not present

## 2024-05-03 DIAGNOSIS — R790 Abnormal level of blood mineral: Secondary | ICD-10-CM | POA: Diagnosis not present

## 2024-05-03 DIAGNOSIS — R55 Syncope and collapse: Secondary | ICD-10-CM | POA: Diagnosis not present

## 2024-05-03 DIAGNOSIS — I129 Hypertensive chronic kidney disease with stage 1 through stage 4 chronic kidney disease, or unspecified chronic kidney disease: Secondary | ICD-10-CM | POA: Diagnosis not present

## 2024-05-03 DIAGNOSIS — N1832 Chronic kidney disease, stage 3b: Secondary | ICD-10-CM | POA: Diagnosis not present

## 2024-05-03 DIAGNOSIS — I480 Paroxysmal atrial fibrillation: Secondary | ICD-10-CM | POA: Diagnosis not present

## 2024-05-04 ENCOUNTER — Other Ambulatory Visit: Payer: Self-pay

## 2024-05-04 DIAGNOSIS — D649 Anemia, unspecified: Secondary | ICD-10-CM | POA: Diagnosis not present

## 2024-05-23 ENCOUNTER — Other Ambulatory Visit: Payer: Self-pay

## 2024-05-24 ENCOUNTER — Other Ambulatory Visit (HOSPITAL_COMMUNITY): Payer: Self-pay

## 2024-05-24 ENCOUNTER — Other Ambulatory Visit: Payer: Self-pay

## 2024-05-24 MED ORDER — COLCHICINE 0.6 MG PO TABS
ORAL_TABLET | ORAL | 1 refills | Status: AC
  Filled 2024-05-24: qty 30, 15d supply, fill #0

## 2024-05-29 DIAGNOSIS — I129 Hypertensive chronic kidney disease with stage 1 through stage 4 chronic kidney disease, or unspecified chronic kidney disease: Secondary | ICD-10-CM | POA: Diagnosis not present

## 2024-05-29 DIAGNOSIS — N1832 Chronic kidney disease, stage 3b: Secondary | ICD-10-CM | POA: Diagnosis not present

## 2024-05-29 DIAGNOSIS — I428 Other cardiomyopathies: Secondary | ICD-10-CM | POA: Diagnosis not present

## 2024-06-01 ENCOUNTER — Other Ambulatory Visit: Payer: Self-pay

## 2024-06-05 ENCOUNTER — Other Ambulatory Visit: Payer: Self-pay

## 2024-06-06 ENCOUNTER — Other Ambulatory Visit: Payer: Self-pay

## 2024-06-07 ENCOUNTER — Other Ambulatory Visit: Payer: Self-pay

## 2024-06-29 ENCOUNTER — Other Ambulatory Visit: Payer: Self-pay

## 2024-06-29 DIAGNOSIS — I428 Other cardiomyopathies: Secondary | ICD-10-CM | POA: Diagnosis not present

## 2024-06-29 DIAGNOSIS — N1832 Chronic kidney disease, stage 3b: Secondary | ICD-10-CM | POA: Diagnosis not present

## 2024-06-29 DIAGNOSIS — I129 Hypertensive chronic kidney disease with stage 1 through stage 4 chronic kidney disease, or unspecified chronic kidney disease: Secondary | ICD-10-CM | POA: Diagnosis not present

## 2024-07-06 DIAGNOSIS — M109 Gout, unspecified: Secondary | ICD-10-CM | POA: Diagnosis not present

## 2024-07-06 DIAGNOSIS — M79675 Pain in left toe(s): Secondary | ICD-10-CM | POA: Diagnosis not present

## 2024-07-06 DIAGNOSIS — M7989 Other specified soft tissue disorders: Secondary | ICD-10-CM | POA: Diagnosis not present

## 2024-07-10 ENCOUNTER — Other Ambulatory Visit: Payer: Self-pay

## 2024-07-11 ENCOUNTER — Other Ambulatory Visit: Payer: Self-pay

## 2024-07-12 ENCOUNTER — Other Ambulatory Visit: Payer: Self-pay

## 2024-07-13 ENCOUNTER — Other Ambulatory Visit: Payer: Self-pay

## 2024-07-16 ENCOUNTER — Other Ambulatory Visit: Payer: Self-pay

## 2024-07-17 ENCOUNTER — Ambulatory Visit

## 2024-07-17 ENCOUNTER — Ambulatory Visit: Admitting: Podiatry

## 2024-07-17 VITALS — Ht 69.0 in | Wt 250.0 lb

## 2024-07-17 DIAGNOSIS — M21612 Bunion of left foot: Secondary | ICD-10-CM

## 2024-07-17 DIAGNOSIS — M21619 Bunion of unspecified foot: Secondary | ICD-10-CM

## 2024-07-17 DIAGNOSIS — M10472 Other secondary gout, left ankle and foot: Secondary | ICD-10-CM | POA: Diagnosis not present

## 2024-07-17 NOTE — Patient Instructions (Signed)
  VISIT SUMMARY: You came in today because of pain in your right big toe, which is a recurring issue for you due to gout. We discussed your symptoms, reviewed your recent x-rays, and provided treatment to help manage your pain.  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 Your recent x-ray did not show significant changes, but it did reveal some soft tissue swelling. To help manage your pain, we administered a corticosteroid injection to the affected joint. Additionally, we recommend wearing shoes that are not too tight to avoid further irritation.  INSTRUCTIONS: Please monitor your symptoms and avoid wearing tight shoes. If the pain persists or worsens, schedule a follow-up appointment. Continue taking your current medications as prescribed, and let us  know if you feel they need adjustment.                      Contains text generated by Abridge.                                 Contains text generated by Abridge.

## 2024-07-18 ENCOUNTER — Encounter: Payer: Self-pay | Admitting: Emergency Medicine

## 2024-07-18 ENCOUNTER — Other Ambulatory Visit (HOSPITAL_COMMUNITY): Payer: Self-pay

## 2024-07-18 ENCOUNTER — Other Ambulatory Visit (HOSPITAL_BASED_OUTPATIENT_CLINIC_OR_DEPARTMENT_OTHER): Payer: Self-pay

## 2024-07-18 ENCOUNTER — Ambulatory Visit: Admission: EM | Admit: 2024-07-18 | Discharge: 2024-07-18 | Disposition: A | Discharge status: Home / Self Care

## 2024-07-18 ENCOUNTER — Other Ambulatory Visit: Payer: Self-pay

## 2024-07-18 DIAGNOSIS — M25569 Pain in unspecified knee: Secondary | ICD-10-CM | POA: Insufficient documentation

## 2024-07-18 DIAGNOSIS — S90129A Contusion of unspecified lesser toe(s) without damage to nail, initial encounter: Secondary | ICD-10-CM | POA: Insufficient documentation

## 2024-07-18 DIAGNOSIS — D6869 Other thrombophilia: Secondary | ICD-10-CM | POA: Insufficient documentation

## 2024-07-18 DIAGNOSIS — J40 Bronchitis, not specified as acute or chronic: Secondary | ICD-10-CM | POA: Diagnosis not present

## 2024-07-18 DIAGNOSIS — Z1211 Encounter for screening for malignant neoplasm of colon: Secondary | ICD-10-CM | POA: Insufficient documentation

## 2024-07-18 DIAGNOSIS — N5314 Retrograde ejaculation: Secondary | ICD-10-CM | POA: Insufficient documentation

## 2024-07-18 DIAGNOSIS — K2930 Chronic superficial gastritis without bleeding: Secondary | ICD-10-CM | POA: Insufficient documentation

## 2024-07-18 DIAGNOSIS — K219 Gastro-esophageal reflux disease without esophagitis: Secondary | ICD-10-CM | POA: Insufficient documentation

## 2024-07-18 DIAGNOSIS — J329 Chronic sinusitis, unspecified: Secondary | ICD-10-CM | POA: Diagnosis not present

## 2024-07-18 DIAGNOSIS — M545 Low back pain, unspecified: Secondary | ICD-10-CM | POA: Insufficient documentation

## 2024-07-18 DIAGNOSIS — N281 Cyst of kidney, acquired: Secondary | ICD-10-CM | POA: Insufficient documentation

## 2024-07-18 DIAGNOSIS — R809 Proteinuria, unspecified: Secondary | ICD-10-CM | POA: Insufficient documentation

## 2024-07-18 DIAGNOSIS — J13 Pneumonia due to Streptococcus pneumoniae: Secondary | ICD-10-CM | POA: Insufficient documentation

## 2024-07-18 DIAGNOSIS — R6882 Decreased libido: Secondary | ICD-10-CM | POA: Insufficient documentation

## 2024-07-18 DIAGNOSIS — K644 Residual hemorrhoidal skin tags: Secondary | ICD-10-CM | POA: Insufficient documentation

## 2024-07-18 DIAGNOSIS — I700 Atherosclerosis of aorta: Secondary | ICD-10-CM | POA: Insufficient documentation

## 2024-07-18 DIAGNOSIS — R140 Abdominal distension (gaseous): Secondary | ICD-10-CM | POA: Insufficient documentation

## 2024-07-18 DIAGNOSIS — J181 Lobar pneumonia, unspecified organism: Secondary | ICD-10-CM | POA: Insufficient documentation

## 2024-07-18 DIAGNOSIS — N529 Male erectile dysfunction, unspecified: Secondary | ICD-10-CM | POA: Insufficient documentation

## 2024-07-18 DIAGNOSIS — S8002XA Contusion of left knee, initial encounter: Secondary | ICD-10-CM | POA: Insufficient documentation

## 2024-07-18 DIAGNOSIS — B0229 Other postherpetic nervous system involvement: Secondary | ICD-10-CM | POA: Insufficient documentation

## 2024-07-18 DIAGNOSIS — N400 Enlarged prostate without lower urinary tract symptoms: Secondary | ICD-10-CM | POA: Insufficient documentation

## 2024-07-18 DIAGNOSIS — N4889 Other specified disorders of penis: Secondary | ICD-10-CM | POA: Insufficient documentation

## 2024-07-18 DIAGNOSIS — I420 Dilated cardiomyopathy: Secondary | ICD-10-CM | POA: Insufficient documentation

## 2024-07-18 DIAGNOSIS — K5730 Diverticulosis of large intestine without perforation or abscess without bleeding: Secondary | ICD-10-CM | POA: Insufficient documentation

## 2024-07-18 DIAGNOSIS — M109 Gout, unspecified: Secondary | ICD-10-CM | POA: Insufficient documentation

## 2024-07-18 DIAGNOSIS — R1012 Left upper quadrant pain: Secondary | ICD-10-CM | POA: Insufficient documentation

## 2024-07-18 DIAGNOSIS — E7800 Pure hypercholesterolemia, unspecified: Secondary | ICD-10-CM | POA: Insufficient documentation

## 2024-07-18 DIAGNOSIS — N4830 Priapism, unspecified: Secondary | ICD-10-CM | POA: Insufficient documentation

## 2024-07-18 DIAGNOSIS — R194 Change in bowel habit: Secondary | ICD-10-CM | POA: Insufficient documentation

## 2024-07-18 DIAGNOSIS — Z860100 Personal history of colon polyps, unspecified: Secondary | ICD-10-CM | POA: Insufficient documentation

## 2024-07-18 DIAGNOSIS — J309 Allergic rhinitis, unspecified: Secondary | ICD-10-CM | POA: Insufficient documentation

## 2024-07-18 MED ORDER — BENZONATATE 100 MG PO CAPS
100.0000 mg | ORAL_CAPSULE | Freq: Three times a day (TID) | ORAL | 0 refills | Status: AC | PRN
  Filled 2024-07-18 (×2): qty 30, 10d supply, fill #0

## 2024-07-18 MED ORDER — ALBUTEROL SULFATE HFA 108 (90 BASE) MCG/ACT IN AERS
2.0000 | INHALATION_SPRAY | RESPIRATORY_TRACT | 0 refills | Status: AC | PRN
  Filled 2024-07-18: qty 6.7, 17d supply, fill #0
  Filled 2024-07-18: qty 6.7, 25d supply, fill #0

## 2024-07-18 MED ORDER — GUAIFENESIN ER 600 MG PO TB12
ORAL_TABLET | ORAL | 0 refills | Status: AC
  Filled 2024-07-18 (×2): qty 20, 10d supply, fill #0

## 2024-07-18 NOTE — ED Provider Notes (Addendum)
 [facility]    CSN: [id number] Arrival date & time: 07/18/24  1126      History   Chief Complaint Chief Complaint  Patient presents with   Cough   Nasal Congestion    HPI [name] [name] is a 78 y.o. male.   Pt presents today due to 1 week of nasal drainage and cough productive of clear sputum. Pt states that he has been coughing all night. Pt states that he has been eating and drinking without issue, pt does admit to difficulty breathing when lying supine. Pt denies fever. Pt states that he gets something similar every year.   The history is provided by the patient.  Cough   Past Medical History:  Diagnosis Date   Arthritis    CKD (chronic kidney disease), stage III (HCC)    Coronary artery disease    GERD (gastroesophageal reflux disease)    Gout    Heart murmur    History of TIA (transient ischemic attack)    Hypertension    Inguinal hernia    Left   Kidney cysts    Left   LBBB (left bundle branch block)    Non-ischemic cardiomyopathy (HCC)    Paroxysmal atrial fibrillation (HCC)    Sleep apnea 2018   CPAP occasionally   Stroke [facility])    TIA    Patient Active Problem List   Diagnosis Date Noted   Abdominal bloating 07/18/2024   Acquired thrombophilia 07/18/2024   Allergic rhinitis 07/18/2024   Arteriosclerosis of aorta 07/18/2024   Benign prostatic hyperplasia 07/18/2024   Chronic superficial gastritis 07/18/2024   Change in bowel habits 07/18/2024   Contusion of left knee 07/18/2024   Contusion of toe 07/18/2024   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   Erectile dysfunction 07/18/2024   Esophageal reflux 07/18/2024   External hemorrhoids 07/18/2024   Gout 07/18/2024   History of colonic polyps 07/18/2024   Pain in unspecified knee 07/18/2024   Left upper quadrant abdominal pain 07/18/2024   Low back pain 07/18/2024   Microalbuminuria 07/18/2024   Morbid obesity (HCC) 07/18/2024   Painful erection 07/18/2024   Penile pain  07/18/2024   Pneumococcal pneumonia 07/18/2024   Pneumonia, lobar 07/18/2024   Postherpetic neuralgia 07/18/2024   Pure hypercholesterolemia 07/18/2024   Reduced libido 07/18/2024   Renal cyst 07/18/2024   Retrograde ejaculation 07/18/2024   Dilated cardiomyopathy (HCC) 07/18/2024   Colon cancer screening 07/18/2024   Syncope and collapse 04/26/2024   Anemia 04/26/2024   Essential hypertension 04/26/2024   History of gout 04/26/2024   Chronic sinusitis 12/30/2022   Nasal turbinate hypertrophy 11/17/2021   Left inguinal hernia 11/03/2021   Left bundle branch block 11/03/2021   Stage 3b chronic kidney disease (HCC) 11/03/2021   Preop cardiovascular exam 11/03/2021   Balanitis 12/14/2020   Shortness of breath 02/10/2018   Cardiomyopathy (HCC) 02/10/2018   Abnormal stress test 02/10/2018   Atrial fibrillation (HCC) 02/10/2018    Past Surgical History:  Procedure Laterality Date   COLONOSCOPY     HERNIA REPAIR Right    Inguinal Hernia   NASAL TURBINATE REDUCTION Bilateral 03/30/2023   Procedure: TURBINATE REDUCTION/SUBMUCOSAL RESECTION;  Surgeon: Carlie Clark, MD;  Location: [facility] OR;  Service: ENT;  Laterality: Bilateral;   RIGHT/LEFT HEART CATH AND CORONARY ANGIOGRAPHY N/A 02/10/2018   Procedure: RIGHT/LEFT HEART CATH AND CORONARY ANGIOGRAPHY;  Surgeon: Mady Bruckner, MD;  Location: [facility] INVASIVE CV LAB;  Service: Cardiovascular;  Laterality: N/A;   SINUS ENDO WITH FUSION  Bilateral 03/30/2023   Procedure: BILATERAL ANTERIOR ETHMOIDECTOMY, BILATERAL MAXILLARY ANTROSTOMY, FUSION IMAGE GUIDANCE;  Surgeon: Carlie Clark, MD;  Location: [facility] OR;  Service: ENT;  Laterality: Bilateral;   XI ROBOTIC ASSISTED INGUINAL HERNIA REPAIR WITH MESH Left 01/12/2022   Procedure: XI ROBOTIC ASSISTED LEFT INGUINAL HERNIA REPAIR WITH MESH;  Surgeon: Kinsinger, Herlene Righter, MD;  Location: [facility] ORS;  Service: General;  Laterality: Left;       Home Medications    Prior to Admission medications    Medication Sig Start Date End Date Taking? Authorizing Provider  acetaminophen  (TYLENOL ) 325 MG tablet Take 325 mg by mouth every 6 (six) hours as needed for mild pain or fever.   Yes [provider]  albuterol (VENTOLIN HFA) 108 (90 Base) MCG/ACT inhaler Inhale 2 puffs into the lungs every 4 (four) hours as needed for wheezing or shortness of breath. 07/18/24  Yes Andra Corean BROCKS, PA-C  allopurinol  (ZYLOPRIM ) 300 MG tablet Take 1 tablet (300 mg total) by mouth daily. 02/10/24  Yes   amLODipine  (NORVASC ) 10 MG tablet Take 1 tablet (10 mg total) by mouth in the morning. 02/10/24  Yes   atorvastatin  (LIPITOR) 10 MG tablet Take 1 tablet (10 mg total) by mouth once a week. 02/10/24  Yes   benzonatate (TESSALON) 100 MG capsule Take 1 capsule (100 mg total) by mouth every 8 (eight) hours. 07/18/24  Yes Andra Corean C, PA-C  cetirizine  (ZYRTEC  ALLERGY) 10 MG tablet Take 1 tablet (10 mg total) by mouth daily. 08/02/20  Yes Christopher Savannah, PA-C  colchicine  0.6 MG tablet Take 2 tablets (1.2 mg total) by mouth immediately at onset of gout attack. May repeat dose in 1 hour if needed, then 2 tabs once a day as needed. 08/18/23  Yes   ezetimibe  (ZETIA ) 10 MG tablet Take 1 tablet (10 mg total) by mouth daily. 02/10/24  Yes   gabapentin  (NEURONTIN ) 300 MG capsule Take 1 capsule (300 mg total) by mouth 3 (three) times daily. 02/10/24  Yes   guaiFENesin  (MUCINEX ) 600 MG 12 hr tablet Take 1 tablet (600 mg total) by mouth 2 (two) times daily for 10 days. 07/18/24 07/28/24 Yes Andra Corean C, PA-C  hydrochlorothiazide  (HYDRODIURIL ) 25 MG tablet Take 1 tablet (25 mg total) by mouth in the morning. 02/10/24  Yes   irbesartan  (AVAPRO ) 300 MG tablet Take 1 tablet (300 mg total) by mouth daily. 02/10/24  Yes   metoprolol  succinate (TOPROL  XL) 25 MG 24 hr tablet Take 1 tablet (25 mg total) by mouth daily. 03/26/24  Yes Williams, Evan, PA-C  Multiple Vitamin (MULTIVITAMIN WITH MINERALS) TABS tablet Take 1  tablet by mouth daily.   Yes [provider]  omeprazole  (PRILOSEC) 40 MG capsule Take 1 capsule (40 mg total) by mouth daily. 02/10/24  Yes   ondansetron  (ZOFRAN -ODT) 4 MG disintegrating tablet Take 1 tablet (4 mg total) by mouth every 8 (eight) hours as needed for nausea or vomiting. 10/19/23  Yes Kommor, Madison, MD  Rivaroxaban  (XARELTO ) 15 MG TABS tablet Take 1 tablet (15 mg total) by mouth every evening with supper 02/14/24  Yes Skains, Oneil BROCKS, MD  tamsulosin  (FLOMAX ) 0.4 MG CAPS capsule Take 1 capsule (0.4 mg total) by mouth daily. 02/10/24  Yes   traMADol (ULTRAM) 50 MG tablet Take 50 mg by mouth every 6 (six) hours as needed for severe pain.   Yes [provider]  BISACODYL 5 MG EC tablet Take 20 mg by mouth as directed. Patient not taking:  Reported on 07/18/2024 06/02/23   [provider]  colchicine  0.6 MG tablet Take 2 tablets by mouth Immediately at onset of gout attack. May repeat dose in one hour if needed. Then 2 tablets once a day as needed. 05/24/24     diclofenac  Sodium (VOLTAREN ) 1 % GEL Apply topically twice daily as directed 09/26/23     fluticasone (FLONASE) 50 MCG/ACT nasal spray Place 1 spray into both nostrils at bedtime. Patient not taking: Reported on 07/18/2024    [provider]  ipratropium (ATROVENT) 0.03 % nasal spray Place 2 sprays into both nostrils at bedtime as needed for rhinitis. Patient not taking: Reported on 07/18/2024 02/15/23   [provider]  lidocaine  (LIDODERM ) 5 % Place 1 patch onto the skin daily. Remove & Discard patch within 12 hours or as directed by MD Patient not taking: Reported on 07/18/2024 10/19/23   Kommor, Madison, MD  linaclotide [facility]) 72 MCG capsule Take 72 mcg by mouth daily as needed (Constipation). Patient not taking: Reported on 07/18/2024    [provider]  oxyCODONE  (ROXICODONE ) 5 MG immediate release tablet Take 1 tablet (5 mg total) by mouth every 6 (six) hours as needed for  breakthrough pain. Patient not taking: Reported on 07/18/2024 10/19/23   Kommor, Madison, MD  polyethylene glycol (MIRALAX  / GLYCOLAX ) 17 g packet Take 17 g by mouth daily as needed for moderate constipation. Patient not taking: Reported on 07/17/2024    [provider]  promethazine  (PHENERGAN ) 25 MG tablet 1 tablet(s) by mouth every 12 hrs; Duration: 5 days As needed Patient not taking: Reported on 07/18/2024    [provider]  tadalafil (CIALIS) 5 MG tablet Take 5 mg by mouth daily as needed. Patient not taking: Reported on 07/18/2024 06/16/23   [provider]  tiZANidine (ZANAFLEX) 2 MG tablet 1 tablet at bedtime as needed Orally Once a day Patient not taking: Reported on 07/18/2024 10/25/23   [provider]  witch hazel-glycerin  (TUCKS) pad Apply 1 Application topically as needed for itching. Patient not taking: Reported on 07/18/2024 05/07/23   Henderly, Britni A, PA-C    Family History Family History  Problem Relation Age of Onset   Cancer Mother    Hypertension Mother    Heart disease Father    Hypertension Father    Hypertension Sister    Hypertension Brother     Social History Social History   Tobacco Use   Smoking status: Never   Smokeless tobacco: Never   Tobacco comments:    Quit smoking 1999  Vaping Use   Vaping status: Never Used  Substance Use Topics   Alcohol use: No   Drug use: No     Allergies   Benazepril, Pravastatin, Prednisone , and Valsartan   Review of Systems Review of Systems  Respiratory:  Positive for cough.      Physical Exam Triage Vital Signs ED Triage Vitals  Encounter Vitals Group     BP 07/18/24 1231 (!) 146/80     Girls Systolic BP Percentile --      Girls Diastolic BP Percentile --      Boys Systolic BP Percentile --      Boys Diastolic BP Percentile --      Pulse Rate 07/18/24 1231 68     Resp 07/18/24 1231 18     Temp 07/18/24 1231 98.4 F (36.9 C)     Temp Source 07/18/24 1231  Oral     SpO2 07/18/24 1231 97 %  Weight --      Height --      Head Circumference --      Peak Flow --      Pain Score 07/18/24 1232 0     Pain Loc --      Pain Education --      Exclude from Growth Chart --    No data found.  Updated Vital Signs BP (!) 146/80 (BP Location: Left Arm)   Pulse 68   Temp 98.4 F (36.9 C) (Oral)   Resp 18   SpO2 97%   Visual Acuity Right Eye Distance:   Left Eye Distance:   Bilateral Distance:    Right Eye Near:   Left Eye Near:    Bilateral Near:     Physical Exam Vitals and nursing note reviewed.  Constitutional:      General: He is not in acute distress.    Appearance: Normal appearance. He is not ill-appearing, toxic-appearing or diaphoretic.  Eyes:     General: No scleral icterus. Cardiovascular:     Rate and Rhythm: Normal rate and regular rhythm.     Heart sounds: Normal heart sounds.  Pulmonary:     Effort: Pulmonary effort is normal. No respiratory distress.     Breath sounds: Normal breath sounds. No wheezing or rhonchi.  Skin:    General: Skin is warm.  Neurological:     Mental Status: He is alert and oriented to person, place, and time.  Psychiatric:        Mood and Affect: Mood normal.        Behavior: Behavior normal.      UC Treatments / Results  Labs (all labs ordered are listed, but only abnormal results are displayed) Labs Reviewed - No data to display  EKG   Radiology DG Foot Complete Left Result Date: 07/17/2024 Please see detailed radiograph report in office note.   Procedures Procedures (including critical care time)  Medications Ordered in UC Medications - No data to display  Initial Impression / Assessment and Plan / UC Course  I have reviewed the triage vital signs and the nursing notes.  Pertinent labs & imaging results that were available during my care of the patient were reviewed by me and considered in my medical decision making (see chart for details).     Final Clinical  Impressions(s) / UC Diagnoses   Final diagnoses:  Sinobronchitis   Discharge Instructions   None    ED Prescriptions     Medication Sig Dispense Auth. Provider   benzonatate (TESSALON) 100 MG capsule Take 1 capsule (100 mg total) by mouth every 8 (eight) hours. 30 capsule Andra Corean BROCKS, PA-C   guaiFENesin  (MUCINEX ) 600 MG 12 hr tablet Take 1 tablet (600 mg total) by mouth 2 (two) times daily for 10 days. 20 tablet Andra Corean C, PA-C   albuterol (VENTOLIN HFA) 108 (90 Base) MCG/ACT inhaler Inhale 2 puffs into the lungs every 4 (four) hours as needed for wheezing or shortness of breath. 8 g Andra Corean BROCKS, PA-C      PDMP not reviewed this encounter.   Andra Corean BROCKS, PA-C 07/18/24 1301    Andra Corean BROCKS, PA-C 07/18/24 1301

## 2024-07-18 NOTE — ED Triage Notes (Signed)
 Pt reports productive cough and runny nose x1 week. Pt notes having additional symptoms last week like sore throat that have since resolved. His nose continues to run and cough is not improving. Has taken zyrtec  a few days and some nyquil syrup with some improvement. Notes at night lying flat it can be more difficult to breathe with cough.

## 2024-07-19 ENCOUNTER — Other Ambulatory Visit: Payer: Self-pay

## 2024-07-19 NOTE — Progress Notes (Signed)
  Subjective:  Patient ID: [name] [name], male    DOB: 09/23/1945,  MRN: [id number]  Chief Complaint  Patient presents with   Bunions    Rm 2 Patient is here for left foot bunion pain. Patient previously treated for gout.    Discussed the use of AI scribe software for clinical note transcription with the patient, who gave verbal consent to proceed.  History of Present Illness [name] [name] is a 78 year old male with gout who presents with pain in the right big toe.  He experiences pain localized to the first metatarsophalangeal (MTP) joint of the right big toe, without radiation. The pain is described as occurring 'right there' when touched, indicating a specific area of discomfort. He has had similar episodes in the past, suggesting a recurrent nature of the condition.  He has a history of gout with episodes occurring intermittently. He is uncertain if his current medication needs adjustment. Previous x-rays did not show significant changes compared to recent ones.  No pain in other areas of the foot, including the interphalangeal joint and the underside of the toe. No diabetes.  He mentions having two small cysts and one large cyst on his kidneys, which are filled with fluid. No specific symptoms related to the cysts are discussed.      Objective:    Physical Exam VASCULAR: DP and PT pulse palpable. Foot is warm and well-perfused. Capillary fill time is brisk. DERMATOLOGIC: Normal skin turgor, texture, and temperature. No open lesions, rashes, or ulcerations. NEUROLOGIC: Normal sensation to light touch and pressure. No paresthesias. ORTHOPEDIC: Pain and swelling on left first MTP joint. No hallux valgus deformity on left foot. Limited range of motion of left interphalangeal joint. No pain in left interphalangeal joint. Slight bunion on left big toe    No images are attached to the encounter.    Results RADIOLOGY Left foot radiograph: Degenerative changes of the  hallux interphalangeal joint, no hallux valgus deformity, soft tissue effusion around the first MTP joint (07/17/2024)   Assessment:   1. Acute gout due to other secondary cause involving toe of left foot      Plan:  Patient was evaluated and treated and all questions answered.  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 Recent x-ray shows no significant change from previous imaging but reveals soft tissue effusion. Pain localized to the joint with no radiation. No hallux valgus deformity. Likely residual inflammation from a recent gout flare. - Following discussion of the risks and benefits and consent from the patient, the skin of the left first MTP joint was prepped with Betadine and I administered corticosteroid injection to the left first MTP joint with 20 mg of Kenalog and 5 mg of Marcaine . - Advised wearing shoes that are not too tight to avoid irritation of the bunion.      Return if symptoms worsen or fail to improve.

## 2024-07-23 ENCOUNTER — Other Ambulatory Visit (HOSPITAL_COMMUNITY): Payer: Self-pay

## 2024-07-23 ENCOUNTER — Other Ambulatory Visit: Payer: Self-pay

## 2024-07-23 DIAGNOSIS — R059 Cough, unspecified: Secondary | ICD-10-CM | POA: Diagnosis not present

## 2024-07-23 DIAGNOSIS — I428 Other cardiomyopathies: Secondary | ICD-10-CM | POA: Diagnosis not present

## 2024-07-23 DIAGNOSIS — I129 Hypertensive chronic kidney disease with stage 1 through stage 4 chronic kidney disease, or unspecified chronic kidney disease: Secondary | ICD-10-CM | POA: Diagnosis not present

## 2024-07-23 DIAGNOSIS — B0229 Other postherpetic nervous system involvement: Secondary | ICD-10-CM | POA: Diagnosis not present

## 2024-07-23 DIAGNOSIS — Z1331 Encounter for screening for depression: Secondary | ICD-10-CM | POA: Diagnosis not present

## 2024-07-23 DIAGNOSIS — I480 Paroxysmal atrial fibrillation: Secondary | ICD-10-CM | POA: Diagnosis not present

## 2024-07-23 DIAGNOSIS — M109 Gout, unspecified: Secondary | ICD-10-CM | POA: Diagnosis not present

## 2024-07-23 DIAGNOSIS — N400 Enlarged prostate without lower urinary tract symptoms: Secondary | ICD-10-CM | POA: Diagnosis not present

## 2024-07-23 DIAGNOSIS — Z0000 Encounter for general adult medical examination without abnormal findings: Secondary | ICD-10-CM | POA: Diagnosis not present

## 2024-07-23 DIAGNOSIS — N1832 Chronic kidney disease, stage 3b: Secondary | ICD-10-CM | POA: Diagnosis not present

## 2024-07-23 DIAGNOSIS — K2950 Unspecified chronic gastritis without bleeding: Secondary | ICD-10-CM | POA: Diagnosis not present

## 2024-07-23 DIAGNOSIS — E7800 Pure hypercholesterolemia, unspecified: Secondary | ICD-10-CM | POA: Diagnosis not present

## 2024-07-23 MED ORDER — IRBESARTAN 300 MG PO TABS
300.0000 mg | ORAL_TABLET | Freq: Every day | ORAL | 1 refills | Status: AC
  Filled 2024-08-06: qty 30, 30d supply, fill #0
  Filled 2024-09-07: qty 30, 30d supply, fill #1

## 2024-07-23 MED ORDER — HYDROCHLOROTHIAZIDE 25 MG PO TABS
25.0000 mg | ORAL_TABLET | Freq: Every morning | ORAL | 1 refills | Status: AC
  Filled 2024-08-06: qty 90, 90d supply, fill #0
  Filled 2024-09-07: qty 30, 30d supply, fill #1

## 2024-07-23 MED ORDER — OMEPRAZOLE 40 MG PO CPDR
40.0000 mg | DELAYED_RELEASE_CAPSULE | Freq: Every day | ORAL | 1 refills | Status: AC
  Filled 2024-08-06: qty 30, 30d supply, fill #0
  Filled 2024-09-07: qty 30, 30d supply, fill #1

## 2024-07-23 MED ORDER — AMLODIPINE BESYLATE 10 MG PO TABS
10.0000 mg | ORAL_TABLET | Freq: Every day | ORAL | 1 refills | Status: AC
  Filled 2024-08-06: qty 90, 90d supply, fill #0
  Filled 2024-09-07: qty 30, 30d supply, fill #1

## 2024-07-23 MED ORDER — EZETIMIBE 10 MG PO TABS
10.0000 mg | ORAL_TABLET | Freq: Every day | ORAL | 1 refills | Status: AC
  Filled 2024-08-06: qty 30, 30d supply, fill #0
  Filled 2024-09-07: qty 30, 30d supply, fill #1

## 2024-07-23 MED ORDER — METOPROLOL SUCCINATE ER 25 MG PO TB24
25.0000 mg | ORAL_TABLET | Freq: Every day | ORAL | 1 refills | Status: AC
  Filled 2024-08-06: qty 90, 90d supply, fill #0
  Filled 2024-09-07: qty 30, 30d supply, fill #1

## 2024-07-23 MED ORDER — ATORVASTATIN CALCIUM 10 MG PO TABS
ORAL_TABLET | ORAL | 1 refills | Status: AC
  Filled 2024-08-06: qty 13, 91d supply, fill #0

## 2024-07-23 MED ORDER — GABAPENTIN 300 MG PO CAPS
300.0000 mg | ORAL_CAPSULE | Freq: Three times a day (TID) | ORAL | 1 refills | Status: AC
  Filled 2024-08-06: qty 90, 30d supply, fill #0
  Filled 2024-09-07: qty 90, 30d supply, fill #1

## 2024-07-23 MED ORDER — TAMSULOSIN HCL 0.4 MG PO CAPS
0.4000 mg | ORAL_CAPSULE | Freq: Every day | ORAL | 1 refills | Status: AC
  Filled 2024-08-06: qty 30, 30d supply, fill #0
  Filled 2024-09-07: qty 30, 30d supply, fill #1

## 2024-07-23 MED ORDER — HYDROCODONE BIT-HOMATROP MBR 5-1.5 MG/5ML PO SOLN
5.0000 mL | Freq: Four times a day (QID) | ORAL | 0 refills | Status: AC | PRN
  Filled 2024-07-23: qty 100, 5d supply, fill #0

## 2024-07-23 MED ORDER — ALLOPURINOL 300 MG PO TABS
300.0000 mg | ORAL_TABLET | Freq: Every day | ORAL | 1 refills | Status: AC
  Filled 2024-08-06: qty 90, 90d supply, fill #0
  Filled 2024-09-07: qty 30, 30d supply, fill #1

## 2024-07-24 ENCOUNTER — Other Ambulatory Visit (HOSPITAL_COMMUNITY): Payer: Self-pay

## 2024-07-24 ENCOUNTER — Other Ambulatory Visit: Payer: Self-pay

## 2024-07-24 ENCOUNTER — Other Ambulatory Visit: Payer: Self-pay | Admitting: Cardiology

## 2024-07-24 DIAGNOSIS — I480 Paroxysmal atrial fibrillation: Secondary | ICD-10-CM

## 2024-07-24 MED ORDER — RIVAROXABAN 15 MG PO TABS
15.0000 mg | ORAL_TABLET | Freq: Every evening | ORAL | 5 refills | Status: AC
  Filled 2024-08-06: qty 30, 30d supply, fill #0
  Filled 2024-09-07: qty 30, 30d supply, fill #1

## 2024-07-24 NOTE — Telephone Encounter (Signed)
 Xarelto  20mg  refill request received. Pt is 78 years old, weight-113.4kg, Crea- 2.05 on 04/27/24, last seen by Artist Pouch on 03/26/24, Diagnosis-Afib, CrCl- 49.17 mL/min; Dose is inappropriate based on dosing criteria. Will send in refill request to requested pharmacy.

## 2024-07-25 ENCOUNTER — Other Ambulatory Visit: Payer: Self-pay

## 2024-07-25 ENCOUNTER — Other Ambulatory Visit (HOSPITAL_COMMUNITY): Payer: Self-pay

## 2024-07-29 DIAGNOSIS — I428 Other cardiomyopathies: Secondary | ICD-10-CM | POA: Diagnosis not present

## 2024-07-29 DIAGNOSIS — I129 Hypertensive chronic kidney disease with stage 1 through stage 4 chronic kidney disease, or unspecified chronic kidney disease: Secondary | ICD-10-CM | POA: Diagnosis not present

## 2024-07-29 DIAGNOSIS — N1832 Chronic kidney disease, stage 3b: Secondary | ICD-10-CM | POA: Diagnosis not present

## 2024-07-31 ENCOUNTER — Other Ambulatory Visit (HOSPITAL_COMMUNITY): Payer: Self-pay

## 2024-07-31 DIAGNOSIS — R059 Cough, unspecified: Secondary | ICD-10-CM | POA: Diagnosis not present

## 2024-07-31 DIAGNOSIS — J069 Acute upper respiratory infection, unspecified: Secondary | ICD-10-CM | POA: Diagnosis not present

## 2024-07-31 MED ORDER — AZITHROMYCIN 250 MG PO TABS
ORAL_TABLET | ORAL | 0 refills | Status: AC
  Filled 2024-07-31: qty 6, 5d supply, fill #0

## 2024-07-31 MED ORDER — PROMETHAZINE-DM 6.25-15 MG/5ML PO SYRP
5.0000 mL | ORAL_SOLUTION | Freq: Every evening | ORAL | 0 refills | Status: AC | PRN
  Filled 2024-07-31: qty 58, 11d supply, fill #0

## 2024-08-06 ENCOUNTER — Other Ambulatory Visit: Payer: Self-pay

## 2024-08-06 ENCOUNTER — Other Ambulatory Visit (HOSPITAL_COMMUNITY): Payer: Self-pay

## 2024-08-06 DIAGNOSIS — Z23 Encounter for immunization: Secondary | ICD-10-CM | POA: Diagnosis not present

## 2024-08-07 ENCOUNTER — Other Ambulatory Visit (HOSPITAL_COMMUNITY): Payer: Self-pay

## 2024-08-07 ENCOUNTER — Other Ambulatory Visit: Payer: Self-pay

## 2024-08-08 ENCOUNTER — Other Ambulatory Visit: Payer: Self-pay

## 2024-08-09 ENCOUNTER — Other Ambulatory Visit: Payer: Self-pay

## 2024-08-10 ENCOUNTER — Encounter: Payer: Self-pay | Admitting: Cardiology

## 2024-08-13 ENCOUNTER — Other Ambulatory Visit (HOSPITAL_COMMUNITY): Payer: Self-pay | Admitting: Family Medicine

## 2024-08-13 DIAGNOSIS — R4701 Aphasia: Secondary | ICD-10-CM

## 2024-08-20 ENCOUNTER — Ambulatory Visit (HOSPITAL_COMMUNITY)
Admission: RE | Admit: 2024-08-20 | Discharge: 2024-08-20 | Disposition: A | Discharge status: Home / Self Care | Payer: Self-pay | Source: Ambulatory Visit | Attending: Family Medicine | Admitting: Family Medicine

## 2024-08-20 DIAGNOSIS — R4701 Aphasia: Secondary | ICD-10-CM | POA: Insufficient documentation

## 2024-09-07 ENCOUNTER — Other Ambulatory Visit: Payer: Self-pay

## 2024-09-10 ENCOUNTER — Other Ambulatory Visit (HOSPITAL_COMMUNITY): Payer: Self-pay

## 2024-11-26 ENCOUNTER — Ambulatory Visit: Admitting: Neurology
